# Patient Record
Sex: Female | Born: 1965 | Race: White | Hispanic: No | Marital: Married | Smoking: Never smoker
Health system: Southern US, Community
[De-identification: ages and names within clinical notes are randomized; demographics above are authoritative.]

## PROBLEM LIST (undated history)

## (undated) DIAGNOSIS — R4189 Other symptoms and signs involving cognitive functions and awareness: Secondary | ICD-10-CM

## (undated) DIAGNOSIS — Z87898 Personal history of other specified conditions: Secondary | ICD-10-CM

## (undated) DIAGNOSIS — G8929 Other chronic pain: Secondary | ICD-10-CM

## (undated) DIAGNOSIS — G43909 Migraine, unspecified, not intractable, without status migrainosus: Secondary | ICD-10-CM

## (undated) DIAGNOSIS — K649 Unspecified hemorrhoids: Secondary | ICD-10-CM

## (undated) DIAGNOSIS — D869 Sarcoidosis, unspecified: Secondary | ICD-10-CM

## (undated) DIAGNOSIS — C8590 Non-Hodgkin lymphoma, unspecified, unspecified site: Secondary | ICD-10-CM

## (undated) DIAGNOSIS — F419 Anxiety disorder, unspecified: Secondary | ICD-10-CM

## (undated) DIAGNOSIS — F488 Other specified nonpsychotic mental disorders: Secondary | ICD-10-CM

## (undated) HISTORY — DX: Personal history of other specified conditions: Z87.898

## (undated) HISTORY — PX: PERINEOPLASTY: SHX2218

## (undated) HISTORY — DX: Unspecified hemorrhoids: K64.9

## (undated) HISTORY — DX: Anxiety disorder, unspecified: F41.9

## (undated) HISTORY — PX: THORACOSCOPY: SUR1347

## (undated) HISTORY — PX: DILATION AND CURETTAGE OF UTERUS: SHX78

## (undated) HISTORY — DX: Migraine, unspecified, not intractable, without status migrainosus: G43.909

## (undated) HISTORY — DX: Other chronic pain: G89.29

## (undated) HISTORY — PX: WISDOM TOOTH EXTRACTION: SHX21

## (undated) HISTORY — DX: Other symptoms and signs involving cognitive functions and awareness: R41.89

## (undated) HISTORY — PX: CHOLECYSTECTOMY: SHX55

## (undated) HISTORY — PX: TONSILLECTOMY AND ADENOIDECTOMY: SUR1326

---
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
## 2003-05-24 ENCOUNTER — Ambulatory Visit (HOSPITAL_COMMUNITY): Admission: RE | Admit: 2003-05-24 | Discharge: 2003-05-24 | Payer: Self-pay | Admitting: Otolaryngology

## 2003-08-24 ENCOUNTER — Other Ambulatory Visit: Admission: RE | Admit: 2003-08-24 | Discharge: 2003-08-24 | Payer: Self-pay | Admitting: Obstetrics and Gynecology

## 2003-12-15 ENCOUNTER — Ambulatory Visit (HOSPITAL_COMMUNITY): Admission: RE | Admit: 2003-12-15 | Discharge: 2003-12-15 | Payer: Self-pay | Admitting: Neurology

## 2004-07-15 ENCOUNTER — Emergency Department (HOSPITAL_COMMUNITY): Admission: EM | Admit: 2004-07-15 | Discharge: 2004-07-15 | Payer: Self-pay | Admitting: Family Medicine

## 2005-04-13 ENCOUNTER — Other Ambulatory Visit: Admission: RE | Admit: 2005-04-13 | Discharge: 2005-04-13 | Payer: Self-pay | Admitting: Obstetrics and Gynecology

## 2005-05-28 ENCOUNTER — Ambulatory Visit (HOSPITAL_BASED_OUTPATIENT_CLINIC_OR_DEPARTMENT_OTHER): Admission: RE | Admit: 2005-05-28 | Discharge: 2005-05-28 | Payer: Self-pay | Admitting: Internal Medicine

## 2005-06-04 ENCOUNTER — Ambulatory Visit: Payer: Self-pay | Admitting: Internal Medicine

## 2005-07-20 ENCOUNTER — Ambulatory Visit: Payer: Self-pay | Admitting: Pulmonary Disease

## 2005-09-09 ENCOUNTER — Ambulatory Visit (HOSPITAL_BASED_OUTPATIENT_CLINIC_OR_DEPARTMENT_OTHER): Admission: RE | Admit: 2005-09-09 | Discharge: 2005-09-09 | Payer: Self-pay | Admitting: Pulmonary Disease

## 2005-09-23 ENCOUNTER — Ambulatory Visit: Payer: Self-pay | Admitting: Pulmonary Disease

## 2005-09-27 ENCOUNTER — Ambulatory Visit: Payer: Self-pay | Admitting: Pulmonary Disease

## 2005-11-19 ENCOUNTER — Emergency Department (HOSPITAL_COMMUNITY): Admission: EM | Admit: 2005-11-19 | Discharge: 2005-11-19 | Payer: Self-pay | Admitting: Family Medicine

## 2005-11-26 ENCOUNTER — Ambulatory Visit: Payer: Self-pay | Admitting: Pulmonary Disease

## 2006-02-26 ENCOUNTER — Ambulatory Visit: Payer: Self-pay | Admitting: Pulmonary Disease

## 2006-03-27 ENCOUNTER — Ambulatory Visit: Payer: Self-pay | Admitting: Pulmonary Disease

## 2006-05-13 ENCOUNTER — Ambulatory Visit (HOSPITAL_COMMUNITY): Admission: RE | Admit: 2006-05-13 | Discharge: 2006-05-13 | Payer: Self-pay | Admitting: Neurosurgery

## 2006-06-11 HISTORY — PX: SHOULDER ARTHROSCOPY: SHX128

## 2006-07-02 ENCOUNTER — Encounter: Admission: RE | Admit: 2006-07-02 | Discharge: 2006-07-02 | Payer: Self-pay | Admitting: Obstetrics and Gynecology

## 2006-12-18 ENCOUNTER — Ambulatory Visit: Payer: Self-pay | Admitting: Pulmonary Disease

## 2006-12-18 LAB — CONVERTED CEMR LAB
ALT: 15 units/L (ref 0–35)
AST: 17 units/L (ref 0–37)
Albumin: 3.7 g/dL (ref 3.5–5.2)
Alkaline Phosphatase: 38 units/L — ABNORMAL LOW (ref 39–117)
Angiotensin 1 Converting Enzyme: 26 units/L (ref 9–67)
BUN: 9 mg/dL (ref 6–23)
Basophils Absolute: 0 10*3/uL (ref 0.0–0.1)
Basophils Relative: 0.3 % (ref 0.0–1.0)
Bilirubin, Direct: 0.1 mg/dL (ref 0.0–0.3)
CO2: 30 meq/L (ref 19–32)
Calcium: 9.8 mg/dL (ref 8.4–10.5)
Chloride: 105 meq/L (ref 96–112)
Creatinine, Ser: 0.7 mg/dL (ref 0.4–1.2)
Eosinophils Absolute: 0.1 10*3/uL (ref 0.0–0.6)
Eosinophils Relative: 0.8 % (ref 0.0–5.0)
Ferritin: 37.6 ng/mL (ref 10.0–291.0)
GFR calc Af Amer: 119 mL/min
GFR calc non Af Amer: 99 mL/min
Glucose, Bld: 102 mg/dL — ABNORMAL HIGH (ref 70–99)
HCT: 41.6 % (ref 36.0–46.0)
Hemoglobin: 14.3 g/dL (ref 12.0–15.0)
Iron: 99 ug/dL (ref 42–145)
Lymphocytes Relative: 26 % (ref 12.0–46.0)
MCHC: 34.3 g/dL (ref 30.0–36.0)
MCV: 88.3 fL (ref 78.0–100.0)
Monocytes Absolute: 0.6 10*3/uL (ref 0.2–0.7)
Monocytes Relative: 7.9 % (ref 3.0–11.0)
Neutro Abs: 4.9 10*3/uL (ref 1.4–7.7)
Neutrophils Relative %: 65 % (ref 43.0–77.0)
Platelets: 240 10*3/uL (ref 150–400)
Potassium: 3.4 meq/L — ABNORMAL LOW (ref 3.5–5.1)
RBC: 4.7 M/uL (ref 3.87–5.11)
RDW: 11.8 % (ref 11.5–14.6)
Saturation Ratios: 17.7 % — ABNORMAL LOW (ref 20.0–50.0)
Sodium: 136 meq/L (ref 135–145)
TSH: 1.51 microintl units/mL (ref 0.35–5.50)
Total Bilirubin: 0.8 mg/dL (ref 0.3–1.2)
Total Protein: 7.2 g/dL (ref 6.0–8.3)
Transferrin: 399.4 mg/dL — ABNORMAL HIGH (ref >=212.0)
WBC: 7.6 10*3/uL (ref 4.5–10.5)

## 2007-02-07 ENCOUNTER — Ambulatory Visit: Payer: Self-pay | Admitting: Gastroenterology

## 2007-02-12 ENCOUNTER — Ambulatory Visit: Payer: Self-pay | Admitting: Cardiovascular Disease

## 2007-02-13 ENCOUNTER — Ambulatory Visit: Payer: Self-pay | Admitting: Gastroenterology

## 2007-02-18 ENCOUNTER — Ambulatory Visit: Payer: Self-pay | Admitting: Gastroenterology

## 2007-03-10 ENCOUNTER — Ambulatory Visit: Payer: Self-pay | Admitting: Gastroenterology

## 2007-03-11 ENCOUNTER — Encounter: Payer: Self-pay | Admitting: Gastroenterology

## 2007-03-11 ENCOUNTER — Ambulatory Visit: Payer: Self-pay | Admitting: Gastroenterology

## 2007-03-18 ENCOUNTER — Ambulatory Visit: Payer: Self-pay | Admitting: Gastroenterology

## 2007-03-19 ENCOUNTER — Ambulatory Visit: Payer: Self-pay | Admitting: Gastroenterology

## 2007-03-19 ENCOUNTER — Encounter: Payer: Self-pay | Admitting: Gastroenterology

## 2007-03-27 ENCOUNTER — Encounter (INDEPENDENT_AMBULATORY_CARE_PROVIDER_SITE_OTHER): Payer: Self-pay | Admitting: Interventional Radiology

## 2007-03-27 ENCOUNTER — Ambulatory Visit (HOSPITAL_COMMUNITY): Admission: RE | Admit: 2007-03-27 | Discharge: 2007-03-27 | Payer: Self-pay | Admitting: Gastroenterology

## 2007-04-02 DIAGNOSIS — D869 Sarcoidosis, unspecified: Secondary | ICD-10-CM

## 2007-04-02 DIAGNOSIS — G4700 Insomnia, unspecified: Secondary | ICD-10-CM

## 2007-04-02 DIAGNOSIS — G2581 Restless legs syndrome: Secondary | ICD-10-CM

## 2007-04-29 ENCOUNTER — Ambulatory Visit: Payer: Self-pay | Admitting: Gastroenterology

## 2007-06-13 ENCOUNTER — Ambulatory Visit: Payer: Self-pay | Admitting: Gastroenterology

## 2007-06-13 DIAGNOSIS — R112 Nausea with vomiting, unspecified: Secondary | ICD-10-CM

## 2007-07-11 DIAGNOSIS — K5909 Other constipation: Secondary | ICD-10-CM | POA: Insufficient documentation

## 2007-07-11 DIAGNOSIS — R1314 Dysphagia, pharyngoesophageal phase: Secondary | ICD-10-CM

## 2007-08-08 ENCOUNTER — Ambulatory Visit (HOSPITAL_COMMUNITY): Admission: RE | Admit: 2007-08-08 | Discharge: 2007-08-08 | Payer: Self-pay | Admitting: Obstetrics and Gynecology

## 2007-10-10 ENCOUNTER — Ambulatory Visit (HOSPITAL_COMMUNITY): Admission: RE | Admit: 2007-10-10 | Discharge: 2007-10-10 | Payer: Self-pay | Admitting: Neurosurgery

## 2007-12-22 ENCOUNTER — Ambulatory Visit (HOSPITAL_COMMUNITY): Admission: RE | Admit: 2007-12-22 | Discharge: 2007-12-22 | Payer: Self-pay | Admitting: Orthopedic Surgery

## 2008-03-11 ENCOUNTER — Ambulatory Visit (HOSPITAL_COMMUNITY): Admission: RE | Admit: 2008-03-11 | Discharge: 2008-03-11 | Payer: Self-pay | Admitting: Orthopedic Surgery

## 2008-03-26 ENCOUNTER — Encounter: Admission: RE | Admit: 2008-03-26 | Discharge: 2008-05-11 | Payer: Self-pay | Admitting: Orthopedic Surgery

## 2008-05-21 ENCOUNTER — Ambulatory Visit (HOSPITAL_COMMUNITY): Admission: RE | Admit: 2008-05-21 | Discharge: 2008-05-21 | Payer: Self-pay | Admitting: Neurosurgery

## 2008-06-18 ENCOUNTER — Ambulatory Visit (HOSPITAL_COMMUNITY): Admission: RE | Admit: 2008-06-18 | Discharge: 2008-06-18 | Payer: Self-pay | Admitting: Internal Medicine

## 2008-07-27 ENCOUNTER — Ambulatory Visit (HOSPITAL_COMMUNITY): Admission: RE | Admit: 2008-07-27 | Discharge: 2008-07-27 | Payer: Self-pay | Admitting: Orthopedic Surgery

## 2008-08-05 ENCOUNTER — Ambulatory Visit (HOSPITAL_COMMUNITY): Admission: RE | Admit: 2008-08-05 | Discharge: 2008-08-05 | Payer: Self-pay | Admitting: Neurosurgery

## 2008-08-09 ENCOUNTER — Encounter: Admission: RE | Admit: 2008-08-09 | Discharge: 2008-08-09 | Payer: Self-pay | Admitting: Neurosurgery

## 2008-12-27 ENCOUNTER — Ambulatory Visit (HOSPITAL_COMMUNITY): Admission: RE | Admit: 2008-12-27 | Discharge: 2008-12-27 | Payer: Self-pay | Admitting: Neurology

## 2009-03-16 ENCOUNTER — Encounter: Admission: RE | Admit: 2009-03-16 | Discharge: 2009-03-16 | Payer: Self-pay | Admitting: Internal Medicine

## 2010-07-02 ENCOUNTER — Encounter: Payer: Self-pay | Admitting: Obstetrics and Gynecology

## 2010-07-02 ENCOUNTER — Encounter: Payer: Self-pay | Admitting: Orthopedic Surgery

## 2010-07-03 ENCOUNTER — Encounter: Payer: Self-pay | Admitting: Gastroenterology

## 2010-08-25 ENCOUNTER — Other Ambulatory Visit: Payer: Self-pay | Admitting: Obstetrics and Gynecology

## 2010-08-25 DIAGNOSIS — Z1231 Encounter for screening mammogram for malignant neoplasm of breast: Secondary | ICD-10-CM

## 2010-09-06 ENCOUNTER — Ambulatory Visit: Payer: Self-pay

## 2010-09-11 ENCOUNTER — Ambulatory Visit: Payer: Self-pay

## 2010-09-21 ENCOUNTER — Ambulatory Visit
Admission: RE | Admit: 2010-09-21 | Discharge: 2010-09-21 | Disposition: A | Discharge status: Home / Self Care | Payer: Commercial Managed Care - PPO | Source: Ambulatory Visit | Attending: Obstetrics and Gynecology | Admitting: Obstetrics and Gynecology

## 2010-09-21 DIAGNOSIS — Z1231 Encounter for screening mammogram for malignant neoplasm of breast: Secondary | ICD-10-CM

## 2010-10-24 NOTE — Discharge Summary (Signed)
NAME:  [name] [name], [name] [name]            ACCOUNT NO.:  [id number]   MEDICAL RECORD NO.:  [id number]          PATIENT TYPE:  AMB   LOCATION:  SDC                           FACILITY:  [facility]   PHYSICIAN:  Lenoard Aden, M.D.DATE OF BIRTH:  Jul 25, 1965   DATE OF ADMISSION:  08/08/2007  DATE OF DISCHARGE:  08/08/2007                               DISCHARGE SUMMARY   ADMISSION DIAGNOSES:  Vulvar scarring, secondary dyspareunia, vaginal  stenosis.   POSTOPERATIVE DIAGNOSES:  Vulvar scarring, secondary dyspareunia,  vaginal stenosis.   HOSPITAL COURSE:  The patient underwent uncomplicated vulvoplasty with  perineorrhaphy and vaginoplasty.  Subsequent to termination of the  procedure upon leaving the operating room, I was called back to the  operating room for persistent bleeding from the left lateral portion of  the vaginoplasty repair.  At this time, it was noted that there was a  small hematoma forming beneath.  This was appropriately repaired after  removing sutures on the left side and re-closing the external portion of  the left portion of the vaginal vulvoplasty procedure.  At this time,  good hemostasis was noted.  General anesthesia was then discontinued.  The patient tolerated the procedure well and was transferred to recovery  in good condition.  No complications noted.      Lenoard Aden, M.D.  Electronically Signed     RJT/MEDQ  D:  09/28/2007  T:  09/29/2007  Job:  [id number]   cc:   [facility]  [address]  [place], [place] [address]

## 2010-10-24 NOTE — Assessment & Plan Note (Signed)
[facility]                         GASTROENTEROLOGY OFFICE NOTE   NAME:[name] [name], [name] [name]                   MRN:          [id number]  DATE:04/29/2007                            DOB:          1965/10/13    PRIMARY CARE PHYSICIAN:  Gaspar Garbe, M.D.   GI PROBLEM LIST:  :  Nausea, vomiting, intermittent dysphagia, globus  sensation.  This constellation of symptoms dates back many years, where  she was evaluated by an outside gastroenterologist with pH testing,  esophageal manometry, EGDs, trials of multiple PPIs.  None helped.  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  A tTG was  normal.  Angiotensin-converting enzyme was normal.  CT scan with IV and  oral contrast suggested mild thickening of the gastric antrum and  hepatomegaly (liver measuring up to 20 cm).  Flex sig with rectal  biopsies to check for amyloidosis were negative.  EGD of March 11, 2007 was normal.  Biopsies of the duodenum were normal.  Core liver  biopsy of very large liver showed minimal portal inflammation.  No  granulomas.  Essentially fairly nonspecific findings.   INTERVAL HISTORY:  I last saw [name] [name] a month and a half ago.  Since  then, she states that many of her symptoms seem to be improving.  She  has recently started on Zoloft and believes that is really helping her  out with a lot of work stress and life stressors.  She does have a  rather chronic constipation, which has been an issue since she was a  young girl.  She vomits about once a week.   PHYSICAL EXAMINATION:  Weight 95 pounds, which is up 4 pounds since her  last visit.  Blood pressure 80/56, pulse 72.  CONSTITUTIONAL:  Think otherwise well-appearing.  ABDOMEN:  Soft, nontender, nondistended.  Normal bowel sounds.   ASSESSMENT/PLAN:  A 45 year old woman with multiple gastrointestinal  symptoms.  Most seem to be resolved, probably functional.   I recommended that she begin  taking MiraLax.  She will start with 3  scoops today, 3 scoops tomorrow, then 1 scoop a day.  She will return to  see me in 6-8 weeks or sooner if needed.  She really feels much better  since starting the Zoloft overall.     [name], MD  Electronically Signed    DPJ/MedQ  DD: 04/29/2007  DT: 04/30/2007  Job #: [id number]   cc:   Gaspar Garbe, M.D.

## 2010-10-24 NOTE — Op Note (Signed)
NAME:  [name] [name], [name] [name]            ACCOUNT NO.:  [id number]   MEDICAL RECORD NO.:  [id number]          PATIENT TYPE:  AMB   LOCATION:  SDS                          FACILITY:  [facility]   PHYSICIAN:  Vania Rea. Supple, M.D.  DATE OF BIRTH:  December 13, 1965   DATE OF PROCEDURE:  03/11/2008  DATE OF DISCHARGE:                               OPERATIVE REPORT   PREOPERATIVE DIAGNOSES:  1. Chronic right shoulder impingement syndrome.  2. 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   POSTOPERATIVE DIAGNOSES:  1. Chronic right shoulder impingement syndrome.  2. 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   PROCEDURES:  1. Right shoulder examination under anesthesia.  2. Right shoulder diagnostic arthroscopy.  3. Arthroscopic subacromial depression.  4. Arthroscopic distal clavicle resection.   SURGEON:  Vania Rea. Supple, MD   ASSISTANT:  Lucita Lora. Shuford, PA-C.   ANESTHESIA:  General endotracheal as well as a preoperative interscalene  block.   ESTIMATED BLOOD LOSS:  Minimal.   DRAINS:  None.   HISTORY:  Mr. Sanzo is 45 year old female whose has had chronic right  shoulder pain with impingement syndrome, symptomatic AC joint arthrosis  with symptoms that had been refractory to prolonged attempts at  conservative management.  Due to her ongoing pain and functional  limitations, she is brought to the operating room at this time for  planned right shoulder arthroscopy as described below.   Preoperatively counseled Ms. Orne on treatment options as well as  risks and benefits thereof.  Possible surgical complications including  infection, neurovascular injury, persistent pain, loss of motion,  anesthetic complication, and possible need for additional surgery are  reviewed.  She understands and accepts and agrees with our planned  procedure.   PROCEDURE IN DETAIL:  After undergoing routine preop evaluation, the  patient received prophylactic  antibiotics.  An interscalene block was  established in the holding area by the Anesthesia Department.  Placed  supine on the operative table and underwent smooth induction of general  endotracheal anesthesia, turned to the left lateral decubitus position  on a beanbag and appropriately padded and protected.  Right  shoulder  examination under anesthesia revealed full passive motion.  There are no  obvious instability pattern.  The right arm was suspended at 70 degrees,  abduction with 10 pounds of traction.  The right shoulder girdle region  was sterilely prepped and draped in the standard fashion.  Posterior  portal was established in the glenohumeral joint and diagnostic  arthroscopy was performed.  The glenohumeral articular surfaces were in  pristine condition.  The rotator cuff was carefully inspected and found  to be intact with no obvious degenerative changes.  There are no  instability patterns noted.  The biceps tendon showed normal caliber and  good quality tissue and no evidence for instability either proximally or  distally.  Fluid and instruments then were removed from glenohumeral  joint.  The arm was dropped down to 30 degrees of abduction and the  arthroscope introduced in the subacromial space from posterior portal,  direct lateral portal was established in the subacromial space.  Abundant proliferative bursal tissue with  multiple adhesions were  identified.  These were divided and excised with a combination of the  shaver and the Arthrex wand.  The wand was then used to remove the  periosteum from the undersurface of the anterior half of the acromion  and then a subacromial decompression was performed with a bur removing a  just very thin margin of bone, 1 or 2 mm from the anterior aspect of the  acromion, creating a type 1 morphology.  We then established a portal  directly anterior to distal clavicle.  Distal clavicle resection was  performed with a bur.  Care was  taken to make sure the entire  circumference.  Distal clavicle could be visualized to assure adequate  removal of bone.  We then completed a subacromial/subdeltoid bursectomy.  The bursal surface of the rotator cuff was inspected and probed.  It is  found to be intact, was very minimal bursal-sided frank.  Final  hemostasis was obtained.  Fluid and instruments were removed.  The  portals was closed with Monocryl and Steri-Strips.  A bulky dry dressing  taped with the right shoulder and arm was placed in a sling immobilizer.  The patient was supine, extubated, and taken to recovery room in stable  condition.      Vania Rea. Supple, M.D.  Electronically Signed     KMS/MEDQ  D:  03/11/2008  T:  03/12/2008  Job:  [id number]

## 2010-10-24 NOTE — Assessment & Plan Note (Signed)
[facility]                         GASTROENTEROLOGY OFFICE NOTE   [name], [name] TOUCH                   MRN:          [id number]  DATE:03/10/2007                            DOB:          1965/08/26    PRIMARY CARE PHYSICIAN:  Dr. Guerry Bruin.   GI PROBLEM LIST:  Nausea, vomiting, intermittent dysphagia, globus  sensation.  This constellation of symptoms dates back many years, where  she was evaluated by an outside gastroenterologist with pH testing,  esophageal manometry, EGDs, trials of multiple PPIs.  None helped.  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  A tTG was  normal.  Angiotensin-converting enzyme was normal.  CT scan with IV and  oral contrast suggested mild thickening of the gastric antrum and  hepatomegaly (liver measuring up to 20 cm).   INTERVAL HISTORY:  I last saw [name] [name] 3-4 weeks ago.  Since then,  she is continuing to have vomiting.  She says coughing usually tends to  bring this on.  She tried stopping Yaz and citalopram but has not  noticed any significant difference after stopping those.   CURRENT MEDICINES:  1. Zoloft.  2. Trazodone.  3. Ambien.  4. Nexium.   PHYSICAL EXAMINATION:  Weight 91.4 pounds, blood pressure 74/48 (we are  rechecking that now), pulse 70.  CONSTITUTIONAL:  Alert and oriented x3.  Generally well appearing  although thin.  ABDOMEN:  Soft, nontender, nondistended, normal bowel sounds.   ASSESSMENT AND PLAN:  A 45 year old woman with vomiting, globus,  constellation of gastrointestinal symptoms, hepatomegaly on recent CT  scan.  Workup has been fairly unrevealing to date.  I will arrange for  her to have an esophagogastroduodenoscopy done in the next 1-2 days.  She does have quite a large liver on recent CT scan (20 cm), and if the  esophagogastroduodenoscopy does not show any obvious cause of her  symptoms, then we will have to consider liver biopsy.  Perhaps she  has  an infiltrative disorder.     [name], MD  Electronically Signed    DPJ/MedQ  DD: 03/10/2007  DT: 03/10/2007  Job #: [id number]   cc:   Gaspar Garbe, M.D.

## 2010-10-24 NOTE — Op Note (Signed)
NAME:  [name] [name], [name] [name]            ACCOUNT NO.:  [id number]   MEDICAL RECORD NO.:  [id number]          PATIENT TYPE:  AMB   LOCATION:  SDC                           FACILITY:  [facility]   PHYSICIAN:  Lenoard Aden, M.D.DATE OF BIRTH:  [birth year]   DATE OF PROCEDURE:  08/08/2007  DATE OF DISCHARGE:                               OPERATIVE REPORT   PREOPERATIVE DIAGNOSES:  1. Vulvar and introital scarring with secondary dyspareunia.  2. Vaginal stenosis.   POSTOPERATIVE DIAGNOSES:  1. Vulvar and introital scarring with secondary dyspareunia.  2. Vaginal stenosis.   PROCEDURE:  Vulvoplasty with perineorrhaphy, vaginoplasty.   SURGEON:  Lenoard Aden, M.D.   ANESTHESIA:  General.   ESTIMATED BLOOD LOSS:  One hundred mL.   COMPLICATIONS:  None.   DRAINS:  None.   COUNTS:  Correct.   DISPOSITION:  The patient to recovery in good condition.   PROCEDURE IN DETAIL:  After being apprised of risks of anesthesia,  infection, bleeding, injury to abdominal organs, need for repair delayed  immediate, complications to include bowel or bladder injury with  possible need for repair, the patient was taken to the operating room  where she was administered general anesthetic without complications,  prepped and draped in usual sterile fashion, catheterized until the  bladder is empty.  After achieving adequate anesthesia, the area of  scarring is outlined.  Vag and Allis clamps were placed bilaterally to  outline to put the midline scarring area in the vulva under tension.  A  V shaped incision is made to excise this midline scar tissue which is  then excised in total.  The vagina is then undermined in the midline  under the rectovaginal space and good hemostasis is noted.  At this time  the deep tissue is closed and a perineorrhaphy is performed using a 3-0  Vicryl Rapide in the standard fashion.  Lateral sutures along the  relaxing incision made in the vulvar area are made using a 3-0  Vicryl  Rapide suture and good hemostasis is noted.  At this time after  repairing the vulvar and perineorrhaphy there is an area of stenosis  along the left lateral vaginal wall which is outlined using Allis clamps  in a cephalad to caudad fashion.  A vertical relaxing incision is made.  The vagina is undermined and this area is then closed in interrupted 3-0  Vicryl Rapide sutures in a transverse fashion using interrupted sutures.  Good hemostasis is noted,  good relaxation of the vaginal stenosis and vaginal scarring is noted  after the procedure.  The patient tolerated the procedure well and is  transferred to recovery room in good condition after placement of a  dilute 0.5% Marcaine solution 10 mL total in the area of the surgery.      Lenoard Aden, M.D.  Electronically Signed     RJT/MEDQ  D:  08/08/2007  T:  08/09/2007  Job:  [id number]

## 2010-10-24 NOTE — Assessment & Plan Note (Signed)
[facility]                             PULMONARY OFFICE NOTE   NAME:Bourn, SHAYNE DEERMAN                   MRN:          [id number]  DATE:12/18/2006                            DOB:          07/20/1965    I saw Ms. Foister in follow up for her sleep onset insomnia, confusional  arousals, restless legs syndrome, and pulmonary sarcoidosis. She says  that since her last visit with me, her sleep has been reasonably okay.  Her main complaint today is that she has been having problems feeling  excessively thirsty, tired during the day, but not actually sleepy. She  has also been having an increase in appetite with increased frequency of  urination. She has also lost approximately 10 pounds from a year ago.  She has occasional chills and has also noticed that she has been having  problems with allergies recently. 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 She says that when she  takes her sleeping medications, if she is not actually in bed, her  husband has noticed that she has done some unusual things that she does  not recall later, but that if she is actually in bed when she takes her  medications, these events do not occur.   CURRENT MEDICATIONS:  1. Yasmin once daily.  2. Lexapro 10 mg daily.  3. Multivitamin daily.  4. Rhinocort nasal spray as needed.  5. Ambien 10 mg nightly.  6. Trazodone 50 mg nightly.  7. Klonopin 0.5 mg as needed.   PHYSICAL EXAMINATION:  VITAL SIGNS:  Weight 96 pounds, temperature 98.8,  blood pressure 94/64, heart rate 97, oxygen saturation 98% on room air.  HEENT:  There was no sinus tenderness, no nasal discharge, no oral  lesions.  NECK:  No lymphadenopathy.  HEART:  S1, S2.  CHEST:  Clear to auscultation.  ABDOMEN:  Thin, soft, nontender.  EXTREMITIES:  No edema.   IMPRESSION:  1. History of  pulmonary sarcoidosis with recurrent symptoms of weight      loss, thirst, fatigue, polyuria, and an increase in appetite in      spite of the weight of loss. I would like to make sure that she is      not having a recurrence of her pulmonary sarcoidosis. I will      arrange for her to undergo a chest x-ray, as well as laboratory      tests and then she says that she will also arrange for a follow up      with Dr. Wylene Simmer to further evaluate this as well.  2. Sleep onset insomnia. She appears to be doing reasonably well on      her current regimen of Trazodone and Ambien and I will continue her      on this.  3. Confusional arousals. Again, this is likely related to the use of      her Ambien. I have advised her to maintain a safe sleep  environment.  4. Restless legs syndrome. She has noticed that her symptoms are      slightly worse. I will have her have a serum ferritin level check      and if this is below 50, then we may need to reinitiate her on      Ferrous sulfate with vitamin C.   I will follow up with her in approximately six weeks.     Coralyn Helling, MD  Electronically Signed    VS/MedQ  DD: 12/22/2006  DT: 12/23/2006  Job #: [id number]   cc:   Gaspar Garbe, M.D.

## 2010-10-24 NOTE — Assessment & Plan Note (Signed)
[facility]                         GASTROENTEROLOGY OFFICE NOTE   NAME:[name] [name], [name] [name]                   MRN:          [id number]  DATE:06/13/2007                            DOB:          06/23/1965    PRIMARY CARE PHYSICIAN:  Gaspar Garbe, M.D.   GI PROBLEM LIST:  1. Nausea, vomiting, intermittent dysphagia, globus sensation.  This      constellation of symptoms dates back many years, where she was      evaluated by an outside gastroenterologist with pH testing,      esophageal manometry, EGDs, trials of multiple PPIs.  None helped.      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  A tTG      was normal.  Angiotensin-converting enzyme was normal.  CT scan      with IV and oral contrast suggested mild thickening of the gastric      antrum and hepatomegaly (liver measuring up to 20 cm).  Flex sig      with rectal biopsies to check for amyloidosis were negative.  EGD      of March 11, 2007 was normal.  Biopsies of the duodenum were      normal.  Core liver biopsy of very large liver showed minimal      portal inflammation.  No granulomas.  Essentially fairly      nonspecific findings.  2. Constipation.  Chronic constipation, improved with MiraLax daily.   INTERVAL HISTORY:  I last saw [name] [name] six weeks ago.  Since then, she  has been on MiraLax daily and has noticed a definite improvement in her  chronic constipation.  She moves her bowels once or twice a day, does  not really have to strain as much as she was before.  It is not a  dramatic improvement, but she definitely feels better.  She still vomits  once every week or two.  She thinks this is usually triggered by certain  smells, sometimes a gagging sensation in the back of her throat.  Overall, her spirits are much improved since she began taking Zoloft two  to three months ago.   PHYSICAL EXAM:  Weight 92.4 pounds, which is down 2.5 pounds since her  last  visit, fairly stable over several months.  Blood pressure 78/50,  pulse 76.  CONSTITUTIONAL:  Thin, otherwise well-appearing.  NEUROLOGIC:  Alert and oriented times three.  ABDOMEN:  Soft, nontender, nondistended.  Normal bowel sounds.   ASSESSMENT AND PLAN:  Patient is a 45 year old woman with multiple GI  symptoms, most seem to be resolved and are likely functional.  She will  stay on MiraLax one to one and a half scoops a day for now.  She knows  to get in touch with me if any new GI issues come up.     [name], MD  Electronically Signed    DPJ/MedQ  DD: 06/13/2007  DT: 06/13/2007  Job #: [id number]   cc:   Gaspar Garbe, M.D.

## 2010-10-24 NOTE — Assessment & Plan Note (Signed)
[facility]                         GASTROENTEROLOGY OFFICE NOTE   [name], [name] [name]                   MRN:          [id number]  DATE:02/07/2007                            DOB:          [birth year]    REFERRING PHYSICIAN:  Gaspar Garbe, M.D.   REASON FOR REFERRAL:  Dr. Wylene Simmer asked me to evaluate [name] [name] in  consultation regarding vomiting, diarrhea, and weight loss.   HISTORY OF PRESENT ILLNESS:  [name] [name] is a very pleasant 45 year old  woman who has a history of pulmonary sarcoid.  She initially presented 7-  8 years ago in [place] with predominantly gastrointestinal type  symptoms.  She had a globus sensation with nausea, vomiting, and some  dysphagia.  She had extensive GI workup including an EGD that she tells  me was normal.  She had a pH study that looks fairly normal to my review  (this was done off medicines).  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  She was tried at that point on multiple PPIs, Reglan, I  believe calcium channel blockers for these nonspecific motility  findings.  Nothing really helped.  She eventually had a chest CT that  revealed mediastinal adenopathy.  This was worked up, and she was found  to have pulmonary sarcoid.  She had a brief course of steroids, and over  time all her various symptoms resolved.  It has been several years since  that time, and over the past 2-3 months she has had a return of many of  these initial GI symptoms of a globus-type sensation, dysphagia.  She  feels a lump in her upper chest, and it causes some coughing and some  vomiting.  She feels a lump usually when she swallows.  This she says is  somewhat similar to her initial sarcoid symptoms.  She has also been  having intermittent diarrhea and has lost at least 10 pounds over  the  past 6-8 weeks.  Her appetite is decreasing.  She does admit she has  quite a stressful job and wonders if this is playing a role.  She has  had recent lab tests one month ago by Dr. Wylene Simmer showing a normal CBC,  a relatively normal complete metabolic profile, normal thyroid testing.   REVIEW OF SYSTEMS:  Notable for the weight loss as above.  Otherwise,  essentially normal and is available on her intake sheet.   PAST MEDICAL HISTORY:  1. History of pulmonary sarcoid, diagnosed in 2001.  Status post      cholecystectomy in 2001.  This was empirically done as part of her      initial workup.  2. Anxiety panic disorder.  3. Elevated cholesterol.   CURRENT MEDICATIONS:  Citalopram, clonazepam, Yaz, Nexium which she  takes before bed nightly, Ambien, and trazodone.   ALLERGIES:  1. REGLAN.  2. PENICILLIN.   SOCIAL HISTORY:  Married, with one 78-1/2 year-old daughter.  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  Nonsmoker, nondrinker.  Rare  caffeine.  Rare peppermint and chocolate.   FAMILY HISTORY:  Father with ulcerative colitis.  Father with  diverticulitis.   PHYSICAL EXAMINATION:  VITAL SIGNS:  Height 5 feet 1 inches, weight 91  pounds, blood pressure 80/60, pulse 68.  CONSTITUTIONAL:  Thin, otherwise well appearing.  NEUROLOGIC:  Alert and oriented x3.  HEENT:  Eyes:  Extraocular movements intact.  Mouth:  Oropharynx moist.  No lesions.  NECK:  Supple.  No lymphadenopathy.  CARDIOVASCULAR:  Heart:  Regular rate and rhythm.  LUNGS:  Clear to auscultation bilaterally.  ABDOMEN:  Soft, nontender, nondistended.  Normal bowel sounds.  EXTREMITIES:  No lower extremity edema.  SKIN:  No rashes or lesions on visible extremities.   ASSESSMENT AND PLAN:  A 45 year old woman with globus dysphagia,  vomiting, coughing, intermittent diarrhea, and chest pains.   I think I am most struck that many of these symptoms were present when  several years ago she underwent a  fairly exhaustive GI search without  firm diagnosis.  Several proton pump inhibitors, empiric  cholecystectomy, trials of Reglan, and calcium channel blockers did not  help.  She was eventually diagnosed with sarcoid.  I wonder if these  symptoms of hers are signs of worsening or recurrent sarcoid.  She did  have a chest x-ray recently by Dr. Coralyn Helling, whom she was seeing for  restless leg syndrome and some trouble sleeping.  Chest x-ray showed no  findings, but I will arrange for her to have a chest CT as well as  abdomen and pelvic CT with IV and oral contrast.  Sarcoid can affect not  just the lungs, but also the GI tract and cause lymphadenopathy  throughout the body.  She has had recent lab tests last week by Dr.  Wylene Simmer.  We will get those sent over here.  If not done, I will arrange  for her to have testing for celiac sprue.  I will also get an ACE level.  She does take Nexium on a daily basis, probably at an incorrect time in  relation to meals, and so she will start taking it 20-30 minutes prior  to her dinner meal.  We may need to proceed with EGD and/or colonoscopy.  I want to wait for the above testing to come back first.  She will  return to see me in 1-2 weeks, and sooner if needed.     [name], MD  Electronically Signed    DPJ/MedQ  DD: 02/07/2007  DT: 02/07/2007  Job #: [id number]   cc:   Coralyn Helling, MD  Gaspar Garbe, M.D.

## 2010-10-24 NOTE — Assessment & Plan Note (Signed)
[facility]                         GASTROENTEROLOGY OFFICE NOTE   NAME:[name] [name], [name] [name]                   MRN:          [id number]  DATE:02/18/2007                            DOB:          10/19/1965    GI PROBLEM LIST:  Nausea, vomiting, intermittent dysphagia, globus  sensation.  This constellation of symptoms dates back many years, where  she was evaluated by an outside gastroenterologist with pH testing,  esophageal manometry, EGDs, trials of multiple PPIs.  None helped.  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   INTERVAL HISTORY:  I last saw [name] [name] one to two weeks ago.  I  arranged for her to have some further lab testing.  A tTG was normal.  Her angiotensin-converting enzyme was normal.  She had a CT scan  performed, suggesting mild thickening of the gastric antrum and  hepatomegaly with the liver measuring up to 20 cm.  Reviewing her  symptoms again, she describes coughing in the morning with some vomiting  following that.  She does not have frank nausea, but does vomit on  nearly a daily basis.  She tells me today that one of her sisters has  celiac sprue.   CURRENT MEDICINES:  Citalopram, clonazepam, YAZ, Nexium, Ambien and  trazodone.   PHYSICAL EXAM:  Weight 90 pounds, down one pound since her last visit.  Blood pressure 92/60, pulse 64.  CONSTITUTIONAL:  Generally well-appearing.  ABDOMEN:  Soft, nontender, nondistended.  Normal bowel sounds.   ASSESSMENT AND PLAN:  [age]-year-old woman with vomiting, globus,  constellation of GI symptoms, hepatomegaly on recent CT scan.   Vomiting seems to be her number one complaint.  Two of her medicines  have a number one side effect profile of nausea and vomiting, that is  citalopram and YAZ.  I have recommended she get in touch with the  prescribing care-givers for each of these medicines and a trial off each  medicine for three weeks, at least.  She will return  to see me in six  weeks' time.  She does have quite a large liver and perhaps she has an  infiltrative disorder, amyloidosis.  Sarcoid can affect the liver, as  well.  She does have normal liver tests.  When she returns and she only  has partial or no relief from changing these medicines, then I would  probably proceed with an EGD first, but she may also need a liver  biopsy.  She will return to see me in five to six weeks and sooner if  needed.     [name], MD  Electronically Signed    DPJ/MedQ  DD: 02/18/2007  DT: 02/19/2007  Job #: [id number]   cc:   Gaspar Garbe, M.D.

## 2010-10-27 ENCOUNTER — Other Ambulatory Visit: Payer: Self-pay | Admitting: Obstetrics and Gynecology

## 2010-10-27 NOTE — Procedures (Signed)
NAME:  [name] [name], [name] [name]            ACCOUNT NO.:  [id number]   MEDICAL RECORD NO.:  [id number]          PATIENT TYPE:  OUT   LOCATION:  SLEEP CENTER                 FACILITY:  [facility]   PHYSICIAN:  Coralyn Helling, M.D.      DATE OF BIRTH:  Sep 28, 1965   DATE OF STUDY:  09/09/2005                              NOCTURNAL POLYSOMNOGRAM   REFERRING PHYSICIAN:  Dr. Coralyn Helling   INDICATION FOR STUDY:  Complaints of brain fog, waking up fatigued and  exhausted, tired all the time, with sleep disruption,  as well as complaints  of sleep paralysis, sleep hallucinations, and vivid dreams.   EPWORTH SLEEPINESS SCORE:  19   MEDICATIONS:  None listed, although the patient did use a Rozerem prior to  the study.   SLEEP ARCHITECTURE:  Total recording time is 466.5 minutes.  Total sleep  time was 416.5 minutes.  Sleep efficiency was 89%.  The patient was observed  in all stages of sleep.  However, there was a slight reduction in the  percentage of REM sleep at 19%.  Sleep latency was 17.5 minutes, which is  slightly prolonged.  REM latency was 210 minutes, which was significantly  prolonged.  The patient was observed in both the supine and nonsupine  position.   RESPIRATORY DATA:  The apnea/hypopnea index was 2.6.  The REM apnea/hypopnea  index was 2.9, and the non-REM apnea/hypopnea index was 0.9.  Occasional  snoring was noted by the technician.   OXYGEN DATA:  Oxygen saturation nadir was 88%.  The patient spent a total of  459 minutes with an oxygen saturation between 91% and 100%, and 1.2 minutes  with an oxygen saturation between 81% and 90%.   CARDIAC DATA:  EKG shows normal sinus rhythm.   MOVEMENT/PARASOMNIA:  The patient had one bathroom trip.  The periodic limb  movement index was 22.9, which is increased.   IMPRESSIONS/RECOMMENDATIONS:  This study shows evidence for a mild REM  predominance of sleep-disorder breathing.  The overall apnea/hypopnea index  was 2.6 with an oxygen  saturation nadir of 88%.  The majority of these  events occurred during REM sleep with a REM apnea/hypopnea index of 9.8.  Additionally, the periodic limb movement index was 22.9.  The patient should  be further questioned with regards to symptoms related to restless leg  syndrome.  Iron studies should be checked as well as B12 and folate, and if  these are low supplementation should be given.  Otherwise, consideration  should be given to starting the patient on treatment for  periodic limb movement disorder.  Additionally, if the patient remains  symptomatic, strong consideration should be given to additional therapy for  her REM-related sleep apnea.      Coralyn Helling, M.D.  Diplomat, Biomedical engineer of Sleep Medicine  Electronically Signed     VS/MEDQ  D:  09/23/2005 19:01:48  T:  09/24/2005 10:55:00  Job:  [id number]

## 2010-10-27 NOTE — Procedures (Signed)
NAME:  [name] [name], RHOME            ACCOUNT NO.:  [id number]   MEDICAL RECORD NO.:  [id number]          PATIENT TYPE:  OUT   LOCATION:  SLEEP CENTER                 FACILITY:  [facility]   PHYSICIAN:  Clinton D. Maple Hudson, M.D. DATE OF BIRTH:  03-20-1966   DATE OF STUDY:  05/28/2005                              NOCTURNAL POLYSOMNOGRAM   REFERRING PHYSICIAN:  Ewell Poe.   DATE OF STUDY:  May 28, 2005.   INDICATION FOR STUDY:  Hypersomnia with sleep apnea.   EPWORTH SLEEPINESS SCORE:  16/24.   BMI:  19.   WEIGHT:  105 pounds.   MEDICATIONS:  Lexapro, BCPs, Rozerem.   She is aware of leg jerks at night.   SLEEP ARCHITECTURE:  Total sleep time 424 minutes with sleep efficiency 95%.  Stage I was 7%, stage II 60%, stages III and IV 3%, REM 30% of total sleep  time. Sleep latency 7 minutes, REM latency 59 minutes, awake after sleep  onset 17 minutes, arousal index 27. No bedtime medication taken.   RESPIRATORY DATA:  Apnea hypopnea index (AHI, RDI) 0.3 obstructive events  per hour which is within normal limits (normal range from 0-5 per hour).  Only 1 central apnea and 1 obstructive apnea were recorded, both while lying  on the left side. REM AHI 0.9 per hour.   OXYGEN DATA:  Mild snoring with oxygen desaturation to a nadir of 93%. Mean  oxygen saturation through the study was 98% on room air.   CARDIAC DATA:  Normal sinus rhythm.   MOVEMENT/PARASOMNIAS:  A total of 112 limb jerks were recorded of which 36  were associated with arousal or awakening for periodic limb movement with  arousal index of 5.1 per hour, which is mildly increased.   IMPRESSION/RECOMMENDATIONS:  1.  Little suggestion of sleep disordered breathing with AHI of 0.3 per hour      and normal oxygenation, mild      snoring.  2.  Periodic limb movement with arousal, 5.1 per hour. Consider specific      therapy such as Requip or Mirapex.      Clinton D. Maple Hudson, M.D.  Diplomate, Biomedical engineer of Sleep  Medicine  Electronically Signed     CDY/MEDQ  D:  06/04/2005 12:52:57  T:  06/04/2005 19:49:54  Job:  [id number]

## 2010-10-27 NOTE — Procedures (Signed)
NAME:  [name] [name], [name] [name]            ACCOUNT NO.:  [id number]   MEDICAL RECORD NO.:  [id number]          PATIENT TYPE:  OUT   LOCATION:  SLEEP CENTER                 FACILITY:  [facility]   PHYSICIAN:  Coralyn Helling, M.D.      DATE OF BIRTH:  [birth year]   DATE OF STUDY:  09/09/2005                            MULTIPLE SLEEP LATENCY TEST   REFERRING PHYSICIAN:  Coralyn Helling, M.D.   INDICATIONS FOR PROCEDURE:  This is an individual who had complaints of  excessive daytime sleepiness, fatigue, sleep paralysis and sleep  hallucinations.  She had undergone an overnight polysomnogram the prior  night which she did have greater than six hours of sleep and she now  undergoes a multiple sleep latency test to evaluate the degree of her  hypersomnolence as well as to evaluate for the possibility of narcolepsy.   EPWORTH SLEEPINESS SCORE:  19.   BMI:  19.   MEDICATIONS:  None.  Although, the patient did take Rozerem on the prior  night before her overnight polysomnogram.   Nap Times:              Sleep Latency:                REM Latency:  1)  0800                      19.5 minutes.                       No REM  sleep observed.  2)  1000                      11 minutes.                   No REM sleep  observed.  3)  1200                      17.5 minutes.                       No REM  sleep observed.  4)  1400                      4 minutes.                    No REM sleep  observed.  5)  1600                      17.5 minutes.                       No REM  sleep observed.    MEAN SLEEP LATENCY:  13.9 minutes.   NUMBER OF REM EPISODES:  No REM episodes were observed.   COMMENTS:  The typical protocol of continuing the sleep period for an  additional 15 minutes after sleep onset was not done and therefore this is  an inadequate study to evaluate the possibility of sleep onset REM.   IMPRESSIONS-RECOMMENDATIONS:  The overall  sleep latency was 13.9 minutes  which is not suggestive of excessive  daytime sleepiness; however, clinical  correlation would be necessary to further determine the significance of  this.  Additionally, given the fact that the protocol of continuing the  sleep period for an additional 15 minutes after sleep onset during the nap  periods was not done.  It is difficult to assess whether the patient would  have in fact  had sleep onset REM periods and therefore, the patient may need to be  referred back to the sleep lab for a repeat multiple sleep latency test  observing the appropriate protocols.      Coralyn Helling, M.D.  Diplomat, Biomedical engineer of Sleep Medicine  Electronically Signed     VS/MEDQ  D:  09/23/2005 19:18:40  T:  09/24/2005 12:52:24  Job:  [id number]

## 2010-10-27 NOTE — Assessment & Plan Note (Signed)
[facility]                               PULMONARY OFFICE NOTE   [name], [name] DESMITH                   MRN:          [id number]  DATE:02/26/2006                            DOB:          07-25-1965    I saw [name] [name] today for followup of her insomnia as well as restless leg  syndrome.  She says that she feels that the Ambien CR is not working as  effectively as it was initially.  She says she has had more difficulty with  sleep over the last 4-5 weeks.  Her current sleep pattern is that she will  take her Ambien at about 8:30 to 8:45 and then she will go to bed at about  9:00.  However, she is watching TV in bed for about 30 minutes to an hour.  Then, she says it takes her about 30 minutes to fall asleep after this,  although sometimes it can take her up until midnight before she is able to  fall asleep and this can happen 2 to 3 times a week.  She says that, once  she is asleep, she will be able to sleep until about 5:15 in the morning.  She says that, however, when she is waking up now, she still feels tired.  She will sometimes take a nap in the afternoon for about 20 minutes.  She  says that she will sometimes look at the clock at night and, often times she  will be thinking about things related to work as well as her perseverating  about difficulties with her sleep during the course of the night.  She says  that, with regards to her leg symptoms, this is improved considerably and  she is still taking her iron supplementation.  At this time, what I have  advised her to do is I have discussed with her sleep restriction as well as  stimulus control and relaxation techniques.  I have also discussed several  issues with regards to improving her sleep hygiene.  I will switch her from  Ambien CR to Ambien 10 mg nightly and then will assess her in approximately  1 month to see how she is doing with this regimen.  With regards to her  restless leg  syndrome, I will continue her on her iron supplements for now  and then at the next followup would have her undergo repeat ferritin level  to see if she needs to have continued use of iron supplementation.                                    Coralyn Helling, MD   VS/MedQ  DD:  02/26/2006  DT:  02/27/2006  Job #:  [id number]

## 2010-10-27 NOTE — Assessment & Plan Note (Signed)
[facility]                               PULMONARY OFFICE NOTE   NAME:[name] [name], [name] [name]                   MRN:          [id number]  DATE:03/27/2006                            DOB:          [birth year]    I saw [name] [name] today for followup of her sleep onset insomnia as well as  her restless leg syndrome. She says that her leg symptoms have come back  again, although she actually stopped taking her ferrous sulfate about two to  three weeks ago. She says that she has had few instances in which she has  had what sounds like confusional arousals while using her Ambien. She said  that she has gotten up in the middle of the night and made telephone calls  to people without recalling this the next day. She has also had instances in  which she has actually eaten something at night without realizing this, and  she says the only way she realizes this later on is that she will find the  wrappers around her bedroom. She says that she also gets a cramping feeling  in her legs when she tries to tense her legs. She also says once she is able  to fall asleep that she is able to remain asleep. She says her main problem  is with sleep initiation. She is not taking naps during the day anymore  however. Additionally, she was having some symptoms of sinus congestion with  a sore throat, cough and fever. She was prescribed a Z-Pak, which improved  some of her symptoms, but she is still having some sinus pressure and  headache.   CURRENT MEDICATIONS:  1. Yasmin once daily.  2. Lexapro 10 mg daily.  3. Multivitamin.  4. Rhinocort nasal spray, once daily.  5. Ambien 10 mg q.h.s.   PHYSICAL EXAMINATION:  VITAL SIGNS: Weight 104, temperature 98.4, blood  pressure 100/58, heart rate 96, oxygen saturation is 99% on room air.  HEENT: She has tenderness over maxillary and frontal sinus area. She has a  pale nasal mucosa. There is no oral lesion.   IMPRESSION:  1. Sleep  onset insomnia. I again re-emphasized the various techniques with      cognitive behavioral therapy. I will initiate her on Trazodone 50 mg      q.h.s., and continue her on her current dose of Ambien at 10 mg q.h.s.,      although the hope would be that we would be able to eventually decrease      this and possibly leave her off the Ambien. I feel that much of her      symptoms may still actually be related to having a type-A personality.      Hopefully, with the Trazodone, this might help to some degree.  2. Confusional arousals. This is likely related to Ambien, and hopefully      once we are able to taper the dose of this, this will improve.  3. Restless leg syndrome. I will defer checking her ferritin level at this      time, and I have asked  her to re-initiate the use of her ferrous      sulfate, and vitamin C, and then her monitor her symptom status. If she      is still symptomatic after this, then consideration could be given to      having her start on Mirapex. Again, she did have intolerance to Requip      previously.  4. Acute sinusitis. I have instructed her on the use of nasal irrigation.      I would advise her to continue using her nasal saline sprays and      continue to use her Rhinocort, and I have given a sample of this to      her. I will initiate her on Astelin 2 sprays in each nostril b.i.d. as      needed and I have given her a sample of this also. I would plan on      following up with her in the office in about 2 months.       Coralyn Helling, MD      VS/MedQ  DD:  03/27/2006  DT:  03/29/2006  Job #:  [id number]

## 2011-02-06 ENCOUNTER — Ambulatory Visit (INDEPENDENT_AMBULATORY_CARE_PROVIDER_SITE_OTHER): Payer: Commercial Managed Care - PPO | Admitting: Nurse Practitioner

## 2011-02-06 ENCOUNTER — Encounter: Payer: Self-pay | Admitting: Nurse Practitioner

## 2011-02-06 DIAGNOSIS — M531 Cervicobrachial syndrome: Secondary | ICD-10-CM

## 2011-02-06 DIAGNOSIS — M5481 Occipital neuralgia: Secondary | ICD-10-CM

## 2011-02-06 DIAGNOSIS — G43109 Migraine with aura, not intractable, without status migrainosus: Secondary | ICD-10-CM | POA: Insufficient documentation

## 2011-02-06 MED ORDER — TOPIRAMATE 25 MG PO TABS: ORAL_TABLET | ORAL | Status: DC

## 2011-02-06 MED ORDER — RIZATRIPTAN BENZOATE 10 MG PO TABS: 10.0000 mg | ORAL_TABLET | Freq: Once | ORAL | Status: DC | PRN

## 2011-02-06 NOTE — Progress Notes (Signed)
Diagnosis: Hx migraine with aura  Location:[place]  Number of Headache days/month: 30 severe. Most days goes home after work and ices her head and lies down Severe:30 Moderate:0 Mild:0  Medications: Current trazadone for sleep, clonazepam and zoloft for anxiety and sleep, BCPs  Acute prevention: None   She  has a past medical history of Anxiety and Migraine headache., She  has past surgical history that includes Tonsillectomy and adenoidectomy; Cholecystectomy; Thoracoscopy; Dilation and curettage of uterus; and Wisdom tooth extraction., Her family history includes Cancer in her mother; Diabetes in her mother; Heart disease in her father; and Hypertension in her mother., She She has a current medication list which includes the following prescription(s): clonazepam, drospirenone-ethinyl estradiol, l-methylfolate-b6-b12, methylphenidate, sertraline, sertraline, trazodone, rizatriptan, and topiramate. and She is allergic to metoclopramide hcl and penicillins.   reports that she has never smoked. She has never used smokeless tobacco. She reports that she drinks alcohol. She reports that she does not use illicit drugs.  Triggers:Stress,   Birth control: birth control pills  ROS: positive for numbness in right arm, night sweats, weight loss, frequent headaches, problems with vision, nausea  Procedure : Trigger point injections in right temple, occipital nerve and right lateral spinal muscle. Total 5cc each site approx 1 cc. Pt had good effect and felt better after injections  Exam: HEENT neg Trigger points were tender in above sites Cardiac RRR Lungs clear Skin warm/ dry Neuro: Negative  Impression: Migraine with aura/ Right occipital neuralgia  Plan: Pt is asked to stop BCPs immediately. Her husband plans vasectomy. She will keep menses diary, if menses becomes a problem she will consider Cleta Alberts IUD.  Trigger point injections today and if successful will consider repeat to settle  down nerves Start topamax 25mg  one for one week, two for one week then up to 3 tabs qhs till seen. She is asked to go slower with taper if needed as she has sensitivity to meds Maxalt to trial prn. Will not be very helpful until headaches are in better control RTC 2-4 weeks  Time Spent: 60 min

## 2011-02-23 ENCOUNTER — Telehealth: Payer: Self-pay | Admitting: *Deleted

## 2011-02-23 NOTE — Telephone Encounter (Signed)
Taking 12.5 mg, and increased it to 25mg  after one week.  It has caused a terrible personality change that she can not stand.  She would like for you to call her.  She also had a terrible period accompanied by a bad migraine that lasted all weekend.  She has went back down to the 12.5 mg but would like your advice on what to do.

## 2011-03-06 ENCOUNTER — Encounter: Payer: Commercial Managed Care - PPO | Admitting: Nurse Practitioner

## 2011-03-06 DIAGNOSIS — G43109 Migraine with aura, not intractable, without status migrainosus: Secondary | ICD-10-CM

## 2011-03-12 LAB — COMPREHENSIVE METABOLIC PANEL
Albumin: 3.8
BUN: 9
Creatinine, Ser: 0.83
Potassium: 3.9
Total Protein: 6.9

## 2011-03-12 LAB — PROTIME-INR: INR: 1

## 2011-03-12 LAB — CBC
HCT: 41.5
MCV: 87.7
Platelets: 240
RDW: 12.3

## 2011-03-12 LAB — URINALYSIS, ROUTINE W REFLEX MICROSCOPIC
Nitrite: NEGATIVE
Specific Gravity, Urine: 1.024
Urobilinogen, UA: 0.2

## 2011-03-12 LAB — APTT: aPTT: 25

## 2011-03-21 LAB — APTT: aPTT: 26

## 2011-03-21 LAB — DIFFERENTIAL
Basophils Absolute: 0
Lymphocytes Relative: 23
Monocytes Absolute: 0.5
Monocytes Relative: 7
Neutro Abs: 4.3

## 2011-03-21 LAB — BASIC METABOLIC PANEL
Calcium: 9.8
GFR calc Af Amer: >60
GFR calc non Af Amer: >60
Sodium: 138

## 2011-03-21 LAB — CBC
Hemoglobin: 13.9
RBC: 4.69
RDW: 12.3

## 2011-03-21 LAB — PROTIME-INR: INR: 1

## 2011-11-26 ENCOUNTER — Other Ambulatory Visit: Payer: Self-pay | Admitting: Obstetrics and Gynecology

## 2011-11-26 DIAGNOSIS — Z1231 Encounter for screening mammogram for malignant neoplasm of breast: Secondary | ICD-10-CM

## 2011-11-28 ENCOUNTER — Ambulatory Visit: Payer: Commercial Managed Care - PPO

## 2011-12-06 ENCOUNTER — Ambulatory Visit
Admission: RE | Admit: 2011-12-06 | Discharge: 2011-12-06 | Disposition: A | Discharge status: Home / Self Care | Payer: Commercial Managed Care - PPO | Source: Ambulatory Visit | Attending: Obstetrics and Gynecology | Admitting: Obstetrics and Gynecology

## 2011-12-06 DIAGNOSIS — Z1231 Encounter for screening mammogram for malignant neoplasm of breast: Secondary | ICD-10-CM

## 2012-06-19 ENCOUNTER — Other Ambulatory Visit (HOSPITAL_COMMUNITY): Payer: Self-pay | Admitting: Orthopedic Surgery

## 2012-06-19 DIAGNOSIS — M25571 Pain in right ankle and joints of right foot: Secondary | ICD-10-CM

## 2012-06-21 ENCOUNTER — Ambulatory Visit (HOSPITAL_COMMUNITY): Payer: Commercial Managed Care - PPO

## 2012-06-23 ENCOUNTER — Ambulatory Visit (HOSPITAL_COMMUNITY): Payer: 59

## 2012-06-23 ENCOUNTER — Ambulatory Visit (HOSPITAL_COMMUNITY)
Admission: RE | Admit: 2012-06-23 | Discharge: 2012-06-23 | Disposition: A | Discharge status: Home / Self Care | Payer: 59 | Source: Ambulatory Visit | Attending: Orthopedic Surgery | Admitting: Orthopedic Surgery

## 2012-06-23 DIAGNOSIS — M25579 Pain in unspecified ankle and joints of unspecified foot: Secondary | ICD-10-CM | POA: Insufficient documentation

## 2012-06-23 DIAGNOSIS — M216X9 Other acquired deformities of unspecified foot: Secondary | ICD-10-CM | POA: Insufficient documentation

## 2012-06-23 DIAGNOSIS — M25476 Effusion, unspecified foot: Secondary | ICD-10-CM | POA: Insufficient documentation

## 2012-06-23 DIAGNOSIS — M25571 Pain in right ankle and joints of right foot: Secondary | ICD-10-CM

## 2012-06-23 DIAGNOSIS — M79609 Pain in unspecified limb: Secondary | ICD-10-CM | POA: Insufficient documentation

## 2012-06-23 DIAGNOSIS — M25473 Effusion, unspecified ankle: Secondary | ICD-10-CM | POA: Insufficient documentation

## 2012-06-28 ENCOUNTER — Ambulatory Visit (HOSPITAL_COMMUNITY): Payer: 59

## 2012-06-28 ENCOUNTER — Ambulatory Visit (HOSPITAL_COMMUNITY): Admission: RE | Admit: 2012-06-28 | Payer: 59 | Source: Ambulatory Visit

## 2012-09-09 ENCOUNTER — Encounter (HOSPITAL_COMMUNITY): Payer: Self-pay | Admitting: Anesthesiology

## 2012-10-01 ENCOUNTER — Other Ambulatory Visit (HOSPITAL_COMMUNITY): Payer: Self-pay | Admitting: Anesthesiology

## 2012-10-01 DIAGNOSIS — M79671 Pain in right foot: Secondary | ICD-10-CM

## 2012-10-09 ENCOUNTER — Encounter (HOSPITAL_COMMUNITY): Payer: 59

## 2012-10-20 ENCOUNTER — Encounter (HOSPITAL_COMMUNITY)
Admission: RE | Admit: 2012-10-20 | Discharge: 2012-10-20 | Disposition: A | Discharge status: Home / Self Care | Payer: 59 | Source: Ambulatory Visit | Attending: Anesthesiology | Admitting: Anesthesiology

## 2012-10-20 ENCOUNTER — Encounter (HOSPITAL_COMMUNITY): Payer: Self-pay

## 2012-10-20 ENCOUNTER — Ambulatory Visit (HOSPITAL_COMMUNITY)
Admission: RE | Admit: 2012-10-20 | Discharge: 2012-10-20 | Disposition: A | Discharge status: Home / Self Care | Payer: 59 | Source: Ambulatory Visit | Attending: Anesthesiology | Admitting: Anesthesiology

## 2012-10-20 DIAGNOSIS — M79671 Pain in right foot: Secondary | ICD-10-CM

## 2012-10-20 DIAGNOSIS — M79609 Pain in unspecified limb: Secondary | ICD-10-CM | POA: Insufficient documentation

## 2012-10-20 MED ORDER — TECHNETIUM TC 99M MEDRONATE IV KIT
25.5000 | PACK | Freq: Once | INTRAVENOUS | Status: AC | PRN
  Administered 2012-10-20: 25.5 via INTRAVENOUS

## 2012-11-04 ENCOUNTER — Ambulatory Visit (INDEPENDENT_AMBULATORY_CARE_PROVIDER_SITE_OTHER): Payer: 59 | Admitting: Sports Medicine

## 2012-11-04 VITALS — BP 100/58 | Ht 61.0 in | Wt 93.0 lb

## 2012-11-04 DIAGNOSIS — M79671 Pain in right foot: Secondary | ICD-10-CM | POA: Insufficient documentation

## 2012-11-04 DIAGNOSIS — M79609 Pain in unspecified limb: Secondary | ICD-10-CM

## 2012-11-04 MED ORDER — NORTRIPTYLINE HCL 10 MG PO CAPS: 10.0000 mg | ORAL_CAPSULE | Freq: Every day | ORAL | Status: DC

## 2012-11-04 MED ORDER — CAPSAICIN 0.075 % EX CREA: TOPICAL_CREAM | Freq: Three times a day (TID) | CUTANEOUS | Status: DC

## 2012-11-04 NOTE — Assessment & Plan Note (Addendum)
Persistent pain, hyperesthesia and autonomic changes c/w complex regional pain syndrome. Mild improvement with amitryptilene, but cannot tolerate therapeutic dose due to sedation. Will change to low dose nortriptylene. Recommend topical capsaicin trial of 2 weeks. Can continue trying padded boot/shoe to avoid irritation. F/u in 4 weeks for reassessment.   If necessary she may have to take them off and see if she can push the dose of amitriptyline to get adequate pain relief and gradually restart her activities

## 2012-11-04 NOTE — Patient Instructions (Addendum)
Try capsaicin (capsicum) cream four times daily. Give this at least 2 weeks to see if it will help. Can try different tricyclic at night instead of amitriptylene.  May start nortriptilene at 5-10 mg at night.  Notify Dr. Darrick Penna of progress in 3-4 weeks.

## 2012-11-04 NOTE — Progress Notes (Signed)
  Subjective:    Patient ID: [name] [name], female    DOB: 31-Dec-1965, 47 y.o.   MRN: [id number]  HPI  1. Right foot pain/hypersensitivity. 47 yo female with right lateral foot pain, burning and coldness since January. 4 months ago this became bothersome enough to be evaluated. Prior to this in June 2013 she had a right 4th digit fracture that was treated conservatively with buddy taping. Her symptoms started with sensation of lateral foot rubbing/irritation on the shoe and progressed. The foot gets cold and turns blue sometimes. Feels burning on 5th digit and dorsum of distal foot. Worsened with wearing shoes, blanket brushes the foot, or she feels a seam of sock. Has tried wearing boot immobilizer, custom orthotics, and has undergone MRI, bone scan, xrays by both her orthopedist Dr. Fonnie Jarvis and a podiatrist. She did start very low dose amitriptilene at 2.5 mg which helps, she cannot take higher doses due to sedation.   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  Review of Systems No swelling, new injury, bleeding, rash, back injuries, weight loss.    Objective:   Physical Exam  Neurological:  Right foot has lateral hyperesthesia in peroneal nerve distribution. Distal right foot exhibits some cyanosis and feels colder than left, though is present on both sides with diminished cap refill. 1+ Bilateral DP pulses.  She does not participate with with ankle strength testing due to pain. Dorsiflexion and plantar flexion is 5/5. 2+ bilateral and brisk plantar and patellar reflexes. No joint effusion, rash.           Assessment & Plan:

## 2012-11-27 ENCOUNTER — Ambulatory Visit: Payer: Self-pay

## 2012-11-27 ENCOUNTER — Other Ambulatory Visit: Payer: Self-pay | Admitting: Occupational Medicine

## 2012-11-27 DIAGNOSIS — M549 Dorsalgia, unspecified: Secondary | ICD-10-CM

## 2012-12-15 ENCOUNTER — Other Ambulatory Visit: Payer: Self-pay

## 2012-12-15 DIAGNOSIS — Z1231 Encounter for screening mammogram for malignant neoplasm of breast: Secondary | ICD-10-CM

## 2012-12-31 ENCOUNTER — Ambulatory Visit: Payer: Self-pay

## 2013-01-08 ENCOUNTER — Ambulatory Visit
Admission: RE | Admit: 2013-01-08 | Discharge: 2013-01-08 | Disposition: A | Discharge status: Home / Self Care | Payer: 59 | Source: Ambulatory Visit

## 2013-01-08 DIAGNOSIS — Z1231 Encounter for screening mammogram for malignant neoplasm of breast: Secondary | ICD-10-CM

## 2013-02-11 ENCOUNTER — Ambulatory Visit: Payer: PRIVATE HEALTH INSURANCE | Attending: Physician Assistant

## 2013-02-11 ENCOUNTER — Ambulatory Visit: Payer: 59 | Attending: Orthopedic Surgery

## 2013-02-11 DIAGNOSIS — R5381 Other malaise: Secondary | ICD-10-CM | POA: Insufficient documentation

## 2013-02-11 DIAGNOSIS — M2560 Stiffness of unspecified joint, not elsewhere classified: Secondary | ICD-10-CM | POA: Insufficient documentation

## 2013-02-11 DIAGNOSIS — IMO0001 Reserved for inherently not codable concepts without codable children: Secondary | ICD-10-CM | POA: Insufficient documentation

## 2013-02-11 DIAGNOSIS — M25529 Pain in unspecified elbow: Secondary | ICD-10-CM | POA: Insufficient documentation

## 2013-02-17 ENCOUNTER — Ambulatory Visit: Payer: PRIVATE HEALTH INSURANCE

## 2013-02-17 ENCOUNTER — Ambulatory Visit: Payer: 59

## 2013-02-20 ENCOUNTER — Ambulatory Visit: Payer: 59 | Admitting: Physical Therapy

## 2013-02-20 ENCOUNTER — Ambulatory Visit: Payer: PRIVATE HEALTH INSURANCE | Admitting: Physical Therapy

## 2013-02-23 ENCOUNTER — Ambulatory Visit: Payer: 59 | Admitting: Physical Therapy

## 2013-02-23 ENCOUNTER — Ambulatory Visit: Payer: PRIVATE HEALTH INSURANCE | Admitting: Physical Therapy

## 2013-02-25 ENCOUNTER — Ambulatory Visit: Payer: PRIVATE HEALTH INSURANCE | Admitting: Physical Therapy

## 2013-02-25 ENCOUNTER — Ambulatory Visit: Payer: 59 | Admitting: Physical Therapy

## 2013-03-02 ENCOUNTER — Ambulatory Visit: Payer: PRIVATE HEALTH INSURANCE

## 2013-03-02 ENCOUNTER — Ambulatory Visit: Payer: 59

## 2013-03-04 ENCOUNTER — Ambulatory Visit: Payer: PRIVATE HEALTH INSURANCE

## 2013-03-04 ENCOUNTER — Ambulatory Visit: Payer: PRIVATE HEALTH INSURANCE | Admitting: Physical Therapy

## 2013-03-04 ENCOUNTER — Ambulatory Visit: Payer: 59

## 2013-03-09 ENCOUNTER — Ambulatory Visit: Payer: PRIVATE HEALTH INSURANCE | Admitting: Physical Therapy

## 2013-03-09 ENCOUNTER — Ambulatory Visit: Payer: 59 | Admitting: Physical Therapy

## 2013-03-10 ENCOUNTER — Other Ambulatory Visit (HOSPITAL_COMMUNITY): Payer: Self-pay | Admitting: Orthopedic Surgery

## 2013-03-10 DIAGNOSIS — M25521 Pain in right elbow: Secondary | ICD-10-CM

## 2013-03-11 ENCOUNTER — Ambulatory Visit: Payer: PRIVATE HEALTH INSURANCE | Attending: Physician Assistant

## 2013-03-11 ENCOUNTER — Ambulatory Visit: Payer: 59

## 2013-03-11 DIAGNOSIS — M25529 Pain in unspecified elbow: Secondary | ICD-10-CM | POA: Insufficient documentation

## 2013-03-11 DIAGNOSIS — M2560 Stiffness of unspecified joint, not elsewhere classified: Secondary | ICD-10-CM | POA: Insufficient documentation

## 2013-03-11 DIAGNOSIS — R5381 Other malaise: Secondary | ICD-10-CM | POA: Insufficient documentation

## 2013-03-11 DIAGNOSIS — IMO0001 Reserved for inherently not codable concepts without codable children: Secondary | ICD-10-CM | POA: Insufficient documentation

## 2013-03-16 ENCOUNTER — Ambulatory Visit: Payer: PRIVATE HEALTH INSURANCE

## 2013-03-16 ENCOUNTER — Ambulatory Visit (HOSPITAL_COMMUNITY)
Admission: RE | Admit: 2013-03-16 | Discharge: 2013-03-16 | Disposition: A | Discharge status: Home / Self Care | Payer: 59 | Source: Ambulatory Visit | Attending: Orthopedic Surgery | Admitting: Orthopedic Surgery

## 2013-03-16 ENCOUNTER — Ambulatory Visit: Payer: 59

## 2013-03-16 DIAGNOSIS — R209 Unspecified disturbances of skin sensation: Secondary | ICD-10-CM | POA: Insufficient documentation

## 2013-03-16 DIAGNOSIS — M899 Disorder of bone, unspecified: Secondary | ICD-10-CM | POA: Insufficient documentation

## 2013-03-16 DIAGNOSIS — M25521 Pain in right elbow: Secondary | ICD-10-CM

## 2013-03-16 DIAGNOSIS — M25529 Pain in unspecified elbow: Secondary | ICD-10-CM | POA: Insufficient documentation

## 2013-03-16 DIAGNOSIS — X58XXXA Exposure to other specified factors, initial encounter: Secondary | ICD-10-CM | POA: Insufficient documentation

## 2013-03-16 DIAGNOSIS — R609 Edema, unspecified: Secondary | ICD-10-CM | POA: Insufficient documentation

## 2013-03-16 DIAGNOSIS — S53499A Other sprain of unspecified elbow, initial encounter: Secondary | ICD-10-CM | POA: Insufficient documentation

## 2013-03-17 ENCOUNTER — Other Ambulatory Visit (HOSPITAL_COMMUNITY): Payer: Self-pay

## 2013-03-18 ENCOUNTER — Ambulatory Visit: Payer: 59 | Admitting: Physical Therapy

## 2013-03-18 ENCOUNTER — Ambulatory Visit: Payer: PRIVATE HEALTH INSURANCE

## 2013-03-23 ENCOUNTER — Ambulatory Visit: Payer: 59 | Admitting: Physical Therapy

## 2013-03-23 ENCOUNTER — Ambulatory Visit: Payer: PRIVATE HEALTH INSURANCE | Admitting: Physical Therapy

## 2013-03-25 ENCOUNTER — Ambulatory Visit: Payer: PRIVATE HEALTH INSURANCE

## 2013-03-25 ENCOUNTER — Ambulatory Visit: Payer: 59 | Admitting: Physical Therapy

## 2013-03-30 ENCOUNTER — Ambulatory Visit: Payer: 59 | Admitting: Physical Therapy

## 2013-03-30 ENCOUNTER — Ambulatory Visit: Payer: PRIVATE HEALTH INSURANCE

## 2013-04-01 ENCOUNTER — Ambulatory Visit: Payer: PRIVATE HEALTH INSURANCE | Admitting: Physical Therapy

## 2013-04-01 ENCOUNTER — Ambulatory Visit: Payer: 59 | Admitting: Physical Therapy

## 2013-04-02 ENCOUNTER — Other Ambulatory Visit (HOSPITAL_COMMUNITY): Payer: Self-pay | Admitting: Orthopedic Surgery

## 2013-04-02 DIAGNOSIS — M25512 Pain in left shoulder: Secondary | ICD-10-CM

## 2013-04-06 ENCOUNTER — Ambulatory Visit: Payer: PRIVATE HEALTH INSURANCE

## 2013-04-06 ENCOUNTER — Ambulatory Visit: Payer: 59

## 2013-04-07 ENCOUNTER — Ambulatory Visit (HOSPITAL_COMMUNITY): Payer: 59

## 2013-04-08 ENCOUNTER — Ambulatory Visit: Payer: PRIVATE HEALTH INSURANCE

## 2013-04-08 ENCOUNTER — Ambulatory Visit: Payer: 59

## 2013-04-13 ENCOUNTER — Ambulatory Visit (HOSPITAL_COMMUNITY)
Admission: RE | Admit: 2013-04-13 | Discharge: 2013-04-13 | Disposition: A | Discharge status: Home / Self Care | Payer: 59 | Source: Ambulatory Visit | Attending: Orthopedic Surgery | Admitting: Orthopedic Surgery

## 2013-04-13 DIAGNOSIS — M719 Bursopathy, unspecified: Secondary | ICD-10-CM | POA: Insufficient documentation

## 2013-04-13 DIAGNOSIS — M25512 Pain in left shoulder: Secondary | ICD-10-CM

## 2013-04-13 DIAGNOSIS — M67919 Unspecified disorder of synovium and tendon, unspecified shoulder: Secondary | ICD-10-CM | POA: Insufficient documentation

## 2014-02-10 ENCOUNTER — Other Ambulatory Visit: Payer: Self-pay

## 2014-02-10 DIAGNOSIS — Z1231 Encounter for screening mammogram for malignant neoplasm of breast: Secondary | ICD-10-CM

## 2014-02-23 ENCOUNTER — Ambulatory Visit
Admission: RE | Admit: 2014-02-23 | Discharge: 2014-02-23 | Disposition: A | Discharge status: Home / Self Care | Payer: 59 | Source: Ambulatory Visit

## 2014-02-23 DIAGNOSIS — Z1231 Encounter for screening mammogram for malignant neoplasm of breast: Secondary | ICD-10-CM

## 2014-03-08 ENCOUNTER — Other Ambulatory Visit: Payer: Self-pay | Admitting: Gastroenterology

## 2014-03-08 DIAGNOSIS — R1084 Generalized abdominal pain: Secondary | ICD-10-CM

## 2014-03-15 ENCOUNTER — Other Ambulatory Visit: Payer: Self-pay

## 2014-03-22 DIAGNOSIS — R231 Pallor: Secondary | ICD-10-CM | POA: Insufficient documentation

## 2014-03-22 DIAGNOSIS — R5383 Other fatigue: Secondary | ICD-10-CM | POA: Insufficient documentation

## 2014-03-22 DIAGNOSIS — M2550 Pain in unspecified joint: Secondary | ICD-10-CM | POA: Insufficient documentation

## 2014-03-22 DIAGNOSIS — G5630 Lesion of radial nerve, unspecified upper limb: Secondary | ICD-10-CM | POA: Insufficient documentation

## 2014-06-21 DIAGNOSIS — R768 Other specified abnormal immunological findings in serum: Secondary | ICD-10-CM | POA: Insufficient documentation

## 2014-08-12 ENCOUNTER — Other Ambulatory Visit: Payer: Self-pay

## 2014-08-19 ENCOUNTER — Other Ambulatory Visit: Payer: Self-pay

## 2014-08-24 ENCOUNTER — Other Ambulatory Visit: Payer: Self-pay | Admitting: Otolaryngology

## 2014-08-24 ENCOUNTER — Ambulatory Visit
Admission: RE | Admit: 2014-08-24 | Discharge: 2014-08-24 | Disposition: A | Discharge status: Home / Self Care | Payer: 59 | Source: Ambulatory Visit | Attending: Otolaryngology | Admitting: Otolaryngology

## 2014-08-24 DIAGNOSIS — R1313 Dysphagia, pharyngeal phase: Secondary | ICD-10-CM

## 2014-08-24 DIAGNOSIS — R059 Cough, unspecified: Secondary | ICD-10-CM

## 2014-08-24 DIAGNOSIS — R1310 Dysphagia, unspecified: Secondary | ICD-10-CM

## 2014-08-24 DIAGNOSIS — R05 Cough: Secondary | ICD-10-CM

## 2014-09-08 ENCOUNTER — Other Ambulatory Visit: Payer: Self-pay | Admitting: Otolaryngology

## 2014-09-08 ENCOUNTER — Ambulatory Visit
Admission: RE | Admit: 2014-09-08 | Discharge: 2014-09-08 | Disposition: A | Discharge status: Home / Self Care | Payer: 59 | Source: Ambulatory Visit | Attending: Otolaryngology | Admitting: Otolaryngology

## 2014-09-08 DIAGNOSIS — R1313 Dysphagia, pharyngeal phase: Secondary | ICD-10-CM

## 2014-09-08 DIAGNOSIS — J690 Pneumonitis due to inhalation of food and vomit: Secondary | ICD-10-CM

## 2014-09-20 ENCOUNTER — Other Ambulatory Visit (HOSPITAL_COMMUNITY): Payer: Self-pay | Admitting: Otolaryngology

## 2014-09-20 DIAGNOSIS — R1310 Dysphagia, unspecified: Secondary | ICD-10-CM

## 2014-09-22 ENCOUNTER — Ambulatory Visit (HOSPITAL_COMMUNITY)
Admission: RE | Admit: 2014-09-22 | Discharge: 2014-09-22 | Disposition: A | Discharge status: Home / Self Care | Payer: 59 | Source: Ambulatory Visit | Attending: Otolaryngology | Admitting: Otolaryngology

## 2014-09-22 DIAGNOSIS — R1310 Dysphagia, unspecified: Secondary | ICD-10-CM | POA: Insufficient documentation

## 2014-09-22 NOTE — Progress Notes (Signed)
Speech Pathology     MBS completed. Please go to chart review, then imaging and click on swallow function for results and recommendations.      Breck CoonsLisa Willis La AlianzaLitaker M.Ed [employer] Pager [phone]

## 2015-03-23 ENCOUNTER — Other Ambulatory Visit: Payer: Self-pay

## 2015-03-23 DIAGNOSIS — Z1231 Encounter for screening mammogram for malignant neoplasm of breast: Secondary | ICD-10-CM

## 2015-04-27 ENCOUNTER — Ambulatory Visit
Admission: RE | Admit: 2015-04-27 | Discharge: 2015-04-27 | Disposition: A | Discharge status: Home / Self Care | Payer: 59 | Source: Ambulatory Visit

## 2015-04-27 DIAGNOSIS — Z1231 Encounter for screening mammogram for malignant neoplasm of breast: Secondary | ICD-10-CM

## 2015-08-24 ENCOUNTER — Encounter: Payer: Self-pay | Admitting: Vascular Surgery

## 2015-08-30 ENCOUNTER — Encounter: Payer: Self-pay | Admitting: Vascular Surgery

## 2015-08-30 ENCOUNTER — Ambulatory Visit (INDEPENDENT_AMBULATORY_CARE_PROVIDER_SITE_OTHER): Payer: 59 | Admitting: Vascular Surgery

## 2015-08-30 VITALS — BP 111/79 | HR 101 | Temp 98.5°F | Resp 18 | Ht 60.25 in | Wt 99.1 lb

## 2015-08-30 DIAGNOSIS — I808 Phlebitis and thrombophlebitis of other sites: Secondary | ICD-10-CM

## 2015-08-30 NOTE — Progress Notes (Signed)
[facility] [place]  Patient name: [name] [name] MRN: [id number] DOB: 28-Aug-1965 Sex: female  REASON FOR CONSULT: Left arm pain  HPI: [name] [name] is a 49 y.o. female, who presents for evaluation of a thrombosed left upper extremity vein. She was referred here by Dr. Gilman Schmidt. The patient reports a sensation of "ice" running through her left arm vein. This occurred around the time she received a ketamine infusion in her left wrist that initially infiltrated the vein back in August 2016. She is also complaining of other issues in her other extremities. The patient is a former Garment/textile technologist. She broke her right fourth toe a couple years ago and continued to have significant pain afterwards. This was thought to be complex regional pain syndrome. She has also complained of pain in her right elbow after she bumped it.  The patient reports having to alter her daily activities due to the various pain in her extremities. She has been seen by orthopedics and rheumatology. She has been placed on Lyrica in the past. This has not helped.  She has a past medical history of sarcoidosis and anxiety.  Past Medical History  Diagnosis Date  . Anxiety   . Migraine headache     Family History  Problem Relation Age of Onset  . Diabetes Mother     TYPE 2  . Hypertension Mother   . Cancer Mother     UTERINE CANCER  . Heart disease Father     HEART ATTACK    SOCIAL HISTORY: Social History   Social History  . Marital Status: Married    Spouse Name: N/A  . Number of Children: N/A  . Years of Education: N/A   Occupational History  . Not on file.   Social History Main Topics  . Smoking status: Never Smoker   . Smokeless tobacco: Never Used  . Alcohol Use: Yes     Comment: HALF GLASS WINE PER DAY.  . Drug Use: No  . Sexual Activity:    Partners: Male    Birth Control/ Protection: Pill   Other Topics Concern  . Not on file   Social History Narrative      Allergies  Allergen Reactions  . Levaquin [Levofloxacin In D5w]     Severe rash  . Metoclopramide Hcl   . Penicillins   . Reglan [Metoclopramide] Anxiety    Current Outpatient Prescriptions  Medication Sig Dispense Refill  . ALPRAZolam (XANAX) 0.5 MG tablet Take 0.5 mg by mouth at bedtime as needed for anxiety.    . AMPHETAMINE-DEXTROAMPHETAMINE PO Take 15 mg by mouth. Takes 30 mg in AM and 15 mg in afternoon    . Drospirenone-Ethinyl Estradiol (YAZ PO) Take by mouth.      . sertraline (ZOLOFT) 100 MG tablet Take 100 mg by mouth daily.      . traZODone (DESYREL) 50 MG tablet Take 50 mg by mouth at bedtime. Takes 50-100 mg every night at bedtime.    . capsicum (ZOSTRIX) 0.075 % topical cream Apply topically 3 (three) times daily. (Patient not taking: Reported on 08/30/2015) 28.3 g 1  . clonazePAM (KLONOPIN) 0.25 MG disintegrating tablet Take 0.25 mg by mouth 3 (three) times daily as needed. Reported on 08/30/2015    . l-methylfolate-B6-B12 (METANX) 3-35-2 MG TABS Take 1 tablet by mouth daily. Reported on 08/30/2015    . methylphenidate (RITALIN) 20 MG tablet Take 20 mg by mouth 2 (two) times daily. Reported on 08/30/2015    .  nortriptyline (PAMELOR) 10 MG capsule Take 1 capsule (10 mg total) by mouth at bedtime. (Patient not taking: Reported on 08/30/2015) 30 capsule 1  . rizatriptan (MAXALT) 10 MG tablet Take 1 tablet (10 mg total) by mouth once as needed for migraine. May repeat in 2 hours if needed 12 tablet 2  . sertraline (ZOLOFT) 50 MG tablet Take 50 mg by mouth daily. Reported on 08/30/2015    . topiramate (TOPAMAX) 25 MG tablet Take 3 tabs qhs (Patient not taking: Reported on 08/30/2015) 90 tablet 1   No current facility-administered medications for this visit.    REVIEW OF SYSTEMS:   denotes positive finding,  denotes negative finding Cardiac  Comments:  Chest pain or chest pressure:    Shortness of breath upon exertion:    Short of breath when lying flat:    Irregular  heart rhythm:        Vascular    Pain in calf, thigh, or hip brought on by ambulation:    Pain in feet at night that wakes you up from your sleep:  x   Blood clot in your veins: x   Leg swelling:         Pulmonary    Oxygen at home:    Productive cough:     Wheezing:         Neurologic    Sudden weakness in arms or legs:     Sudden numbness in arms or legs:     Sudden onset of difficulty speaking or slurred speech:    Temporary loss of vision in one eye:     Problems with dizziness:         Gastrointestinal    Blood in stool:     Vomited blood:         Genitourinary    Burning when urinating:     Blood in urine:        Psychiatric    Major depression:         Hematologic    Bleeding problems:    Problems with blood clotting too easily:        Skin    Rashes or ulcers:        Constitutional    Fever or chills:      PHYSICAL EXAM: Filed Vitals:   08/30/15 1326  BP: 111/79  Pulse: 101  Temp: 98.5 F (36.9 C)  TempSrc: Oral  Resp: 18  Height: 5' 0.25" (1.53 m)  Weight: 99 lb 1.6 oz (44.951 kg)  SpO2: 97%    GENERAL: The patient is a well-nourished female, in no acute distress. The vital signs are documented above. VASCULAR: Palpable radial pulses, right dorsalis pedis and posterior tibial pulses. Bilateral hands and right foot are erythematous. No signs of infection. Palpable cord left upper arm PULMONARY: Nonlabored respiratory effort MUSCULOSKELETAL: There are no major deformities or cyanosis. NEUROLOGIC: No focal weakness or paresthesias are detected. SKIN: There are no ulcers or rashes noted. PSYCHIATRIC: The patient has a normal affect.  MEDICAL ISSUES: Superficial thrombophlebitis left basilic vein Left arm pain  On SonoSite exam today, the patient has evidence of superficial thrombophlebitis. This does not explain her symptoms. The patient has a normal pulse exam. Explained that superficial thrombophlebitis is usually self-limiting. She has had  these symptoms since August 2016. Given other pain symptoms and her other extremities, recommend continued workup with rheumatology.    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 Very unusual symptom complex with initially having persistent severe pain in her right leg related to right fifth toe fracture. Then had pain syndrome in her right arm related to trivial trauma to her elbow. Now is having persistent pain in her left arm following IV ketamine dosing in her left wrist. By physical exam she does have palpable thrombosed and sclerotic basilic vein on the left. She has a large patent basilic vein in her right arm. I did image this area with SonoSite ultrasound dystocia thrombosis.  I spoke to her extensively about this. Spine there would certainly be no treatment for the superficial thrombophlebitis. Explained that this would be highly unusual to cause persistent pain since this is common and typically runs its course in the 1-2 week time. She does have the reverse changes in both hands the toes of her right foot. Suspect that this isn't somehow a rheumatologic issue. She was restricted reassured with this discussion. No evidence of arterial or venous significant pathology. She will see us again on an as-needed basis  Gretta BeganEarly, Avyan Livesay, MD 08/30/2015 2:09 PM

## 2015-12-01 ENCOUNTER — Other Ambulatory Visit: Payer: Self-pay | Admitting: Gastroenterology

## 2016-04-05 ENCOUNTER — Other Ambulatory Visit: Payer: Self-pay | Admitting: Obstetrics and Gynecology

## 2016-04-05 DIAGNOSIS — Z1231 Encounter for screening mammogram for malignant neoplasm of breast: Secondary | ICD-10-CM

## 2016-04-30 ENCOUNTER — Ambulatory Visit
Admission: RE | Admit: 2016-04-30 | Discharge: 2016-04-30 | Disposition: A | Discharge status: Home / Self Care | Payer: 59 | Source: Ambulatory Visit | Attending: Obstetrics and Gynecology | Admitting: Obstetrics and Gynecology

## 2016-04-30 DIAGNOSIS — Z1231 Encounter for screening mammogram for malignant neoplasm of breast: Secondary | ICD-10-CM

## 2016-05-07 ENCOUNTER — Other Ambulatory Visit: Payer: Self-pay | Admitting: Obstetrics and Gynecology

## 2016-05-07 DIAGNOSIS — R928 Other abnormal and inconclusive findings on diagnostic imaging of breast: Secondary | ICD-10-CM

## 2016-05-11 ENCOUNTER — Ambulatory Visit
Admission: RE | Admit: 2016-05-11 | Discharge: 2016-05-11 | Disposition: A | Discharge status: Home / Self Care | Payer: 59 | Source: Ambulatory Visit | Attending: Obstetrics and Gynecology | Admitting: Obstetrics and Gynecology

## 2016-05-11 DIAGNOSIS — R928 Other abnormal and inconclusive findings on diagnostic imaging of breast: Secondary | ICD-10-CM

## 2017-02-19 DIAGNOSIS — G8929 Other chronic pain: Secondary | ICD-10-CM | POA: Diagnosis not present

## 2017-02-19 DIAGNOSIS — M25562 Pain in left knee: Secondary | ICD-10-CM | POA: Diagnosis not present

## 2017-02-27 DIAGNOSIS — M25562 Pain in left knee: Secondary | ICD-10-CM | POA: Diagnosis not present

## 2017-02-27 DIAGNOSIS — G8929 Other chronic pain: Secondary | ICD-10-CM | POA: Diagnosis not present

## 2017-03-05 DIAGNOSIS — G8929 Other chronic pain: Secondary | ICD-10-CM | POA: Diagnosis not present

## 2017-03-05 DIAGNOSIS — M25562 Pain in left knee: Secondary | ICD-10-CM | POA: Diagnosis not present

## 2017-03-26 DIAGNOSIS — B029 Zoster without complications: Secondary | ICD-10-CM | POA: Diagnosis not present

## 2017-04-19 DIAGNOSIS — Z79899 Other long term (current) drug therapy: Secondary | ICD-10-CM | POA: Diagnosis not present

## 2017-04-19 DIAGNOSIS — Z0000 Encounter for general adult medical examination without abnormal findings: Secondary | ICD-10-CM | POA: Diagnosis not present

## 2017-04-19 DIAGNOSIS — R05 Cough: Secondary | ICD-10-CM | POA: Diagnosis not present

## 2017-04-25 DIAGNOSIS — R05 Cough: Secondary | ICD-10-CM | POA: Diagnosis not present

## 2017-04-25 DIAGNOSIS — M461 Sacroiliitis, not elsewhere classified: Secondary | ICD-10-CM | POA: Diagnosis not present

## 2017-04-25 DIAGNOSIS — K219 Gastro-esophageal reflux disease without esophagitis: Secondary | ICD-10-CM | POA: Diagnosis not present

## 2017-04-25 DIAGNOSIS — M50322 Other cervical disc degeneration at C5-C6 level: Secondary | ICD-10-CM | POA: Diagnosis not present

## 2017-04-25 DIAGNOSIS — R1313 Dysphagia, pharyngeal phase: Secondary | ICD-10-CM | POA: Diagnosis not present

## 2017-04-25 DIAGNOSIS — Z1212 Encounter for screening for malignant neoplasm of rectum: Secondary | ICD-10-CM | POA: Diagnosis not present

## 2017-04-25 DIAGNOSIS — K5909 Other constipation: Secondary | ICD-10-CM | POA: Diagnosis not present

## 2017-04-25 DIAGNOSIS — F418 Other specified anxiety disorders: Secondary | ICD-10-CM | POA: Diagnosis not present

## 2017-04-25 DIAGNOSIS — Z681 Body mass index (BMI) 19 or less, adult: Secondary | ICD-10-CM | POA: Diagnosis not present

## 2017-04-25 DIAGNOSIS — D869 Sarcoidosis, unspecified: Secondary | ICD-10-CM | POA: Diagnosis not present

## 2017-04-25 DIAGNOSIS — M9905 Segmental and somatic dysfunction of pelvic region: Secondary | ICD-10-CM | POA: Diagnosis not present

## 2017-04-25 DIAGNOSIS — J302 Other seasonal allergic rhinitis: Secondary | ICD-10-CM | POA: Diagnosis not present

## 2017-04-25 DIAGNOSIS — M9902 Segmental and somatic dysfunction of thoracic region: Secondary | ICD-10-CM | POA: Diagnosis not present

## 2017-04-25 DIAGNOSIS — G4709 Other insomnia: Secondary | ICD-10-CM | POA: Diagnosis not present

## 2017-04-25 DIAGNOSIS — Z0000 Encounter for general adult medical examination without abnormal findings: Secondary | ICD-10-CM | POA: Diagnosis not present

## 2017-04-25 DIAGNOSIS — M5384 Other specified dorsopathies, thoracic region: Secondary | ICD-10-CM | POA: Diagnosis not present

## 2017-04-25 DIAGNOSIS — M79632 Pain in left forearm: Secondary | ICD-10-CM | POA: Diagnosis not present

## 2017-04-25 DIAGNOSIS — Z1389 Encounter for screening for other disorder: Secondary | ICD-10-CM | POA: Diagnosis not present

## 2017-04-25 DIAGNOSIS — M79631 Pain in right forearm: Secondary | ICD-10-CM | POA: Diagnosis not present

## 2017-04-25 DIAGNOSIS — M9903 Segmental and somatic dysfunction of lumbar region: Secondary | ICD-10-CM | POA: Diagnosis not present

## 2017-04-25 DIAGNOSIS — M5137 Other intervertebral disc degeneration, lumbosacral region: Secondary | ICD-10-CM | POA: Diagnosis not present

## 2017-04-25 DIAGNOSIS — M9904 Segmental and somatic dysfunction of sacral region: Secondary | ICD-10-CM | POA: Diagnosis not present

## 2017-04-25 DIAGNOSIS — M9901 Segmental and somatic dysfunction of cervical region: Secondary | ICD-10-CM | POA: Diagnosis not present

## 2017-05-28 DIAGNOSIS — M9903 Segmental and somatic dysfunction of lumbar region: Secondary | ICD-10-CM | POA: Diagnosis not present

## 2017-05-28 DIAGNOSIS — M9904 Segmental and somatic dysfunction of sacral region: Secondary | ICD-10-CM | POA: Diagnosis not present

## 2017-05-28 DIAGNOSIS — M461 Sacroiliitis, not elsewhere classified: Secondary | ICD-10-CM | POA: Diagnosis not present

## 2017-05-28 DIAGNOSIS — M7918 Myalgia, other site: Secondary | ICD-10-CM | POA: Diagnosis not present

## 2017-05-28 DIAGNOSIS — M79632 Pain in left forearm: Secondary | ICD-10-CM | POA: Diagnosis not present

## 2017-05-28 DIAGNOSIS — M5137 Other intervertebral disc degeneration, lumbosacral region: Secondary | ICD-10-CM | POA: Diagnosis not present

## 2017-05-28 DIAGNOSIS — M50322 Other cervical disc degeneration at C5-C6 level: Secondary | ICD-10-CM | POA: Diagnosis not present

## 2017-05-28 DIAGNOSIS — M9905 Segmental and somatic dysfunction of pelvic region: Secondary | ICD-10-CM | POA: Diagnosis not present

## 2017-05-28 DIAGNOSIS — M79631 Pain in right forearm: Secondary | ICD-10-CM | POA: Diagnosis not present

## 2017-05-28 DIAGNOSIS — M9902 Segmental and somatic dysfunction of thoracic region: Secondary | ICD-10-CM | POA: Diagnosis not present

## 2017-05-28 DIAGNOSIS — M5384 Other specified dorsopathies, thoracic region: Secondary | ICD-10-CM | POA: Diagnosis not present

## 2017-05-28 DIAGNOSIS — M9901 Segmental and somatic dysfunction of cervical region: Secondary | ICD-10-CM | POA: Diagnosis not present

## 2017-05-29 DIAGNOSIS — F902 Attention-deficit hyperactivity disorder, combined type: Secondary | ICD-10-CM | POA: Diagnosis not present

## 2017-05-29 DIAGNOSIS — F411 Generalized anxiety disorder: Secondary | ICD-10-CM | POA: Diagnosis not present

## 2017-07-02 DIAGNOSIS — M9902 Segmental and somatic dysfunction of thoracic region: Secondary | ICD-10-CM | POA: Diagnosis not present

## 2017-07-02 DIAGNOSIS — Z124 Encounter for screening for malignant neoplasm of cervix: Secondary | ICD-10-CM | POA: Diagnosis not present

## 2017-07-02 DIAGNOSIS — Z01419 Encounter for gynecological examination (general) (routine) without abnormal findings: Secondary | ICD-10-CM | POA: Diagnosis not present

## 2017-07-02 DIAGNOSIS — M5384 Other specified dorsopathies, thoracic region: Secondary | ICD-10-CM | POA: Diagnosis not present

## 2017-07-02 DIAGNOSIS — R350 Frequency of micturition: Secondary | ICD-10-CM | POA: Diagnosis not present

## 2017-07-02 DIAGNOSIS — M9901 Segmental and somatic dysfunction of cervical region: Secondary | ICD-10-CM | POA: Diagnosis not present

## 2017-07-02 DIAGNOSIS — M50322 Other cervical disc degeneration at C5-C6 level: Secondary | ICD-10-CM | POA: Diagnosis not present

## 2017-07-02 DIAGNOSIS — M461 Sacroiliitis, not elsewhere classified: Secondary | ICD-10-CM | POA: Diagnosis not present

## 2017-07-02 DIAGNOSIS — Z1151 Encounter for screening for human papillomavirus (HPV): Secondary | ICD-10-CM | POA: Diagnosis not present

## 2017-07-02 DIAGNOSIS — Z1231 Encounter for screening mammogram for malignant neoplasm of breast: Secondary | ICD-10-CM | POA: Diagnosis not present

## 2017-07-02 DIAGNOSIS — M9904 Segmental and somatic dysfunction of sacral region: Secondary | ICD-10-CM | POA: Diagnosis not present

## 2017-07-02 DIAGNOSIS — M9903 Segmental and somatic dysfunction of lumbar region: Secondary | ICD-10-CM | POA: Diagnosis not present

## 2017-07-02 DIAGNOSIS — M5137 Other intervertebral disc degeneration, lumbosacral region: Secondary | ICD-10-CM | POA: Diagnosis not present

## 2017-07-02 DIAGNOSIS — M9905 Segmental and somatic dysfunction of pelvic region: Secondary | ICD-10-CM | POA: Diagnosis not present

## 2017-07-04 DIAGNOSIS — M50322 Other cervical disc degeneration at C5-C6 level: Secondary | ICD-10-CM | POA: Diagnosis not present

## 2017-07-04 DIAGNOSIS — M9901 Segmental and somatic dysfunction of cervical region: Secondary | ICD-10-CM | POA: Diagnosis not present

## 2017-07-04 DIAGNOSIS — M461 Sacroiliitis, not elsewhere classified: Secondary | ICD-10-CM | POA: Diagnosis not present

## 2017-07-04 DIAGNOSIS — M9905 Segmental and somatic dysfunction of pelvic region: Secondary | ICD-10-CM | POA: Diagnosis not present

## 2017-07-04 DIAGNOSIS — M9904 Segmental and somatic dysfunction of sacral region: Secondary | ICD-10-CM | POA: Diagnosis not present

## 2017-07-04 DIAGNOSIS — M5137 Other intervertebral disc degeneration, lumbosacral region: Secondary | ICD-10-CM | POA: Diagnosis not present

## 2017-07-04 DIAGNOSIS — M9902 Segmental and somatic dysfunction of thoracic region: Secondary | ICD-10-CM | POA: Diagnosis not present

## 2017-07-04 DIAGNOSIS — M5384 Other specified dorsopathies, thoracic region: Secondary | ICD-10-CM | POA: Diagnosis not present

## 2017-07-04 DIAGNOSIS — M9903 Segmental and somatic dysfunction of lumbar region: Secondary | ICD-10-CM | POA: Diagnosis not present

## 2017-07-09 DIAGNOSIS — M461 Sacroiliitis, not elsewhere classified: Secondary | ICD-10-CM | POA: Diagnosis not present

## 2017-07-09 DIAGNOSIS — M9901 Segmental and somatic dysfunction of cervical region: Secondary | ICD-10-CM | POA: Diagnosis not present

## 2017-07-09 DIAGNOSIS — M9904 Segmental and somatic dysfunction of sacral region: Secondary | ICD-10-CM | POA: Diagnosis not present

## 2017-07-09 DIAGNOSIS — M9903 Segmental and somatic dysfunction of lumbar region: Secondary | ICD-10-CM | POA: Diagnosis not present

## 2017-07-09 DIAGNOSIS — M9902 Segmental and somatic dysfunction of thoracic region: Secondary | ICD-10-CM | POA: Diagnosis not present

## 2017-07-09 DIAGNOSIS — M50322 Other cervical disc degeneration at C5-C6 level: Secondary | ICD-10-CM | POA: Diagnosis not present

## 2017-07-09 DIAGNOSIS — M9905 Segmental and somatic dysfunction of pelvic region: Secondary | ICD-10-CM | POA: Diagnosis not present

## 2017-07-09 DIAGNOSIS — M5137 Other intervertebral disc degeneration, lumbosacral region: Secondary | ICD-10-CM | POA: Diagnosis not present

## 2017-07-09 DIAGNOSIS — M5384 Other specified dorsopathies, thoracic region: Secondary | ICD-10-CM | POA: Diagnosis not present

## 2017-07-11 DIAGNOSIS — M9905 Segmental and somatic dysfunction of pelvic region: Secondary | ICD-10-CM | POA: Diagnosis not present

## 2017-07-11 DIAGNOSIS — M50322 Other cervical disc degeneration at C5-C6 level: Secondary | ICD-10-CM | POA: Diagnosis not present

## 2017-07-11 DIAGNOSIS — M9901 Segmental and somatic dysfunction of cervical region: Secondary | ICD-10-CM | POA: Diagnosis not present

## 2017-07-11 DIAGNOSIS — M461 Sacroiliitis, not elsewhere classified: Secondary | ICD-10-CM | POA: Diagnosis not present

## 2017-07-11 DIAGNOSIS — M5384 Other specified dorsopathies, thoracic region: Secondary | ICD-10-CM | POA: Diagnosis not present

## 2017-07-11 DIAGNOSIS — M9902 Segmental and somatic dysfunction of thoracic region: Secondary | ICD-10-CM | POA: Diagnosis not present

## 2017-07-11 DIAGNOSIS — M9903 Segmental and somatic dysfunction of lumbar region: Secondary | ICD-10-CM | POA: Diagnosis not present

## 2017-07-11 DIAGNOSIS — M9904 Segmental and somatic dysfunction of sacral region: Secondary | ICD-10-CM | POA: Diagnosis not present

## 2017-07-11 DIAGNOSIS — M5137 Other intervertebral disc degeneration, lumbosacral region: Secondary | ICD-10-CM | POA: Diagnosis not present

## 2017-07-15 DIAGNOSIS — M5137 Other intervertebral disc degeneration, lumbosacral region: Secondary | ICD-10-CM | POA: Diagnosis not present

## 2017-07-15 DIAGNOSIS — M9901 Segmental and somatic dysfunction of cervical region: Secondary | ICD-10-CM | POA: Diagnosis not present

## 2017-07-15 DIAGNOSIS — M461 Sacroiliitis, not elsewhere classified: Secondary | ICD-10-CM | POA: Diagnosis not present

## 2017-07-15 DIAGNOSIS — M9902 Segmental and somatic dysfunction of thoracic region: Secondary | ICD-10-CM | POA: Diagnosis not present

## 2017-07-15 DIAGNOSIS — M9903 Segmental and somatic dysfunction of lumbar region: Secondary | ICD-10-CM | POA: Diagnosis not present

## 2017-07-15 DIAGNOSIS — M50322 Other cervical disc degeneration at C5-C6 level: Secondary | ICD-10-CM | POA: Diagnosis not present

## 2017-07-15 DIAGNOSIS — M9905 Segmental and somatic dysfunction of pelvic region: Secondary | ICD-10-CM | POA: Diagnosis not present

## 2017-07-15 DIAGNOSIS — M5384 Other specified dorsopathies, thoracic region: Secondary | ICD-10-CM | POA: Diagnosis not present

## 2017-07-15 DIAGNOSIS — M9904 Segmental and somatic dysfunction of sacral region: Secondary | ICD-10-CM | POA: Diagnosis not present

## 2017-07-17 DIAGNOSIS — M9901 Segmental and somatic dysfunction of cervical region: Secondary | ICD-10-CM | POA: Diagnosis not present

## 2017-07-17 DIAGNOSIS — M50322 Other cervical disc degeneration at C5-C6 level: Secondary | ICD-10-CM | POA: Diagnosis not present

## 2017-07-17 DIAGNOSIS — M9903 Segmental and somatic dysfunction of lumbar region: Secondary | ICD-10-CM | POA: Diagnosis not present

## 2017-07-17 DIAGNOSIS — M9904 Segmental and somatic dysfunction of sacral region: Secondary | ICD-10-CM | POA: Diagnosis not present

## 2017-07-17 DIAGNOSIS — M5137 Other intervertebral disc degeneration, lumbosacral region: Secondary | ICD-10-CM | POA: Diagnosis not present

## 2017-07-17 DIAGNOSIS — M5384 Other specified dorsopathies, thoracic region: Secondary | ICD-10-CM | POA: Diagnosis not present

## 2017-07-17 DIAGNOSIS — M9902 Segmental and somatic dysfunction of thoracic region: Secondary | ICD-10-CM | POA: Diagnosis not present

## 2017-07-17 DIAGNOSIS — M9905 Segmental and somatic dysfunction of pelvic region: Secondary | ICD-10-CM | POA: Diagnosis not present

## 2017-07-17 DIAGNOSIS — M461 Sacroiliitis, not elsewhere classified: Secondary | ICD-10-CM | POA: Diagnosis not present

## 2017-07-23 DIAGNOSIS — M9901 Segmental and somatic dysfunction of cervical region: Secondary | ICD-10-CM | POA: Diagnosis not present

## 2017-07-23 DIAGNOSIS — M461 Sacroiliitis, not elsewhere classified: Secondary | ICD-10-CM | POA: Diagnosis not present

## 2017-07-23 DIAGNOSIS — M9905 Segmental and somatic dysfunction of pelvic region: Secondary | ICD-10-CM | POA: Diagnosis not present

## 2017-07-23 DIAGNOSIS — M50322 Other cervical disc degeneration at C5-C6 level: Secondary | ICD-10-CM | POA: Diagnosis not present

## 2017-07-23 DIAGNOSIS — M9904 Segmental and somatic dysfunction of sacral region: Secondary | ICD-10-CM | POA: Diagnosis not present

## 2017-07-23 DIAGNOSIS — M9902 Segmental and somatic dysfunction of thoracic region: Secondary | ICD-10-CM | POA: Diagnosis not present

## 2017-07-23 DIAGNOSIS — M9903 Segmental and somatic dysfunction of lumbar region: Secondary | ICD-10-CM | POA: Diagnosis not present

## 2017-07-23 DIAGNOSIS — M5137 Other intervertebral disc degeneration, lumbosacral region: Secondary | ICD-10-CM | POA: Diagnosis not present

## 2017-07-23 DIAGNOSIS — M5384 Other specified dorsopathies, thoracic region: Secondary | ICD-10-CM | POA: Diagnosis not present

## 2017-07-30 DIAGNOSIS — M50322 Other cervical disc degeneration at C5-C6 level: Secondary | ICD-10-CM | POA: Diagnosis not present

## 2017-07-30 DIAGNOSIS — M9902 Segmental and somatic dysfunction of thoracic region: Secondary | ICD-10-CM | POA: Diagnosis not present

## 2017-07-30 DIAGNOSIS — M461 Sacroiliitis, not elsewhere classified: Secondary | ICD-10-CM | POA: Diagnosis not present

## 2017-07-30 DIAGNOSIS — M9901 Segmental and somatic dysfunction of cervical region: Secondary | ICD-10-CM | POA: Diagnosis not present

## 2017-08-07 DIAGNOSIS — M9901 Segmental and somatic dysfunction of cervical region: Secondary | ICD-10-CM | POA: Diagnosis not present

## 2017-08-07 DIAGNOSIS — M9902 Segmental and somatic dysfunction of thoracic region: Secondary | ICD-10-CM | POA: Diagnosis not present

## 2017-08-07 DIAGNOSIS — M9904 Segmental and somatic dysfunction of sacral region: Secondary | ICD-10-CM | POA: Diagnosis not present

## 2017-08-07 DIAGNOSIS — M9903 Segmental and somatic dysfunction of lumbar region: Secondary | ICD-10-CM | POA: Diagnosis not present

## 2017-08-07 DIAGNOSIS — M50322 Other cervical disc degeneration at C5-C6 level: Secondary | ICD-10-CM | POA: Diagnosis not present

## 2017-08-07 DIAGNOSIS — M5384 Other specified dorsopathies, thoracic region: Secondary | ICD-10-CM | POA: Diagnosis not present

## 2017-08-07 DIAGNOSIS — M5137 Other intervertebral disc degeneration, lumbosacral region: Secondary | ICD-10-CM | POA: Diagnosis not present

## 2017-08-07 DIAGNOSIS — M461 Sacroiliitis, not elsewhere classified: Secondary | ICD-10-CM | POA: Diagnosis not present

## 2017-08-07 DIAGNOSIS — M9905 Segmental and somatic dysfunction of pelvic region: Secondary | ICD-10-CM | POA: Diagnosis not present

## 2017-08-14 DIAGNOSIS — M9901 Segmental and somatic dysfunction of cervical region: Secondary | ICD-10-CM | POA: Diagnosis not present

## 2017-08-14 DIAGNOSIS — M9903 Segmental and somatic dysfunction of lumbar region: Secondary | ICD-10-CM | POA: Diagnosis not present

## 2017-08-14 DIAGNOSIS — M461 Sacroiliitis, not elsewhere classified: Secondary | ICD-10-CM | POA: Diagnosis not present

## 2017-08-14 DIAGNOSIS — M9902 Segmental and somatic dysfunction of thoracic region: Secondary | ICD-10-CM | POA: Diagnosis not present

## 2017-08-14 DIAGNOSIS — M50322 Other cervical disc degeneration at C5-C6 level: Secondary | ICD-10-CM | POA: Diagnosis not present

## 2017-08-14 DIAGNOSIS — M9904 Segmental and somatic dysfunction of sacral region: Secondary | ICD-10-CM | POA: Diagnosis not present

## 2017-08-21 ENCOUNTER — Other Ambulatory Visit: Payer: Self-pay | Admitting: Internal Medicine

## 2017-08-21 DIAGNOSIS — R1030 Lower abdominal pain, unspecified: Secondary | ICD-10-CM | POA: Diagnosis not present

## 2017-08-21 DIAGNOSIS — R509 Fever, unspecified: Secondary | ICD-10-CM | POA: Diagnosis not present

## 2017-08-21 DIAGNOSIS — R197 Diarrhea, unspecified: Secondary | ICD-10-CM

## 2017-08-21 DIAGNOSIS — Z681 Body mass index (BMI) 19 or less, adult: Secondary | ICD-10-CM | POA: Diagnosis not present

## 2017-08-21 DIAGNOSIS — R1032 Left lower quadrant pain: Secondary | ICD-10-CM

## 2017-08-21 DIAGNOSIS — R5383 Other fatigue: Secondary | ICD-10-CM | POA: Diagnosis not present

## 2017-08-22 ENCOUNTER — Ambulatory Visit
Admission: RE | Admit: 2017-08-22 | Discharge: 2017-08-22 | Disposition: A | Discharge status: Home / Self Care | Payer: Medicare Other | Source: Ambulatory Visit | Attending: Internal Medicine | Admitting: Internal Medicine

## 2017-08-22 DIAGNOSIS — R197 Diarrhea, unspecified: Secondary | ICD-10-CM

## 2017-08-22 DIAGNOSIS — R1032 Left lower quadrant pain: Secondary | ICD-10-CM

## 2017-08-22 DIAGNOSIS — K5900 Constipation, unspecified: Secondary | ICD-10-CM | POA: Diagnosis not present

## 2017-08-22 DIAGNOSIS — R509 Fever, unspecified: Secondary | ICD-10-CM

## 2017-08-22 MED ORDER — IOPAMIDOL (ISOVUE-300) INJECTION 61%
75.0000 mL | Freq: Once | INTRAVENOUS | Status: AC | PRN
Start: 2017-08-22 — End: 2017-08-22
  Administered 2017-08-22: 75 mL via INTRAVENOUS

## 2017-08-27 DIAGNOSIS — R05 Cough: Secondary | ICD-10-CM | POA: Diagnosis not present

## 2017-08-27 DIAGNOSIS — M461 Sacroiliitis, not elsewhere classified: Secondary | ICD-10-CM | POA: Diagnosis not present

## 2017-08-27 DIAGNOSIS — M9901 Segmental and somatic dysfunction of cervical region: Secondary | ICD-10-CM | POA: Diagnosis not present

## 2017-08-27 DIAGNOSIS — M50322 Other cervical disc degeneration at C5-C6 level: Secondary | ICD-10-CM | POA: Diagnosis not present

## 2017-08-27 DIAGNOSIS — M9904 Segmental and somatic dysfunction of sacral region: Secondary | ICD-10-CM | POA: Diagnosis not present

## 2017-08-27 DIAGNOSIS — M9903 Segmental and somatic dysfunction of lumbar region: Secondary | ICD-10-CM | POA: Diagnosis not present

## 2017-08-27 DIAGNOSIS — M9902 Segmental and somatic dysfunction of thoracic region: Secondary | ICD-10-CM | POA: Diagnosis not present

## 2017-08-28 DIAGNOSIS — F411 Generalized anxiety disorder: Secondary | ICD-10-CM | POA: Diagnosis not present

## 2017-08-28 DIAGNOSIS — F902 Attention-deficit hyperactivity disorder, combined type: Secondary | ICD-10-CM | POA: Diagnosis not present

## 2017-09-11 DIAGNOSIS — J3081 Allergic rhinitis due to animal (cat) (dog) hair and dander: Secondary | ICD-10-CM | POA: Diagnosis not present

## 2017-09-11 DIAGNOSIS — J301 Allergic rhinitis due to pollen: Secondary | ICD-10-CM | POA: Diagnosis not present

## 2017-09-11 DIAGNOSIS — J3089 Other allergic rhinitis: Secondary | ICD-10-CM | POA: Diagnosis not present

## 2017-09-11 DIAGNOSIS — R05 Cough: Secondary | ICD-10-CM | POA: Diagnosis not present

## 2017-10-04 ENCOUNTER — Encounter: Payer: Self-pay | Admitting: Pulmonary Disease

## 2017-10-04 ENCOUNTER — Ambulatory Visit (INDEPENDENT_AMBULATORY_CARE_PROVIDER_SITE_OTHER): Payer: BLUE CROSS/BLUE SHIELD | Admitting: Pulmonary Disease

## 2017-10-04 VITALS — BP 98/74 | HR 95 | Ht 61.0 in | Wt 99.8 lb

## 2017-10-04 DIAGNOSIS — D869 Sarcoidosis, unspecified: Secondary | ICD-10-CM

## 2017-10-04 DIAGNOSIS — R002 Palpitations: Secondary | ICD-10-CM

## 2017-10-04 NOTE — Progress Notes (Signed)
[facility]  Chief Complaint  Patient presents with  . pulm consult    Pt thinks sarcoid is back, pt more fatigued, cough, SOB    Vital signs: BP 98/74 (BP Location: Left Arm, Cuff Size: Normal)   Pulse 95   Ht  (1.549 m)   Wt 99 lb 12.8 oz (45.3 kg)   SpO2 100%   BMI 18.86 kg/m   History of Present Illness: [name] [name] is a 52 y.o. female with shortness of breath.  I saw her last in 2008.  At that time she was being followed for history of sarcoidosis, sleep apnea, and restless legs.  Since then she developed right leg injury and regional pain syndrome.  She also has elevated ANA, but no other findings of connective tissue disease.  She has feeling like her throat is closing and gets stuck.  She has been to GI, ENT, and allergy.  She has been tried on multiple therapies w/o benefit.  She had CT abd/pelvis 08/22/17 that showed 3 mm nodule RLL.  She is not having fever, hemoptysis, joint swelling, skin rash, fever.  She feels like her heart races at times.  She is not sure if this occurs when she has globus sensation.   Physical Exam:  General - pleasant Eyes - pupils reactive ENT - no sinus tenderness, no oral exudate, no LAN Cardiac - regular, no murmur Chest - no wheeze, rales Abd - soft, non tender Ext - no edema Skin - no rashes Neuro - normal strength Psych - normal mood   ECG today showed sinus rhythm.   Discussion: Cough, dyspnea, palpitations, globus sensation.  She has hx of sarcoidosis.  Recent CT abdomen showed rt lower lobe nodule.  Plan: - will arrange for CT chest - will arrange for PFT - might need bronchoscopy for upper airway and lower airway inspection   Patient Instructions  Will get copy of medical records from Dr. Zelphia Cairo office  Will schedule CT chest and pulmonary function tests  Follow up in 2 to 3 weeks with Dr. Craige Cotta or Nurse Practitioner    Coralyn Helling, MD [facility]  Pulmonary/Critical Care 10/04/2017, 2:11 PM  Flow Sheet  Pulmonary tests:  Review of Systems: Constitutional: Negative for fever and unexpected weight change.  HENT: Positive for postnasal drip and trouble swallowing. Negative for congestion, dental problem, ear pain, nosebleeds, rhinorrhea, sinus pressure, sneezing and sore throat.   Eyes: Negative for redness and itching.  Respiratory: Positive for cough, chest tightness and shortness of breath. Negative for wheezing.   Cardiovascular: Negative for palpitations and leg swelling.  Gastrointestinal: Positive for constipation. Negative for nausea and vomiting.  Genitourinary: Negative for dysuria.  Musculoskeletal: Negative for joint swelling.  Skin: Negative for rash.  Allergic/Immunologic: Positive for environmental allergies. Negative for food allergies and immunocompromised state.  Neurological: Positive for headaches.  Hematological: Does not bruise/bleed easily.  Psychiatric/Behavioral: Negative for dysphoric mood. The patient is nervous/anxious.    Past Medical History: She  has a past medical history of Anxiety and Migraine headache.  Past Surgical History: She  has a past surgical history that includes Tonsillectomy and adenoidectomy; Cholecystectomy; Thoracoscopy; Dilation and curettage of uterus; Wisdom tooth extraction; and Shoulder arthroscopy (Left, 2008).  Family History: Her family history includes Cancer in her mother; Diabetes in her mother; Heart disease in her father; Hypertension in her mother.  Social History: She  reports that she has never smoked. She has never used smokeless tobacco.  She reports that she drinks alcohol. She reports that she does not use drugs.  Medications: Allergies as of 10/04/2017      Reactions   Levaquin [levofloxacin In D5w]    Severe rash   Metoclopramide Hcl    Penicillins    Reglan [metoclopramide] Anxiety      Medication List        Accurate as of 10/04/17  2:11 PM. Always  use your most recent med list.          ADZENYS XR-ODT 18.8 MG Tbed Generic drug:  Amphetamine ER Take 1 tablet by mouth daily.   ALPRAZolam 0.5 MG tablet Commonly known as:  XANAX Take 0.5 mg by mouth at bedtime as needed for anxiety.   azelastine 0.05 % ophthalmic solution Commonly known as:  OPTIVAR 1 drop 2 (two) times daily.   estradiol-norethindrone 1-0.5 MG tablet Commonly known as:  ACTIVELLA Take 1 tablet by mouth daily.   fluticasone 50 MCG/ACT nasal spray Commonly known as:  FLONASE Place 1 spray into both nostrils daily.   ipratropium 0.06 % nasal spray Commonly known as:  ATROVENT Place 2 sprays into both nostrils 4 (four) times daily.   SPIRIVA RESPIMAT 1.25 MCG/ACT Aers Generic drug:  Tiotropium Bromide Monohydrate Inhale 2 puffs into the lungs 2 (two) times daily.   traMADol 50 MG tablet Commonly known as:  ULTRAM Take 50 mg by mouth every 6 (six) hours as needed.   traZODone 50 MG tablet Commonly known as:  DESYREL Take 75 mg by mouth at bedtime. Takes 50-100 mg every night at bedtime.   ZOLOFT 100 MG tablet Generic drug:  sertraline Take 100 mg by mouth daily.

## 2017-10-04 NOTE — Patient Instructions (Signed)
Will get copy of medical records from Dr. Zelphia CairoWhelan's office  Will schedule CT chest and pulmonary function tests  Follow up in 2 to 3 weeks with Dr. Craige CottaSood or Nurse Practitioner

## 2017-10-04 NOTE — Progress Notes (Signed)
   Subjective:    Patient ID: [name] [name], female    DOB: 06/14/1965, 52 y.o.   MRN: [id number]  HPI    Review of Systems  Constitutional: Negative for fever and unexpected weight change.  HENT: Positive for postnasal drip and trouble swallowing. Negative for congestion, dental problem, ear pain, nosebleeds, rhinorrhea, sinus pressure, sneezing and sore throat.   Eyes: Negative for redness and itching.  Respiratory: Positive for cough, chest tightness and shortness of breath. Negative for wheezing.   Cardiovascular: Negative for palpitations and leg swelling.  Gastrointestinal: Positive for constipation. Negative for nausea and vomiting.  Genitourinary: Negative for dysuria.  Musculoskeletal: Negative for joint swelling.  Skin: Negative for rash.  Allergic/Immunologic: Positive for environmental allergies. Negative for food allergies and immunocompromised state.  Neurological: Positive for headaches.  Hematological: Does not bruise/bleed easily.  Psychiatric/Behavioral: Negative for dysphoric mood. The patient is nervous/anxious.        Objective:   Physical Exam        Assessment & Plan:

## 2017-10-09 ENCOUNTER — Telehealth: Payer: Self-pay | Admitting: Pulmonary Disease

## 2017-10-09 DIAGNOSIS — D869 Sarcoidosis, unspecified: Secondary | ICD-10-CM

## 2017-10-09 NOTE — Telephone Encounter (Signed)
Called and spoke to pt.  Pt is requesting to switch CT location to an outpatient facility for 10/16/17. Currently scheduled for [place] [facility], per pt they do not bill as outpatient.   PCC's can you guys help with this. Thanks

## 2017-10-10 NOTE — Telephone Encounter (Signed)
Per Johny Drilling new order needs to be placed for the preferred imaging location to be at [facility] Previous order cancelled and replaced

## 2017-10-15 ENCOUNTER — Ambulatory Visit (HOSPITAL_COMMUNITY): Payer: Medicare Other

## 2017-10-16 ENCOUNTER — Other Ambulatory Visit: Payer: Medicare Other

## 2017-10-17 ENCOUNTER — Ambulatory Visit (HOSPITAL_COMMUNITY): Payer: Medicare Other

## 2017-10-22 ENCOUNTER — Encounter (HOSPITAL_COMMUNITY): Payer: Self-pay

## 2017-10-22 ENCOUNTER — Ambulatory Visit (HOSPITAL_COMMUNITY)
Admission: RE | Admit: 2017-10-22 | Discharge: 2017-10-22 | Disposition: A | Discharge status: Home / Self Care | Payer: BLUE CROSS/BLUE SHIELD | Source: Ambulatory Visit | Attending: Pulmonary Disease | Admitting: Pulmonary Disease

## 2017-10-22 DIAGNOSIS — Q278 Other specified congenital malformations of peripheral vascular system: Secondary | ICD-10-CM | POA: Insufficient documentation

## 2017-10-22 DIAGNOSIS — R0602 Shortness of breath: Secondary | ICD-10-CM | POA: Diagnosis not present

## 2017-10-22 DIAGNOSIS — D869 Sarcoidosis, unspecified: Secondary | ICD-10-CM | POA: Insufficient documentation

## 2017-10-24 ENCOUNTER — Telehealth: Payer: Self-pay | Admitting: Pulmonary Disease

## 2017-10-24 NOTE — Telephone Encounter (Signed)
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  No evidence for sarcoidosis, and otherwise normal scan.

## 2017-10-25 NOTE — Telephone Encounter (Signed)
Left voice mail on machine for patient to return phone call back regarding results. X1  

## 2017-10-25 NOTE — Telephone Encounter (Signed)
Patient returning call - she can be reached at [phone]

## 2017-10-25 NOTE — Telephone Encounter (Signed)
Called and spoke with patient, she is aware of results and verbalized understanding. Nothing further needed.  

## 2017-10-30 ENCOUNTER — Ambulatory Visit (INDEPENDENT_AMBULATORY_CARE_PROVIDER_SITE_OTHER): Payer: BLUE CROSS/BLUE SHIELD | Admitting: Adult Health

## 2017-10-30 ENCOUNTER — Ambulatory Visit (INDEPENDENT_AMBULATORY_CARE_PROVIDER_SITE_OTHER): Payer: BLUE CROSS/BLUE SHIELD | Admitting: Pulmonary Disease

## 2017-10-30 ENCOUNTER — Encounter: Payer: Self-pay | Admitting: Adult Health

## 2017-10-30 DIAGNOSIS — R911 Solitary pulmonary nodule: Secondary | ICD-10-CM | POA: Diagnosis not present

## 2017-10-30 DIAGNOSIS — K219 Gastro-esophageal reflux disease without esophagitis: Secondary | ICD-10-CM | POA: Insufficient documentation

## 2017-10-30 DIAGNOSIS — D869 Sarcoidosis, unspecified: Secondary | ICD-10-CM

## 2017-10-30 LAB — PULMONARY FUNCTION TEST
DL/VA % pred: 80 %
DL/VA: 3.54 ml/min/mmHg/L
DLCO UNC % PRED: 80 %
DLCO unc: 16.33 ml/min/mmHg
FEF 25-75 POST: 2.84 L/s
FEF 25-75 Pre: 2.16 L/sec
FEF2575-%Change-Post: 31 %
FEF2575-%PRED-PRE: 85 %
FEF2575-%Pred-Post: 112 %
FEV1-%Change-Post: 5 %
FEV1-%PRED-POST: 106 %
FEV1-%Pred-Pre: 101 %
FEV1-PRE: 2.53 L
FEV1-Post: 2.66 L
FEV1FVC-%CHANGE-POST: 6 %
FEV1FVC-%Pred-Pre: 97 %
FEV6-%Change-Post: 0 %
FEV6-%PRED-POST: 104 %
FEV6-%Pred-Pre: 105 %
FEV6-Post: 3.21 L
FEV6-Pre: 3.24 L
FEV6FVC-%Pred-Post: 103 %
FEV6FVC-%Pred-Pre: 103 %
FVC-%Change-Post: 0 %
FVC-%Pred-Post: 101 %
FVC-%Pred-Pre: 102 %
FVC-Post: 3.21 L
FVC-Pre: 3.24 L
POST FEV6/FVC RATIO: 100 %
PRE FEV1/FVC RATIO: 78 %
Post FEV1/FVC ratio: 83 %
Pre FEV6/FVC Ratio: 100 %
RV % pred: 92 %
RV: 1.54 L
TLC % pred: 102 %
TLC: 4.72 L

## 2017-10-30 NOTE — Progress Notes (Signed)
_0  ID: [name] [name], female    DOB: 09-Feb-1966, 52 y.o.   MRN: [id number]  Chief Complaint  Patient presents with  . Follow-up    Dsypnea     Referring provider: Haywood Pao, MD  HPI: 52 year old female never smoker seen for pulmonary consult October 04, 2017 for cough and shortness of breath. History Sarcoid   10/30/2017 Follow up: Cough [name] [name] /Sarcoid  Patient returns for a one-month follow-up.  She was seen last visit for a pulmonary consult for ongoing cough and shortness of breath.  Patient carries a diagnosis of sarcoidosis. Patient had been feeling that her throat was closing and getting stuck at times.  She been tried on multiple therapies and seen ENT allergy and GI. She was set up for a CT chest that was normal showing clear lungs no acute process and no evidence of sarcoidosis. PFT done today showed normal lung function with an FEV1 at 106%, ratio 83, FVC 101%.  DLCO 80%.  No significant bronchodilator response.  Mid flow reversibility. Patient says she does have intermittent episodes of GERD.  She had previously been on GERD therapy and allergy medicines but has not been taking lately. He does have to clear her throat often. She denies any chest pain orthopnea PND or increased leg swelling.         Allergies  Allergen Reactions  . Levaquin [Levofloxacin In D5w]     Severe rash  . Metoclopramide Hcl   . Penicillins   . Reglan [Metoclopramide] Anxiety    There is no immunization history for the selected administration types on file for this patient.  Past Medical History:  Diagnosis Date  . Anxiety   . Migraine headache     Tobacco History: Social History   Tobacco Use  Smoking Status Never Smoker  Smokeless Tobacco Never Used   Counseling given: Not Answered   Outpatient Encounter Medications as of 10/30/2017  Medication Sig  . ALPRAZolam (XANAX) 0.5 MG tablet Take 0.5 mg by mouth at bedtime as needed for anxiety.  .  Amphetamine ER (ADZENYS XR-ODT) 18.8 MG TBED Take 1 tablet by mouth daily.  Marland Kitchen azelastine (OPTIVAR) 0.05 % ophthalmic solution 1 drop 2 (two) times daily.  Marland Kitchen estradiol-norethindrone (ACTIVELLA) 1-0.5 MG tablet Take 1 tablet by mouth daily.  . fluticasone (FLONASE) 50 MCG/ACT nasal spray Place 1 spray into both nostrils daily.  Marland Kitchen ipratropium (ATROVENT) 0.06 % nasal spray Place 2 sprays into both nostrils 4 (four) times daily.  . sertraline (ZOLOFT) 100 MG tablet Take 100 mg by mouth daily.    . Tiotropium Bromide Monohydrate (SPIRIVA RESPIMAT) 1.25 MCG/ACT AERS Inhale 2 puffs into the lungs 2 (two) times daily.  . traMADol (ULTRAM) 50 MG tablet Take 50 mg by mouth every 6 (six) hours as needed.  . traZODone (DESYREL) 50 MG tablet Take 75 mg by mouth at bedtime. Takes 50-100 mg every night at bedtime.   No facility-administered encounter medications on file as of 10/30/2017.      Review of Systems  Constitutional:   No  weight loss, night sweats,  Fevers, chills, fatigue, or  lassitude.  HEENT:   No headaches,  Difficulty swallowing,  Tooth/dental problems, or  Sore throat,                No sneezing, itching, ear ache, nasal congestion, post nasal drip,   CV:  No chest pain,  Orthopnea, PND, swelling in lower extremities, anasarca, dizziness, palpitations, syncope.   GI  No heartburn, indigestion, abdominal pain, nausea, vomiting, diarrhea, change in bowel habits, loss of appetite, bloody stools.   Resp:   No chest wall deformity  Skin: no rash or lesions.  GU: no dysuria, change in color of urine, no urgency or frequency.  No flank pain, no hematuria   MS:  No joint pain or swelling.  No decreased range of motion.  No back pain.    Physical Exam  BP 108/68 (BP Location: Right Arm, Cuff Size: Normal)   Pulse (!) 108   Ht _0  (1.549 m)   Wt 97 lb 9.6 oz (44.3 kg)   LMP 01/05/2011   SpO2 100%   BMI 18.44 kg/m   GEN: A/Ox3; pleasant , NAD, thin    HEENT:  [place]/AT,   EACs-clear, TMs-wnl, NOSE-clear, THROAT-clear, no lesions, no postnasal drip or exudate noted.   NECK:  Supple w/ fair ROM; no JVD; normal carotid impulses w/o bruits; no thyromegaly or nodules palpated; no lymphadenopathy.    RESP  Clear  P & A; w/o, wheezes/ rales/ or rhonchi. no accessory muscle use, no dullness to percussion  CARD:  RRR, no m/r/g, no peripheral edema, pulses intact, no cyanosis or clubbing.  GI:   Soft & nt; nml bowel sounds; no organomegaly or masses detected.   Musco: Warm bil, no deformities or joint swelling noted.   Neuro: alert, no focal deficits noted.    Skin: Warm, no lesions or rashes    Lab Results:  CBC    Component Value Date/Time   WBC 7.8 03/05/2008 1300   RBC 4.73 03/05/2008 1300   HGB 13.9 03/05/2008 1300   HCT 41.5 03/05/2008 1300   PLT 240 03/05/2008 1300   MCV 87.7 03/05/2008 1300   MCHC 33.5 03/05/2008 1300   RDW 12.3 03/05/2008 1300   LYMPHSABS 1.5 03/27/2007 0927   MONOABS 0.5 03/27/2007 0927   EOSABS 0.2 03/27/2007 0927   BASOSABS 0.0 03/27/2007 0927    BMET    Component Value Date/Time   NA 137 03/05/2008 1300   K 3.9 03/05/2008 1300   CL 99 03/05/2008 1300   CO2 30 03/05/2008 1300   GLUCOSE 145 (H) 03/05/2008 1300   BUN 9 03/05/2008 1300   CREATININE 0.83 03/05/2008 1300   CALCIUM 9.4 03/05/2008 1300   GFRNONAA >60 03/05/2008 1300   GFRAA  03/05/2008 1300    >60        The eGFR has been calculated using the MDRD equation. This calculation has not been validated in all clinical    BNP No results found for: BNP  ProBNP No results found for: PROBNP  Imaging: Ct Chest Wo Contrast  Result Date: 10/22/2017 CLINICAL DATA:  Sarcoidosis.  Shortness of breath. EXAM: CT CHEST WITHOUT CONTRAST TECHNIQUE: Multidetector CT imaging of the chest was performed following the standard protocol without IV contrast. COMPARISON:  Plain film 09/08/2014.  Chest CT 06/18/2008. FINDINGS: Cardiovascular: Normal aortic caliber.  Aberrant right subclavian artery, traversing posterior to the esophagus. Normal heart size, without pericardial effusion. Mediastinum/Nodes: No supraclavicular adenopathy. No mediastinal or definite hilar adenopathy, given limitations of unenhanced CT. Lungs/Pleura: No pleural fluid.  Clear lungs. Upper Abdomen: Normal imaged portions of the liver, spleen, stomach, pancreas, adrenal glands, kidneys. Musculoskeletal: No acute osseous abnormality. IMPRESSION: 1.  No acute process in the chest. 2. No evidence of sarcoidosis. 3. Aberrant right subclavian artery. Electronically Signed   By: Abigail Miyamoto M.D.   On: 10/22/2017 14:50     Assessment &  Plan:   Laryngopharyngeal reflux (LPR) ?LPR with globus sensation, throat clearing , intermittent gerd  Will try on GERD tx , Rhinitis prevention   Plan  Patient Instructions  Begin Prilosec 72m daily before meal  Begin Pepcid 282mAt bedtime   GERD diet .  Begin Zyrtec 1028maily  Delsym 2 tsp Twice daily As needed  Cough .  Begin Chlortrimeton 4mg7m bedtime Continue on Saline nasal rises . Continue Flonase 2 puffs daily.  Voice rest , no MINTS , no throat clearing .  Follow up with Dr. SoodHalford Chessman 6 weeks and As needed        Lung nodule 3 mm lung nodule in RLL mentioned on CT ABD . Dedicated CT chest w/ no mention of nodule noted .       TammRexene Edison 10/30/2017

## 2017-10-30 NOTE — Progress Notes (Signed)
PFT done today. 

## 2017-10-30 NOTE — Assessment & Plan Note (Signed)
3 mm lung nodule in RLL mentioned on CT ABD . Dedicated CT chest w/ no mention of nodule noted .

## 2017-10-30 NOTE — Assessment & Plan Note (Signed)
?  LPR with globus sensation, throat clearing , intermittent gerd  Will try on GERD tx , Rhinitis prevention   Plan  Patient Instructions  Begin Prilosec  daily before meal  Begin Pepcid  At bedtime   GERD diet .  Begin Zyrtec  daily  Delsym 2 tsp Twice daily As needed  Cough .  Begin Chlortrimeton  At bedtime Continue on Saline nasal rises . Continue Flonase 2 puffs daily.  Voice rest , no MINTS , no throat clearing .  Follow up with Dr. Craige Cotta  In 6 weeks and As needed

## 2017-10-30 NOTE — Patient Instructions (Addendum)
Begin Prilosec  daily before meal  Begin Pepcid  At bedtime   GERD diet .  Begin Zyrtec  daily  Delsym 2 tsp Twice daily As needed  Cough .  Begin Chlortrimeton  At bedtime Continue on Saline nasal rises . Continue Flonase 2 puffs daily.  Voice rest , no MINTS , no throat clearing .  Follow up with Dr. Craige Cotta  In 6 weeks and As needed

## 2017-11-05 NOTE — Progress Notes (Signed)
Reviewed and agree with assessment/plan.   Demoni Gergen, MD [facility] 06/06/2016, 12:24 PM Pager:  [phone]  

## 2017-11-18 DIAGNOSIS — F902 Attention-deficit hyperactivity disorder, combined type: Secondary | ICD-10-CM | POA: Diagnosis not present

## 2017-11-18 DIAGNOSIS — F411 Generalized anxiety disorder: Secondary | ICD-10-CM | POA: Diagnosis not present

## 2017-12-16 ENCOUNTER — Ambulatory Visit: Payer: Medicare Other | Admitting: Pulmonary Disease

## 2018-01-08 DIAGNOSIS — K5900 Constipation, unspecified: Secondary | ICD-10-CM | POA: Diagnosis not present

## 2018-01-08 DIAGNOSIS — K649 Unspecified hemorrhoids: Secondary | ICD-10-CM | POA: Diagnosis not present

## 2018-01-08 DIAGNOSIS — K921 Melena: Secondary | ICD-10-CM | POA: Diagnosis not present

## 2018-01-31 DIAGNOSIS — K649 Unspecified hemorrhoids: Secondary | ICD-10-CM | POA: Diagnosis not present

## 2018-01-31 DIAGNOSIS — K5900 Constipation, unspecified: Secondary | ICD-10-CM | POA: Diagnosis not present

## 2018-01-31 DIAGNOSIS — R198 Other specified symptoms and signs involving the digestive system and abdomen: Secondary | ICD-10-CM | POA: Diagnosis not present

## 2018-01-31 DIAGNOSIS — K921 Melena: Secondary | ICD-10-CM | POA: Diagnosis not present

## 2018-03-03 DIAGNOSIS — R300 Dysuria: Secondary | ICD-10-CM | POA: Diagnosis not present

## 2018-03-03 DIAGNOSIS — K625 Hemorrhage of anus and rectum: Secondary | ICD-10-CM | POA: Diagnosis not present

## 2018-03-03 DIAGNOSIS — K648 Other hemorrhoids: Secondary | ICD-10-CM | POA: Diagnosis not present

## 2018-03-03 DIAGNOSIS — R1030 Lower abdominal pain, unspecified: Secondary | ICD-10-CM | POA: Diagnosis not present

## 2018-03-20 DIAGNOSIS — K921 Melena: Secondary | ICD-10-CM | POA: Diagnosis not present

## 2018-03-20 DIAGNOSIS — K5730 Diverticulosis of large intestine without perforation or abscess without bleeding: Secondary | ICD-10-CM | POA: Diagnosis not present

## 2018-04-10 DIAGNOSIS — F419 Anxiety disorder, unspecified: Secondary | ICD-10-CM | POA: Diagnosis not present

## 2018-04-10 DIAGNOSIS — Z681 Body mass index (BMI) 19 or less, adult: Secondary | ICD-10-CM | POA: Diagnosis not present

## 2018-04-10 DIAGNOSIS — F331 Major depressive disorder, recurrent, moderate: Secondary | ICD-10-CM | POA: Diagnosis not present

## 2018-04-10 DIAGNOSIS — G8929 Other chronic pain: Secondary | ICD-10-CM | POA: Diagnosis not present

## 2018-04-10 DIAGNOSIS — K648 Other hemorrhoids: Secondary | ICD-10-CM | POA: Diagnosis not present

## 2018-04-14 DIAGNOSIS — R09A2 Foreign body sensation, throat: Secondary | ICD-10-CM | POA: Insufficient documentation

## 2018-04-14 DIAGNOSIS — Z7289 Other problems related to lifestyle: Secondary | ICD-10-CM | POA: Diagnosis not present

## 2018-04-14 DIAGNOSIS — R198 Other specified symptoms and signs involving the digestive system and abdomen: Secondary | ICD-10-CM | POA: Insufficient documentation

## 2018-04-14 DIAGNOSIS — R1310 Dysphagia, unspecified: Secondary | ICD-10-CM | POA: Diagnosis not present

## 2018-04-14 DIAGNOSIS — R0989 Other specified symptoms and signs involving the circulatory and respiratory systems: Secondary | ICD-10-CM | POA: Insufficient documentation

## 2018-04-25 DIAGNOSIS — R82998 Other abnormal findings in urine: Secondary | ICD-10-CM | POA: Diagnosis not present

## 2018-04-25 DIAGNOSIS — E7800 Pure hypercholesterolemia, unspecified: Secondary | ICD-10-CM | POA: Diagnosis not present

## 2018-05-01 DIAGNOSIS — G4709 Other insomnia: Secondary | ICD-10-CM | POA: Diagnosis not present

## 2018-05-01 DIAGNOSIS — K219 Gastro-esophageal reflux disease without esophagitis: Secondary | ICD-10-CM | POA: Diagnosis not present

## 2018-05-01 DIAGNOSIS — M2550 Pain in unspecified joint: Secondary | ICD-10-CM | POA: Diagnosis not present

## 2018-05-01 DIAGNOSIS — E7800 Pure hypercholesterolemia, unspecified: Secondary | ICD-10-CM | POA: Diagnosis not present

## 2018-05-01 DIAGNOSIS — Z681 Body mass index (BMI) 19 or less, adult: Secondary | ICD-10-CM | POA: Diagnosis not present

## 2018-05-01 DIAGNOSIS — R1313 Dysphagia, pharyngeal phase: Secondary | ICD-10-CM | POA: Diagnosis not present

## 2018-05-01 DIAGNOSIS — Z0000 Encounter for general adult medical examination without abnormal findings: Secondary | ICD-10-CM | POA: Diagnosis not present

## 2018-05-01 DIAGNOSIS — K5909 Other constipation: Secondary | ICD-10-CM | POA: Diagnosis not present

## 2018-05-01 DIAGNOSIS — D869 Sarcoidosis, unspecified: Secondary | ICD-10-CM | POA: Diagnosis not present

## 2018-05-01 DIAGNOSIS — Z1212 Encounter for screening for malignant neoplasm of rectum: Secondary | ICD-10-CM | POA: Diagnosis not present

## 2018-05-01 DIAGNOSIS — F418 Other specified anxiety disorders: Secondary | ICD-10-CM | POA: Diagnosis not present

## 2018-05-01 DIAGNOSIS — R05 Cough: Secondary | ICD-10-CM | POA: Diagnosis not present

## 2018-05-02 ENCOUNTER — Other Ambulatory Visit: Payer: Self-pay | Admitting: Gastroenterology

## 2018-05-02 DIAGNOSIS — K649 Unspecified hemorrhoids: Secondary | ICD-10-CM | POA: Diagnosis not present

## 2018-05-02 DIAGNOSIS — R1311 Dysphagia, oral phase: Secondary | ICD-10-CM | POA: Diagnosis not present

## 2018-05-05 ENCOUNTER — Ambulatory Visit (HOSPITAL_COMMUNITY)
Admission: RE | Admit: 2018-05-05 | Discharge: 2018-05-05 | Disposition: A | Discharge status: Home / Self Care | Payer: BLUE CROSS/BLUE SHIELD | Source: Ambulatory Visit | Attending: Gastroenterology | Admitting: Gastroenterology

## 2018-05-05 ENCOUNTER — Encounter (HOSPITAL_COMMUNITY)
Admission: RE | Disposition: A | Discharge status: Home / Self Care | Payer: Self-pay | Source: Ambulatory Visit | Attending: Gastroenterology

## 2018-05-05 DIAGNOSIS — R1310 Dysphagia, unspecified: Secondary | ICD-10-CM | POA: Diagnosis not present

## 2018-05-05 HISTORY — PX: ESOPHAGEAL MANOMETRY: SHX5429

## 2018-05-05 SURGERY — MANOMETRY, ESOPHAGUS

## 2018-05-05 MED ORDER — LIDOCAINE VISCOUS HCL 2 % MT SOLN
OROMUCOSAL | Status: AC
  Filled 2018-05-05: qty 15

## 2018-05-05 SURGICAL SUPPLY — 2 items
FACESHIELD LNG OPTICON STERILE (SAFETY) IMPLANT
GLOVE BIO SURGEON STRL SZ8 (GLOVE) ×6 IMPLANT

## 2018-05-05 NOTE — Progress Notes (Signed)
Esophageal manometry performed per protocol.  Patient tolerated procedure without complications.  Dr. Schooler to interpret results. 

## 2018-05-07 ENCOUNTER — Encounter (HOSPITAL_COMMUNITY): Payer: Self-pay | Admitting: Gastroenterology

## 2018-05-07 DIAGNOSIS — R309 Painful micturition, unspecified: Secondary | ICD-10-CM | POA: Diagnosis not present

## 2018-05-13 DIAGNOSIS — R1311 Dysphagia, oral phase: Secondary | ICD-10-CM | POA: Diagnosis not present

## 2018-05-20 DIAGNOSIS — K641 Second degree hemorrhoids: Secondary | ICD-10-CM | POA: Diagnosis not present

## 2018-06-11 DIAGNOSIS — R252 Cramp and spasm: Secondary | ICD-10-CM

## 2018-06-11 HISTORY — DX: Cramp and spasm: R25.2

## 2018-06-24 DIAGNOSIS — K641 Second degree hemorrhoids: Secondary | ICD-10-CM | POA: Diagnosis not present

## 2018-07-08 DIAGNOSIS — F411 Generalized anxiety disorder: Secondary | ICD-10-CM | POA: Diagnosis not present

## 2018-07-08 DIAGNOSIS — F902 Attention-deficit hyperactivity disorder, combined type: Secondary | ICD-10-CM | POA: Diagnosis not present

## 2018-07-08 DIAGNOSIS — Z79899 Other long term (current) drug therapy: Secondary | ICD-10-CM | POA: Diagnosis not present

## 2018-08-06 DIAGNOSIS — H524 Presbyopia: Secondary | ICD-10-CM | POA: Diagnosis not present

## 2018-08-06 DIAGNOSIS — H43391 Other vitreous opacities, right eye: Secondary | ICD-10-CM | POA: Diagnosis not present

## 2018-08-06 DIAGNOSIS — H5213 Myopia, bilateral: Secondary | ICD-10-CM | POA: Diagnosis not present

## 2018-08-06 DIAGNOSIS — H52223 Regular astigmatism, bilateral: Secondary | ICD-10-CM | POA: Diagnosis not present

## 2018-08-06 DIAGNOSIS — R51 Headache: Secondary | ICD-10-CM | POA: Diagnosis not present

## 2018-08-07 DIAGNOSIS — R32 Unspecified urinary incontinence: Secondary | ICD-10-CM | POA: Diagnosis not present

## 2018-08-07 DIAGNOSIS — Z01419 Encounter for gynecological examination (general) (routine) without abnormal findings: Secondary | ICD-10-CM | POA: Diagnosis not present

## 2018-08-07 DIAGNOSIS — Z1231 Encounter for screening mammogram for malignant neoplasm of breast: Secondary | ICD-10-CM | POA: Diagnosis not present

## 2018-08-07 DIAGNOSIS — R339 Retention of urine, unspecified: Secondary | ICD-10-CM | POA: Diagnosis not present

## 2018-08-07 DIAGNOSIS — Z124 Encounter for screening for malignant neoplasm of cervix: Secondary | ICD-10-CM | POA: Diagnosis not present

## 2018-10-23 DIAGNOSIS — R05 Cough: Secondary | ICD-10-CM | POA: Diagnosis not present

## 2018-10-23 DIAGNOSIS — R1313 Dysphagia, pharyngeal phase: Secondary | ICD-10-CM | POA: Diagnosis not present

## 2018-10-23 DIAGNOSIS — R509 Fever, unspecified: Secondary | ICD-10-CM | POA: Diagnosis not present

## 2018-10-23 DIAGNOSIS — D869 Sarcoidosis, unspecified: Secondary | ICD-10-CM | POA: Diagnosis not present

## 2018-10-23 DIAGNOSIS — Z9189 Other specified personal risk factors, not elsewhere classified: Secondary | ICD-10-CM | POA: Diagnosis not present

## 2018-10-23 DIAGNOSIS — Z20818 Contact with and (suspected) exposure to other bacterial communicable diseases: Secondary | ICD-10-CM | POA: Diagnosis not present

## 2018-12-28 IMAGING — CT CT CHEST W/O CM
2 of 4 series · 15 of 36 positions shown, 18 images · non-contrast
Comparison: Plain film 09/08/2014.  Chest CT 06/18/2008.

CLINICAL DATA: Sarcoidosis.  Shortness of breath.

EXAM:
CT CHEST WITHOUT CONTRAST
TECHNIQUE: Multidetector CT imaging of the chest was performed following the
standard protocol without IV contrast.

[Series 2: thorax · axial · 0.64mm/px · z∈[+1350,+1616]mm · 12 of 157 slices shown, 15 images]
[im 12/157  mediastinal]
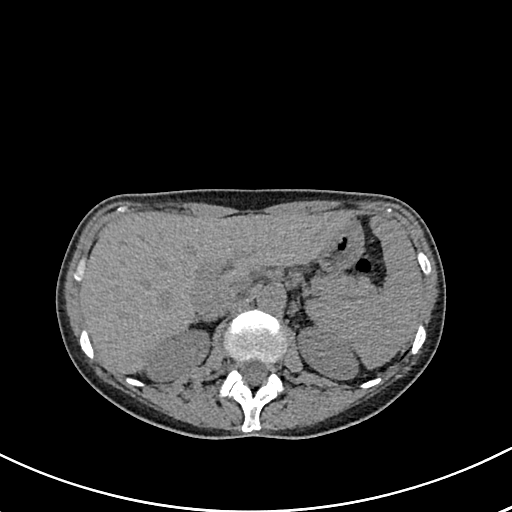
[im 12/157  lung]
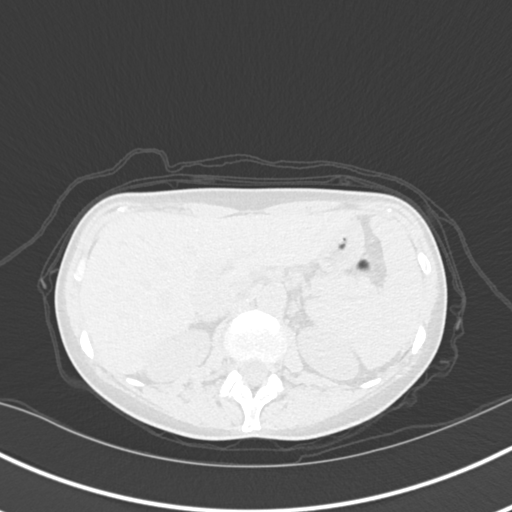
[im 23/157  lung]
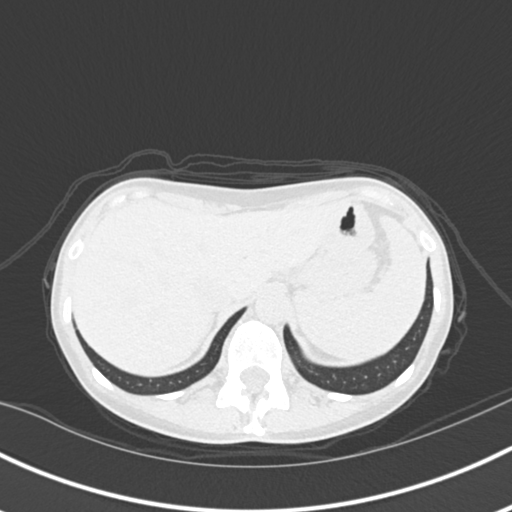
[im 34/157  lung]
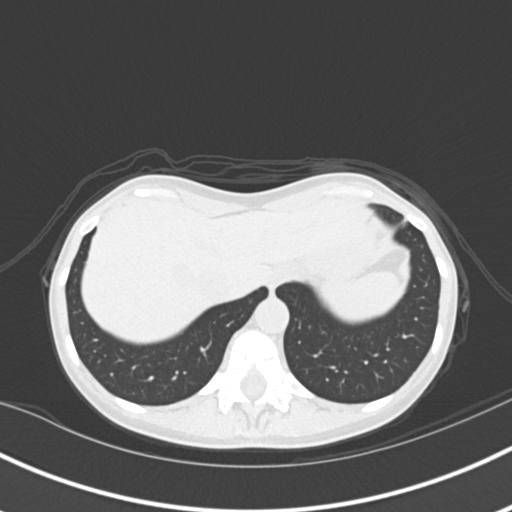
[im 45/157  lung]
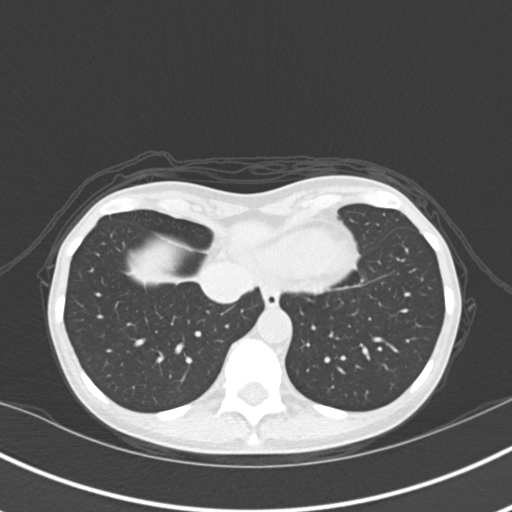
[im 56/157  mediastinal]
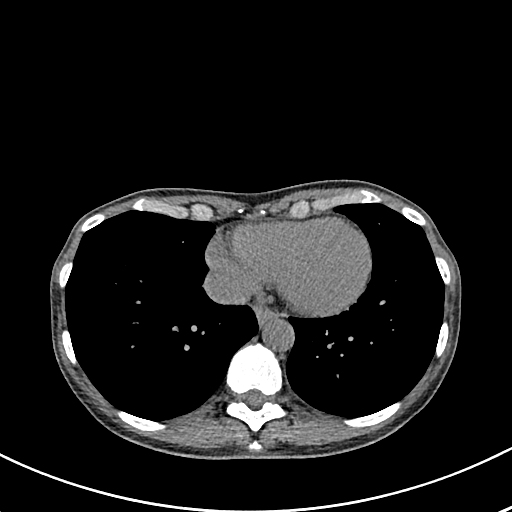
[im 56/157  lung]
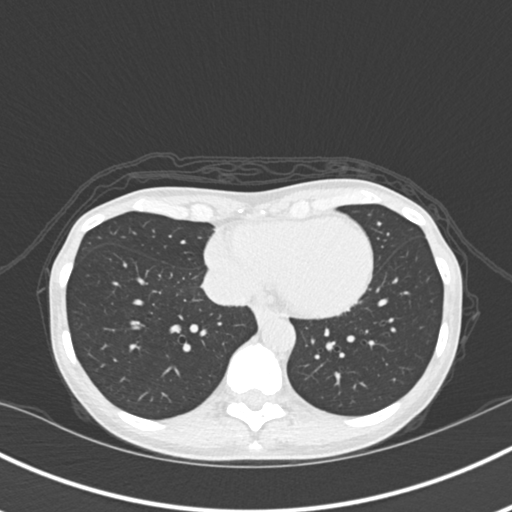
[im 67/157  lung]
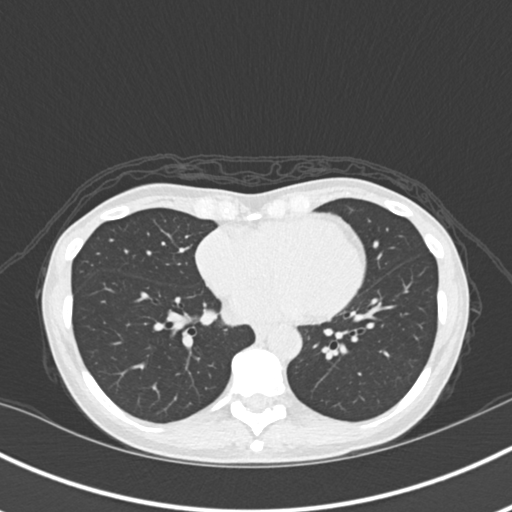
[im 90/157  lung]
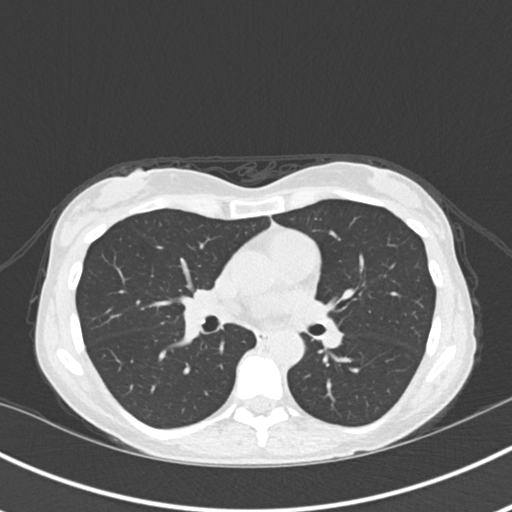
[im 101/157  lung]
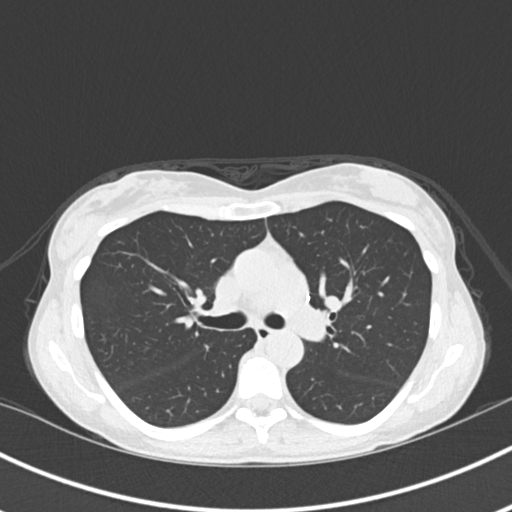
[im 112/157  mediastinal]
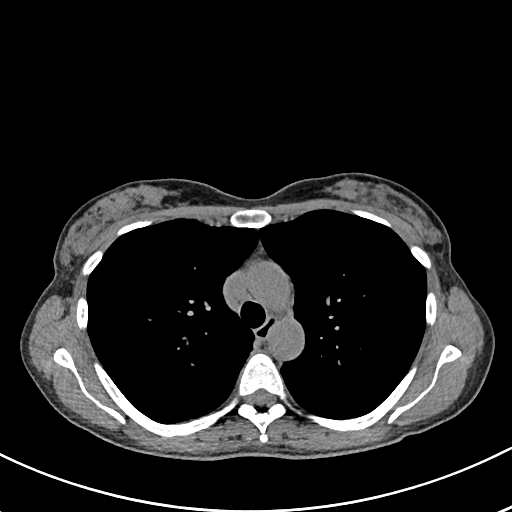
[im 112/157  lung]
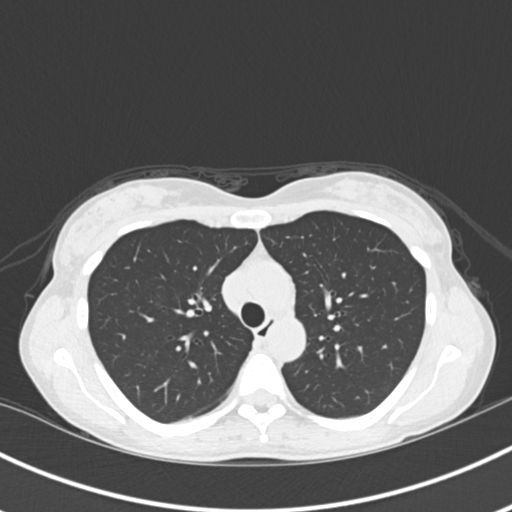
[im 123/157  lung]
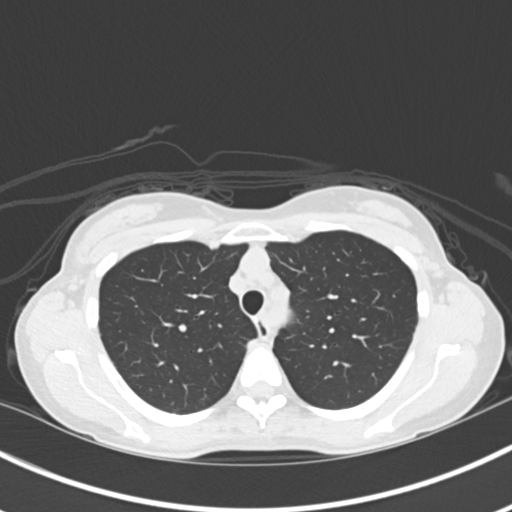
[im 134/157  lung]
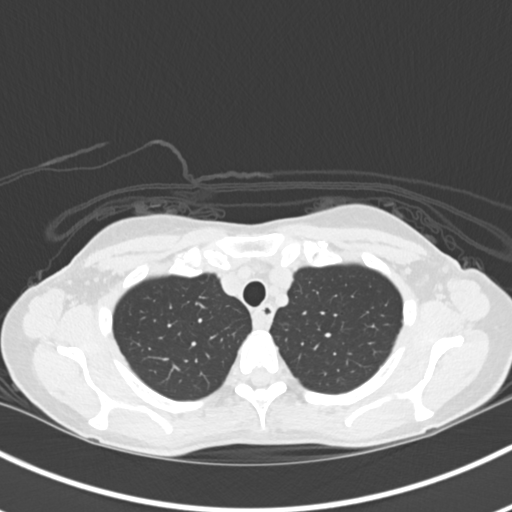
[im 145/157  lung]
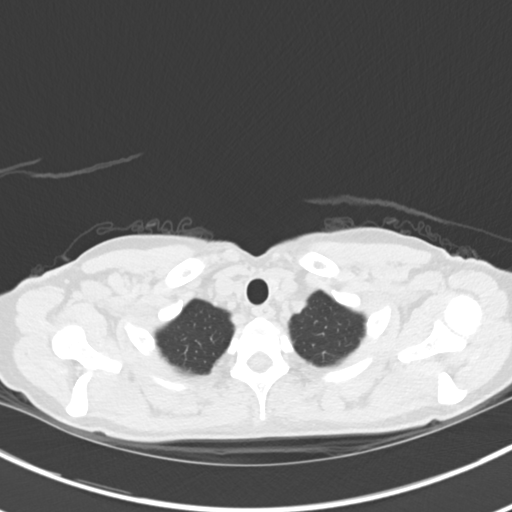

[Series 5: coronal · coronal · 0.66mm/px · 3 of 90 slices shown]
[im 18/90  lung]
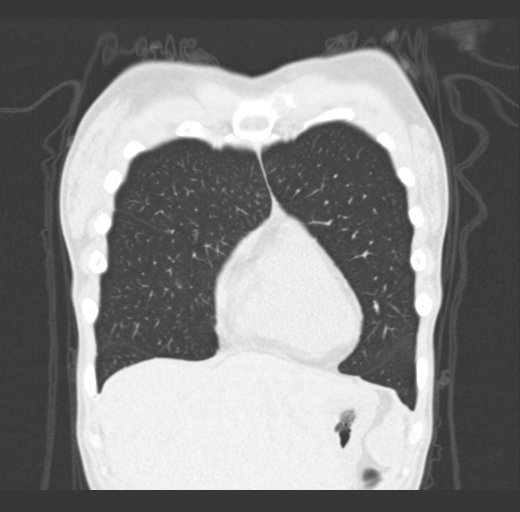
[im 36/90  lung]
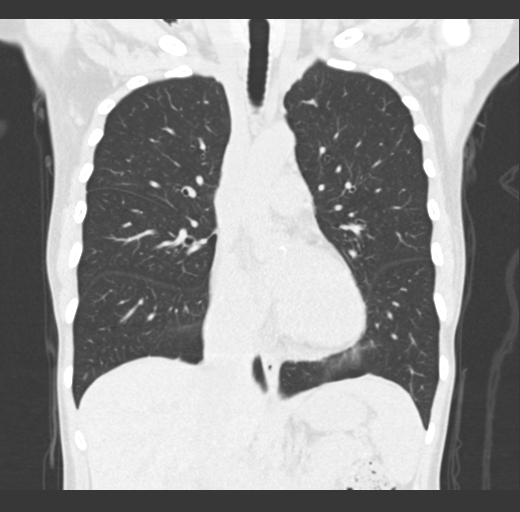
[im 54/90  lung]
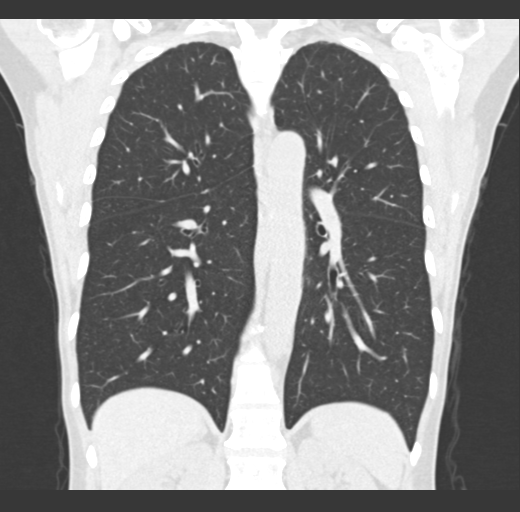

[15 of 36 positions shown; findings below may reference images not displayed]

FINDINGS: Cardiovascular: Normal aortic caliber. Aberrant right subclavian
artery, traversing posterior to the esophagus. Normal heart size,
without pericardial effusion.

Mediastinum/Nodes: No supraclavicular adenopathy. No mediastinal or
definite hilar adenopathy, given limitations of unenhanced CT.

Lungs/Pleura: No pleural fluid.  Clear lungs.

Upper Abdomen: Normal imaged portions of the liver, spleen, stomach,
pancreas, adrenal glands, kidneys.

Musculoskeletal: No acute osseous abnormality.
IMPRESSION: 1.  No acute process in the chest.
2. No evidence of sarcoidosis.
3. Aberrant right subclavian artery.

## 2018-12-29 DIAGNOSIS — M542 Cervicalgia: Secondary | ICD-10-CM | POA: Diagnosis not present

## 2018-12-29 DIAGNOSIS — M9902 Segmental and somatic dysfunction of thoracic region: Secondary | ICD-10-CM | POA: Diagnosis not present

## 2018-12-29 DIAGNOSIS — M50322 Other cervical disc degeneration at C5-C6 level: Secondary | ICD-10-CM | POA: Diagnosis not present

## 2018-12-29 DIAGNOSIS — M9901 Segmental and somatic dysfunction of cervical region: Secondary | ICD-10-CM | POA: Diagnosis not present

## 2018-12-29 DIAGNOSIS — M461 Sacroiliitis, not elsewhere classified: Secondary | ICD-10-CM | POA: Diagnosis not present

## 2018-12-30 DIAGNOSIS — M2560 Stiffness of unspecified joint, not elsewhere classified: Secondary | ICD-10-CM | POA: Diagnosis not present

## 2018-12-30 DIAGNOSIS — M799 Soft tissue disorder, unspecified: Secondary | ICD-10-CM | POA: Diagnosis not present

## 2018-12-30 DIAGNOSIS — M542 Cervicalgia: Secondary | ICD-10-CM | POA: Diagnosis not present

## 2018-12-30 DIAGNOSIS — M5412 Radiculopathy, cervical region: Secondary | ICD-10-CM | POA: Diagnosis not present

## 2018-12-31 DIAGNOSIS — M9902 Segmental and somatic dysfunction of thoracic region: Secondary | ICD-10-CM | POA: Diagnosis not present

## 2018-12-31 DIAGNOSIS — M461 Sacroiliitis, not elsewhere classified: Secondary | ICD-10-CM | POA: Diagnosis not present

## 2018-12-31 DIAGNOSIS — M50322 Other cervical disc degeneration at C5-C6 level: Secondary | ICD-10-CM | POA: Diagnosis not present

## 2018-12-31 DIAGNOSIS — M9901 Segmental and somatic dysfunction of cervical region: Secondary | ICD-10-CM | POA: Diagnosis not present

## 2019-01-02 DIAGNOSIS — M799 Soft tissue disorder, unspecified: Secondary | ICD-10-CM | POA: Diagnosis not present

## 2019-01-02 DIAGNOSIS — M2560 Stiffness of unspecified joint, not elsewhere classified: Secondary | ICD-10-CM | POA: Diagnosis not present

## 2019-01-02 DIAGNOSIS — M5412 Radiculopathy, cervical region: Secondary | ICD-10-CM | POA: Diagnosis not present

## 2019-01-02 DIAGNOSIS — M542 Cervicalgia: Secondary | ICD-10-CM | POA: Diagnosis not present

## 2019-01-05 DIAGNOSIS — S161XXD Strain of muscle, fascia and tendon at neck level, subsequent encounter: Secondary | ICD-10-CM | POA: Diagnosis not present

## 2019-01-05 DIAGNOSIS — M5412 Radiculopathy, cervical region: Secondary | ICD-10-CM | POA: Diagnosis not present

## 2019-01-05 DIAGNOSIS — M799 Soft tissue disorder, unspecified: Secondary | ICD-10-CM | POA: Diagnosis not present

## 2019-01-05 DIAGNOSIS — M2560 Stiffness of unspecified joint, not elsewhere classified: Secondary | ICD-10-CM | POA: Diagnosis not present

## 2019-01-05 DIAGNOSIS — M542 Cervicalgia: Secondary | ICD-10-CM | POA: Diagnosis not present

## 2019-01-06 DIAGNOSIS — F411 Generalized anxiety disorder: Secondary | ICD-10-CM | POA: Diagnosis not present

## 2019-01-06 DIAGNOSIS — F902 Attention-deficit hyperactivity disorder, combined type: Secondary | ICD-10-CM | POA: Diagnosis not present

## 2019-01-07 DIAGNOSIS — M2560 Stiffness of unspecified joint, not elsewhere classified: Secondary | ICD-10-CM | POA: Diagnosis not present

## 2019-01-07 DIAGNOSIS — M5412 Radiculopathy, cervical region: Secondary | ICD-10-CM | POA: Diagnosis not present

## 2019-01-07 DIAGNOSIS — M542 Cervicalgia: Secondary | ICD-10-CM | POA: Diagnosis not present

## 2019-01-07 DIAGNOSIS — M799 Soft tissue disorder, unspecified: Secondary | ICD-10-CM | POA: Diagnosis not present

## 2019-01-07 DIAGNOSIS — S161XXD Strain of muscle, fascia and tendon at neck level, subsequent encounter: Secondary | ICD-10-CM | POA: Diagnosis not present

## 2019-01-26 DIAGNOSIS — M5412 Radiculopathy, cervical region: Secondary | ICD-10-CM | POA: Diagnosis not present

## 2019-01-26 DIAGNOSIS — M542 Cervicalgia: Secondary | ICD-10-CM | POA: Diagnosis not present

## 2019-01-26 DIAGNOSIS — M2560 Stiffness of unspecified joint, not elsewhere classified: Secondary | ICD-10-CM | POA: Diagnosis not present

## 2019-01-26 DIAGNOSIS — M799 Soft tissue disorder, unspecified: Secondary | ICD-10-CM | POA: Diagnosis not present

## 2019-01-26 DIAGNOSIS — S161XXD Strain of muscle, fascia and tendon at neck level, subsequent encounter: Secondary | ICD-10-CM | POA: Diagnosis not present

## 2019-01-28 DIAGNOSIS — M2560 Stiffness of unspecified joint, not elsewhere classified: Secondary | ICD-10-CM | POA: Diagnosis not present

## 2019-01-28 DIAGNOSIS — M799 Soft tissue disorder, unspecified: Secondary | ICD-10-CM | POA: Diagnosis not present

## 2019-01-28 DIAGNOSIS — S161XXD Strain of muscle, fascia and tendon at neck level, subsequent encounter: Secondary | ICD-10-CM | POA: Diagnosis not present

## 2019-01-28 DIAGNOSIS — M542 Cervicalgia: Secondary | ICD-10-CM | POA: Diagnosis not present

## 2019-01-28 DIAGNOSIS — M5412 Radiculopathy, cervical region: Secondary | ICD-10-CM | POA: Diagnosis not present

## 2019-01-30 ENCOUNTER — Ambulatory Visit
Admission: RE | Admit: 2019-01-30 | Discharge: 2019-01-30 | Disposition: A | Discharge status: Home / Self Care | Payer: Medicare Other | Source: Ambulatory Visit | Attending: Obstetrics and Gynecology | Admitting: Obstetrics and Gynecology

## 2019-01-30 ENCOUNTER — Other Ambulatory Visit: Payer: Self-pay | Admitting: Obstetrics and Gynecology

## 2019-01-30 ENCOUNTER — Other Ambulatory Visit: Payer: Self-pay

## 2019-01-30 DIAGNOSIS — Z780 Asymptomatic menopausal state: Secondary | ICD-10-CM | POA: Diagnosis not present

## 2019-01-30 DIAGNOSIS — M8589 Other specified disorders of bone density and structure, multiple sites: Secondary | ICD-10-CM | POA: Diagnosis not present

## 2019-01-30 DIAGNOSIS — E2839 Other primary ovarian failure: Secondary | ICD-10-CM

## 2019-02-03 DIAGNOSIS — M542 Cervicalgia: Secondary | ICD-10-CM | POA: Diagnosis not present

## 2019-02-03 DIAGNOSIS — M5412 Radiculopathy, cervical region: Secondary | ICD-10-CM | POA: Diagnosis not present

## 2019-02-03 DIAGNOSIS — M799 Soft tissue disorder, unspecified: Secondary | ICD-10-CM | POA: Diagnosis not present

## 2019-02-03 DIAGNOSIS — M2560 Stiffness of unspecified joint, not elsewhere classified: Secondary | ICD-10-CM | POA: Diagnosis not present

## 2019-02-03 DIAGNOSIS — S161XXD Strain of muscle, fascia and tendon at neck level, subsequent encounter: Secondary | ICD-10-CM | POA: Diagnosis not present

## 2019-02-05 DIAGNOSIS — M799 Soft tissue disorder, unspecified: Secondary | ICD-10-CM | POA: Diagnosis not present

## 2019-02-05 DIAGNOSIS — M5412 Radiculopathy, cervical region: Secondary | ICD-10-CM | POA: Diagnosis not present

## 2019-02-05 DIAGNOSIS — M542 Cervicalgia: Secondary | ICD-10-CM | POA: Diagnosis not present

## 2019-02-05 DIAGNOSIS — S161XXD Strain of muscle, fascia and tendon at neck level, subsequent encounter: Secondary | ICD-10-CM | POA: Diagnosis not present

## 2019-02-05 DIAGNOSIS — M2560 Stiffness of unspecified joint, not elsewhere classified: Secondary | ICD-10-CM | POA: Diagnosis not present

## 2019-02-09 DIAGNOSIS — M5412 Radiculopathy, cervical region: Secondary | ICD-10-CM | POA: Diagnosis not present

## 2019-02-09 DIAGNOSIS — M799 Soft tissue disorder, unspecified: Secondary | ICD-10-CM | POA: Diagnosis not present

## 2019-02-09 DIAGNOSIS — M2560 Stiffness of unspecified joint, not elsewhere classified: Secondary | ICD-10-CM | POA: Diagnosis not present

## 2019-02-09 DIAGNOSIS — M542 Cervicalgia: Secondary | ICD-10-CM | POA: Diagnosis not present

## 2019-02-09 DIAGNOSIS — S161XXD Strain of muscle, fascia and tendon at neck level, subsequent encounter: Secondary | ICD-10-CM | POA: Diagnosis not present

## 2019-02-17 DIAGNOSIS — M5412 Radiculopathy, cervical region: Secondary | ICD-10-CM | POA: Diagnosis not present

## 2019-02-17 DIAGNOSIS — S161XXD Strain of muscle, fascia and tendon at neck level, subsequent encounter: Secondary | ICD-10-CM | POA: Diagnosis not present

## 2019-02-17 DIAGNOSIS — M542 Cervicalgia: Secondary | ICD-10-CM | POA: Diagnosis not present

## 2019-02-17 DIAGNOSIS — M2560 Stiffness of unspecified joint, not elsewhere classified: Secondary | ICD-10-CM | POA: Diagnosis not present

## 2019-02-17 DIAGNOSIS — M799 Soft tissue disorder, unspecified: Secondary | ICD-10-CM | POA: Diagnosis not present

## 2019-02-19 DIAGNOSIS — S161XXD Strain of muscle, fascia and tendon at neck level, subsequent encounter: Secondary | ICD-10-CM | POA: Diagnosis not present

## 2019-02-19 DIAGNOSIS — M542 Cervicalgia: Secondary | ICD-10-CM | POA: Diagnosis not present

## 2019-02-19 DIAGNOSIS — M799 Soft tissue disorder, unspecified: Secondary | ICD-10-CM | POA: Diagnosis not present

## 2019-02-19 DIAGNOSIS — M5412 Radiculopathy, cervical region: Secondary | ICD-10-CM | POA: Diagnosis not present

## 2019-02-19 DIAGNOSIS — M2560 Stiffness of unspecified joint, not elsewhere classified: Secondary | ICD-10-CM | POA: Diagnosis not present

## 2019-02-24 DIAGNOSIS — S161XXD Strain of muscle, fascia and tendon at neck level, subsequent encounter: Secondary | ICD-10-CM | POA: Diagnosis not present

## 2019-02-24 DIAGNOSIS — M542 Cervicalgia: Secondary | ICD-10-CM | POA: Diagnosis not present

## 2019-02-24 DIAGNOSIS — M2560 Stiffness of unspecified joint, not elsewhere classified: Secondary | ICD-10-CM | POA: Diagnosis not present

## 2019-02-24 DIAGNOSIS — M5412 Radiculopathy, cervical region: Secondary | ICD-10-CM | POA: Diagnosis not present

## 2019-02-24 DIAGNOSIS — M799 Soft tissue disorder, unspecified: Secondary | ICD-10-CM | POA: Diagnosis not present

## 2019-02-26 DIAGNOSIS — M5412 Radiculopathy, cervical region: Secondary | ICD-10-CM | POA: Diagnosis not present

## 2019-02-26 DIAGNOSIS — M542 Cervicalgia: Secondary | ICD-10-CM | POA: Diagnosis not present

## 2019-02-26 DIAGNOSIS — S161XXD Strain of muscle, fascia and tendon at neck level, subsequent encounter: Secondary | ICD-10-CM | POA: Diagnosis not present

## 2019-02-26 DIAGNOSIS — M2560 Stiffness of unspecified joint, not elsewhere classified: Secondary | ICD-10-CM | POA: Diagnosis not present

## 2019-02-26 DIAGNOSIS — M799 Soft tissue disorder, unspecified: Secondary | ICD-10-CM | POA: Diagnosis not present

## 2019-03-05 DIAGNOSIS — M799 Soft tissue disorder, unspecified: Secondary | ICD-10-CM | POA: Diagnosis not present

## 2019-03-05 DIAGNOSIS — M5412 Radiculopathy, cervical region: Secondary | ICD-10-CM | POA: Diagnosis not present

## 2019-03-05 DIAGNOSIS — S161XXD Strain of muscle, fascia and tendon at neck level, subsequent encounter: Secondary | ICD-10-CM | POA: Diagnosis not present

## 2019-03-05 DIAGNOSIS — M542 Cervicalgia: Secondary | ICD-10-CM | POA: Diagnosis not present

## 2019-03-05 DIAGNOSIS — M2560 Stiffness of unspecified joint, not elsewhere classified: Secondary | ICD-10-CM | POA: Diagnosis not present

## 2019-03-09 DIAGNOSIS — M542 Cervicalgia: Secondary | ICD-10-CM | POA: Diagnosis not present

## 2019-03-09 DIAGNOSIS — M2560 Stiffness of unspecified joint, not elsewhere classified: Secondary | ICD-10-CM | POA: Diagnosis not present

## 2019-03-09 DIAGNOSIS — M5412 Radiculopathy, cervical region: Secondary | ICD-10-CM | POA: Diagnosis not present

## 2019-03-09 DIAGNOSIS — M799 Soft tissue disorder, unspecified: Secondary | ICD-10-CM | POA: Diagnosis not present

## 2019-03-09 DIAGNOSIS — S161XXD Strain of muscle, fascia and tendon at neck level, subsequent encounter: Secondary | ICD-10-CM | POA: Diagnosis not present

## 2019-03-13 DIAGNOSIS — M799 Soft tissue disorder, unspecified: Secondary | ICD-10-CM | POA: Diagnosis not present

## 2019-03-13 DIAGNOSIS — M2560 Stiffness of unspecified joint, not elsewhere classified: Secondary | ICD-10-CM | POA: Diagnosis not present

## 2019-03-13 DIAGNOSIS — M542 Cervicalgia: Secondary | ICD-10-CM | POA: Diagnosis not present

## 2019-03-13 DIAGNOSIS — S161XXD Strain of muscle, fascia and tendon at neck level, subsequent encounter: Secondary | ICD-10-CM | POA: Diagnosis not present

## 2019-03-13 DIAGNOSIS — M5412 Radiculopathy, cervical region: Secondary | ICD-10-CM | POA: Diagnosis not present

## 2019-03-16 DIAGNOSIS — M5412 Radiculopathy, cervical region: Secondary | ICD-10-CM | POA: Diagnosis not present

## 2019-03-16 DIAGNOSIS — M799 Soft tissue disorder, unspecified: Secondary | ICD-10-CM | POA: Diagnosis not present

## 2019-03-16 DIAGNOSIS — S161XXD Strain of muscle, fascia and tendon at neck level, subsequent encounter: Secondary | ICD-10-CM | POA: Diagnosis not present

## 2019-03-16 DIAGNOSIS — M2560 Stiffness of unspecified joint, not elsewhere classified: Secondary | ICD-10-CM | POA: Diagnosis not present

## 2019-03-16 DIAGNOSIS — M542 Cervicalgia: Secondary | ICD-10-CM | POA: Diagnosis not present

## 2019-03-23 DIAGNOSIS — M2560 Stiffness of unspecified joint, not elsewhere classified: Secondary | ICD-10-CM | POA: Diagnosis not present

## 2019-03-23 DIAGNOSIS — M5412 Radiculopathy, cervical region: Secondary | ICD-10-CM | POA: Diagnosis not present

## 2019-03-23 DIAGNOSIS — S161XXD Strain of muscle, fascia and tendon at neck level, subsequent encounter: Secondary | ICD-10-CM | POA: Diagnosis not present

## 2019-03-23 DIAGNOSIS — M542 Cervicalgia: Secondary | ICD-10-CM | POA: Diagnosis not present

## 2019-03-23 DIAGNOSIS — M799 Soft tissue disorder, unspecified: Secondary | ICD-10-CM | POA: Diagnosis not present

## 2019-03-26 DIAGNOSIS — M799 Soft tissue disorder, unspecified: Secondary | ICD-10-CM | POA: Diagnosis not present

## 2019-03-26 DIAGNOSIS — M5412 Radiculopathy, cervical region: Secondary | ICD-10-CM | POA: Diagnosis not present

## 2019-03-26 DIAGNOSIS — S161XXD Strain of muscle, fascia and tendon at neck level, subsequent encounter: Secondary | ICD-10-CM | POA: Diagnosis not present

## 2019-03-26 DIAGNOSIS — M542 Cervicalgia: Secondary | ICD-10-CM | POA: Diagnosis not present

## 2019-03-26 DIAGNOSIS — M2560 Stiffness of unspecified joint, not elsewhere classified: Secondary | ICD-10-CM | POA: Diagnosis not present

## 2019-04-02 DIAGNOSIS — M5412 Radiculopathy, cervical region: Secondary | ICD-10-CM | POA: Diagnosis not present

## 2019-04-02 DIAGNOSIS — S161XXD Strain of muscle, fascia and tendon at neck level, subsequent encounter: Secondary | ICD-10-CM | POA: Diagnosis not present

## 2019-04-02 DIAGNOSIS — M799 Soft tissue disorder, unspecified: Secondary | ICD-10-CM | POA: Diagnosis not present

## 2019-04-02 DIAGNOSIS — M2560 Stiffness of unspecified joint, not elsewhere classified: Secondary | ICD-10-CM | POA: Diagnosis not present

## 2019-04-02 DIAGNOSIS — M542 Cervicalgia: Secondary | ICD-10-CM | POA: Diagnosis not present

## 2019-04-06 DIAGNOSIS — M2560 Stiffness of unspecified joint, not elsewhere classified: Secondary | ICD-10-CM | POA: Diagnosis not present

## 2019-04-06 DIAGNOSIS — M799 Soft tissue disorder, unspecified: Secondary | ICD-10-CM | POA: Diagnosis not present

## 2019-04-06 DIAGNOSIS — M5412 Radiculopathy, cervical region: Secondary | ICD-10-CM | POA: Diagnosis not present

## 2019-04-06 DIAGNOSIS — M542 Cervicalgia: Secondary | ICD-10-CM | POA: Diagnosis not present

## 2019-04-06 DIAGNOSIS — S161XXD Strain of muscle, fascia and tendon at neck level, subsequent encounter: Secondary | ICD-10-CM | POA: Diagnosis not present

## 2019-04-09 DIAGNOSIS — M2560 Stiffness of unspecified joint, not elsewhere classified: Secondary | ICD-10-CM | POA: Diagnosis not present

## 2019-04-09 DIAGNOSIS — M799 Soft tissue disorder, unspecified: Secondary | ICD-10-CM | POA: Diagnosis not present

## 2019-04-09 DIAGNOSIS — S161XXD Strain of muscle, fascia and tendon at neck level, subsequent encounter: Secondary | ICD-10-CM | POA: Diagnosis not present

## 2019-04-09 DIAGNOSIS — M542 Cervicalgia: Secondary | ICD-10-CM | POA: Diagnosis not present

## 2019-04-09 DIAGNOSIS — M5412 Radiculopathy, cervical region: Secondary | ICD-10-CM | POA: Diagnosis not present

## 2019-04-20 DIAGNOSIS — M799 Soft tissue disorder, unspecified: Secondary | ICD-10-CM | POA: Diagnosis not present

## 2019-04-20 DIAGNOSIS — M5412 Radiculopathy, cervical region: Secondary | ICD-10-CM | POA: Diagnosis not present

## 2019-04-20 DIAGNOSIS — M2560 Stiffness of unspecified joint, not elsewhere classified: Secondary | ICD-10-CM | POA: Diagnosis not present

## 2019-04-20 DIAGNOSIS — S161XXD Strain of muscle, fascia and tendon at neck level, subsequent encounter: Secondary | ICD-10-CM | POA: Diagnosis not present

## 2019-04-20 DIAGNOSIS — M542 Cervicalgia: Secondary | ICD-10-CM | POA: Diagnosis not present

## 2019-04-27 DIAGNOSIS — R000 Tachycardia, unspecified: Secondary | ICD-10-CM | POA: Diagnosis not present

## 2019-04-27 DIAGNOSIS — R5383 Other fatigue: Secondary | ICD-10-CM | POA: Diagnosis not present

## 2019-04-27 DIAGNOSIS — R252 Cramp and spasm: Secondary | ICD-10-CM | POA: Diagnosis not present

## 2019-04-27 DIAGNOSIS — G43909 Migraine, unspecified, not intractable, without status migrainosus: Secondary | ICD-10-CM | POA: Diagnosis not present

## 2019-04-27 DIAGNOSIS — R635 Abnormal weight gain: Secondary | ICD-10-CM | POA: Diagnosis not present

## 2019-04-30 ENCOUNTER — Other Ambulatory Visit: Payer: Self-pay

## 2019-04-30 ENCOUNTER — Encounter: Payer: Self-pay | Admitting: Neurology

## 2019-04-30 ENCOUNTER — Ambulatory Visit (INDEPENDENT_AMBULATORY_CARE_PROVIDER_SITE_OTHER): Payer: Medicare Other | Admitting: Neurology

## 2019-04-30 VITALS — BP 93/65 | HR 98 | Temp 97.8°F | Ht 61.0 in | Wt 108.0 lb

## 2019-04-30 DIAGNOSIS — R51 Headache with orthostatic component, not elsewhere classified: Secondary | ICD-10-CM

## 2019-04-30 DIAGNOSIS — J329 Chronic sinusitis, unspecified: Secondary | ICD-10-CM

## 2019-04-30 DIAGNOSIS — H5462 Unqualified visual loss, left eye, normal vision right eye: Secondary | ICD-10-CM | POA: Diagnosis not present

## 2019-04-30 DIAGNOSIS — R519 Headache, unspecified: Secondary | ICD-10-CM

## 2019-04-30 DIAGNOSIS — R292 Abnormal reflex: Secondary | ICD-10-CM

## 2019-04-30 DIAGNOSIS — M542 Cervicalgia: Secondary | ICD-10-CM

## 2019-04-30 DIAGNOSIS — G441 Vascular headache, not elsewhere classified: Secondary | ICD-10-CM

## 2019-04-30 DIAGNOSIS — M7918 Myalgia, other site: Secondary | ICD-10-CM

## 2019-04-30 DIAGNOSIS — G8929 Other chronic pain: Secondary | ICD-10-CM

## 2019-04-30 DIAGNOSIS — G43711 Chronic migraine without aura, intractable, with status migrainosus: Secondary | ICD-10-CM | POA: Insufficient documentation

## 2019-04-30 DIAGNOSIS — R29898 Other symptoms and signs involving the musculoskeletal system: Secondary | ICD-10-CM | POA: Diagnosis not present

## 2019-04-30 DIAGNOSIS — M5412 Radiculopathy, cervical region: Secondary | ICD-10-CM

## 2019-04-30 DIAGNOSIS — R200 Anesthesia of skin: Secondary | ICD-10-CM

## 2019-04-30 MED ORDER — AJOVY 225 MG/1.5ML ~~LOC~~ SOAJ: 225.0000 mg | SUBCUTANEOUS | 11 refills | Status: DC

## 2019-04-30 MED ORDER — AMOXICILLIN-POT CLAVULANATE 875-125 MG PO TABS: 1.0000 | ORAL_TABLET | Freq: Two times a day (BID) | ORAL | 0 refills | Status: DC

## 2019-04-30 NOTE — Progress Notes (Signed)
[facility]    Provider:  Dr Jaynee Eagles Requesting Provider: Tisovec, Fransico Him, MD Primary Care Provider:  Haywood Pao, MD  CC:  Daily Headache  HPI:  [name] [name] is a 53 y.o. female here as requested by Tisovec, Fransico Him, MD for migraines. She has a history of migraines. When she hit menopause they went away. They started coming back in the last year. She has a headache that requires ice every day. She wakes up with then in the middle of the night with the worst headache. She has a lot of congestion. They started daily for over a month and prior several days a week. They are in the forehead, in the front of the head, pounding. She is using daily nasal saline. The congestion has been ongoing for a while as well. The headaches can be worse when she wakes with them. She has light sensitivity, nausea, ice and a dark room helps. She also had recent neck issues she has had dry needling for the cervical muscles. She is going to PT. She sees Noemi Chapel. She has problems taking medications. She has had Complex Regional Pain Syndrome, she has a lot of sensitivities to medications. She has a history of sarcoid. Also "brain fog". Also left vision changes feels she has lost vision in the left eye. Penicillin no allergy more sensitivity in the stomach.   Topiramate, amitriptyline/nortriptyline, botox, trokendi, trazodone, sertraline, tizanidine. Botox.   Reviewed notes, labs and imaging from outside physicians, which showed:   Review of Systems: Patient complains of symptoms per HPI as well as the following symptoms: headache Pertinent negatives and positives per HPI. All others negative.   Social History   Socioeconomic History  . Marital status: Married    Spouse name: Not on file  . Number of children: Not on file  . Years of education: Not on file  . Highest education level: Master's degree (e.g., MA, MS, MEng, MEd, MSW, MBA)  Occupational History  . Not on file   Social Needs  . Financial resource strain: Not on file  . Food insecurity    Worry: Not on file    Inability: Not on file  . Transportation needs    Medical: Not on file    Non-medical: Not on file  Tobacco Use  . Smoking status: Never Smoker  . Smokeless tobacco: Never Used  Substance and Sexual Activity  . Alcohol use: Yes    Alcohol/week: 1.0 standard drinks    Types: 1 Standard drinks or equivalent per week  . Drug use: No  . Sexual activity: Yes    Partners: Male    Birth control/protection: Pill  Lifestyle  . Physical activity    Days per week: Not on file    Minutes per session: Not on file  . Stress: Not on file  Relationships  . Social Herbalist on phone: Not on file    Gets together: Not on file    Attends religious service: Not on file    Active member of club or organization: Not on file    Attends meetings of clubs or organizations: Not on file    Relationship status: Not on file  . Intimate partner violence    Fear of current or ex partner: Not on file    Emotionally abused: Not on file    Physically abused: Not on file    Forced sexual activity: Not on file  Other Topics Concern  . Not  on file  Social History Narrative   Lives at home with husband and daughter   Right handed   Caffeine: 1 cup/day    Family History  Problem Relation Age of Onset  . Diabetes Mother        TYPE 2  . Hypertension Mother   . Cancer Mother        UTERINE CANCER  . Heart disease Father        HEART ATTACK  . Migraines Father     Past Medical History:  Diagnosis Date  . Anxiety   . Brain fog   . Chronic pain    COMPLEX REGIONAL PAIN SYNDROME  . Hemorrhoids   . History of neck problems    dry needling being done & PT  . Leg cramping 2020   right, says sometimes she has a hard time putting pressure on that leg   . Migraine headache     Patient Active Problem List   Diagnosis Date Noted  . Chronic migraine without aura, with intractable  migraine, so stated, with status migrainosus 04/30/2019  . Laryngopharyngeal reflux (LPR) 10/30/2017  . Lung nodule 10/30/2017  . Right foot pain 11/04/2012  . Migraine with aura 02/06/2011  . CONSTIPATION, CHRONIC 07/11/2007  . 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 NAUSEA WITH VOMITING 06/13/2007  . 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 RESTLESS LEG SYNDROME 04/02/2007  . INSOMNIA 04/02/2007    Past Surgical History:  Procedure Laterality Date  . CHOLECYSTECTOMY    . DILATION AND CURETTAGE OF UTERUS    . ESOPHAGEAL MANOMETRY N/A 05/05/2018   Procedure: ESOPHAGEAL MANOMETRY (EM);  Surgeon: Vida Rigger, MD;  Location: [facility];  Service: Endoscopy;  Laterality: N/A;  . SHOULDER ARTHROSCOPY Left 2008  . THORACOSCOPY    . TONSILLECTOMY AND ADENOIDECTOMY     AT AGE 75  . WISDOM TOOTH EXTRACTION      Current Outpatient Medications  Medication Sig Dispense Refill  . ALPRAZolam (XANAX) 0.5 MG tablet Take 0.5 mg by mouth at bedtime as needed for anxiety.    . Amphetamine ER (ADZENYS XR-ODT) 18.8 MG TBED Take 1 tablet by mouth daily.     Marland Kitchen estradiol-norethindrone (ACTIVELLA) 1-0.5 MG tablet Take 1 tablet by mouth daily.    . sertraline (ZOLOFT) 100 MG tablet Take 100 mg by mouth daily.      Marland Kitchen tiZANidine (ZANAFLEX) 4 MG tablet Take 4 mg by mouth as needed.    . traZODone (DESYREL) 50 MG tablet Take 75 mg by mouth at bedtime. Takes 50-100 mg every night at bedtime.    Marland Kitchen amoxicillin-clavulanate (AUGMENTIN) 875-125 MG tablet Take 1 tablet by mouth 2 (two) times daily. 14 tablet 0  . Fremanezumab-vfrm (AJOVY) 225 MG/1.5ML SOAJ Inject 225 mg into the skin every 30 (thirty) days. 1 pen 11   No current facility-administered medications for this visit.     Allergies as of 04/30/2019 - Review Complete 04/30/2019  Allergen Reaction Noted  . Levaquin [levofloxacin in d5w]  08/30/2015  . Metoclopramide hcl    . Penicillins    . Reglan [metoclopramide] Anxiety 08/30/2015     Vitals: BP 93/65 (BP Location: Left Arm, Patient Position: Sitting)   Pulse 98   Temp 97.8 F (36.6 C) Comment: taken at front door  Ht 5\' 1"  (1.549 m)   Wt 108 lb (49 kg)   LMP 01/05/2011   BMI 20.41 kg/m  Last Weight:  Wt Readings from Last 1 Encounters:  04/30/19 108 lb (49 kg)  Last Height:   Ht Readings from Last 1 Encounters:  04/30/19 5\' 1"  (1.549 m)     Physical exam: Exam: Gen: NAD, conversant, well nourised, well groomed                     CV: RRR, no MRG. No Carotid Bruits. No peripheral edema, warm, nontender Eyes: Conjunctivae clear without exudates or hemorrhage  Neuro: Detailed Neurologic Exam  Speech:    Speech is normal; fluent and spontaneous with normal comprehension.  Cognition:    The patient is oriented to person, place, and time;     recent and remote memory intact;     language fluent;     normal attention, concentration,     fund of knowledge Cranial Nerves:    The pupils are equal, round, and reactive to light. The fundi are normal and spontaneous venous pulsations are present. Visual fields are full to finger confrontation. Extraocular movements are intact. Trigeminal sensation is intact and the muscles of mastication are normal. The face is symmetric. The palate elevates in the midline. Hearing intact. Voice is normal. Shoulder shrug is normal. The tongue has normal motion without fasciculations.   Coordination:    No dysmetria  Gait:    Normal native gait  Motor Observation:    No asymmetry, no atrophy, and no involuntary movements noted. Tone:    Normal muscle tone.    Posture:    Posture is normal. normal erect    Strength: right leg proximal weakness. Otherwise strength is V/V in the upper and lower limbs.      Sensation: intact to LT     Reflex Exam:  DTR's:    Deep tendon reflexes in the upper and lower extremities are brisk bilaterally.   Toes:    The toes are downgoing bilaterally.   Clonus:    Clonus is absent.     Assessment/Plan:  53 year old with chronic intractable migraine and chronic neck pain but given concerning symptoms needs further evaluation  MRI brain: MRI brain due to concerning symptoms of morning headaches, nocturnal headaches, positional headaches,vision changes  to look for space occupying mass, chiari or intracranial hypertension (pseudotumor).  MRI cervical spine: Has been under the care of a physician for > 6 months, conservative measures, hand numbness and weaknes needs MRI cervical spine to evaluate for spondylosis and radiculopathy or other.  Cervical myofascial pain syndrome: Lorrene ReidKelly Takacs Brassfield  Sinusitis: Augmentin  Migraine: Ajovy   Orders Placed This Encounter  Procedures  . MR BRAIN W WO CONTRAST  . MR CERVICAL SPINE WO CONTRAST  . Ambulatory referral to Physical Therapy   Meds ordered this encounter  Medications  . amoxicillin-clavulanate (AUGMENTIN) 875-125 MG tablet    Sig: Take 1 tablet by mouth 2 (two) times daily.    Dispense:  14 tablet    Refill:  0  . Fremanezumab-vfrm (AJOVY) 225 MG/1.5ML SOAJ    Sig: Inject 225 mg into the skin every 30 (thirty) days.    Dispense:  1 pen    Refill:  11    Cc: Tisovec, Adelfa Kohichard W, MD,    Naomie DeanAntonia Tiyona Desouza, MD  [facility] [address] [place], [place] [id number]  Phone [phone] Fax [phone]

## 2019-04-30 NOTE — Patient Instructions (Signed)
Amoxicillin; Clavulanic Acid tablets What is this medicine? AMOXICILLIN; CLAVULANIC ACID (a mox i SIL in; KLAV yoo lan ic AS id) is a penicillin antibiotic. It is used to treat certain kinds of bacterial infections. It will not work for colds, flu, or other viral infections. This medicine may be used for other purposes; ask your health care provider or pharmacist if you have questions. COMMON BRAND NAME(S): Augmentin What should I tell my health care provider before I take this medicine? They need to know if you have any of these conditions:  bowel disease, like colitis  kidney disease  liver disease  mononucleosis  an unusual or allergic reaction to amoxicillin, penicillin, cephalosporin, other antibiotics, clavulanic acid, other medicines, foods, dyes, or preservatives  pregnant or trying to get pregnant  breast-feeding How should I use this medicine? Take this medicine by mouth with a full glass of water. Follow the directions on the prescription label. Take at the start of a meal. Do not crush or chew. If the tablet has a score line, you may cut it in half at the score line for easier swallowing. Take your medicine at regular intervals. Do not take your medicine more often than directed. Take all of your medicine as directed even if you think you are better. Do not skip doses or stop your medicine early. Talk to your pediatrician regarding the use of this medicine in children. Special care may be needed. Overdosage: If you think you have taken too much of this medicine contact a poison control center or emergency room at once. NOTE: This medicine is only for you. Do not share this medicine with others. What if I miss a dose? If you miss a dose, take it as soon as you can. If it is almost time for your next dose, take only that dose. Do not take double or extra doses. What may interact with this medicine?  allopurinol  anticoagulants  birth control  pills  methotrexate  probenecid This list may not describe all possible interactions. Give your health care provider a list of all the medicines, herbs, non-prescription drugs, or dietary supplements you use. Also tell them if you smoke, drink alcohol, or use illegal drugs. Some items may interact with your medicine. What should I watch for while using this medicine? Tell your doctor or healthcare provider if your symptoms do not improve. This medicine may cause serious skin reactions. They can happen weeks to months after starting the medicine. Contact your healthcare provider right away if you notice fevers or flu-like symptoms with a rash. The rash may be red or purple and then turn into blisters or peeling of the skin. Or, you might notice a red rash with swelling of the face, lips or lymph nodes in your neck or under your arms. Do not treat diarrhea with over the counter products. Contact your doctor if you have diarrhea that lasts more than 2 days or if it is severe and watery. If you have diabetes, you may get a false-positive result for sugar in your urine. Check with your doctor or healthcare provider. Birth control pills may not work properly while you are taking this medicine. Talk to your doctor about using an extra method of birth control. What side effects may I notice from receiving this medicine? Side effects that you should report to your doctor or health care professional as soon as possible:  allergic reactions like skin rash, itching or hives, swelling of the face, lips, or tongue  breathing problems  dark urine  fever or chills, sore throat  redness, blistering, peeling, or loosening of the skin, including inside the mouth  seizures  trouble passing urine or change in the amount of urine  unusual bleeding, bruising  unusually weak or tired  white patches or sores in the mouth or throat Side effects that usually do not require medical attention (report to your  doctor or health care professional if they continue or are bothersome):  diarrhea  dizziness  headache  nausea, vomiting  stomach upset  vaginal or anal irritation This list may not describe all possible side effects. Call your doctor for medical advice about side effects. You may report side effects to FDA at 1-800-FDA-1088. Where should I keep my medicine? Keep out of the reach of children. Store at room temperature below 25 degrees C (77 degrees F). Keep container tightly closed. Throw away any unused medicine after the expiration date. NOTE: This sheet is a summary. It may not cover all possible information. If you have questions about this medicine, talk to your doctor, pharmacist, or health care provider.  2020 Elsevier/Gold Standard (2018-08-11 09:43:46)  Vernell Barrier injection What is this medicine? FREMANEZUMAB (fre ma NEZ ue mab) is used to prevent migraine headaches. This medicine may be used for other purposes; ask your health care provider or pharmacist if you have questions. COMMON BRAND NAME(S): AJOVY What should I tell my health care provider before I take this medicine? They need to know if you have any of these conditions:  an unusual or allergic reaction to fremanezumab, other medicines, foods, dyes, or preservatives  pregnant or trying to get pregnant  breast-feeding How should I use this medicine? This medicine is for injection under the skin. You will be taught how to prepare and give this medicine. Use exactly as directed. Take your medicine at regular intervals. Do not take your medicine more often than directed. It is important that you put your used needles and syringes in a special sharps container. Do not put them in a trash can. If you do not have a sharps container, call your pharmacist or healthcare provider to get one. Talk to your pediatrician regarding the use of this medicine in children. Special care may be needed. Overdosage: If you think you  have taken too much of this medicine contact a poison control center or emergency room at once. NOTE: This medicine is only for you. Do not share this medicine with others. What if I miss a dose? If you miss a dose, take it as soon as you can. If it is almost time for your next dose, take only that dose. Do not take double or extra doses. What may interact with this medicine? Interactions are not expected. This list may not describe all possible interactions. Give your health care provider a list of all the medicines, herbs, non-prescription drugs, or dietary supplements you use. Also tell them if you smoke, drink alcohol, or use illegal drugs. Some items may interact with your medicine. What should I watch for while using this medicine? Tell your doctor or healthcare professional if your symptoms do not start to get better or if they get worse. What side effects may I notice from receiving this medicine? Side effects that you should report to your doctor or health care professional as soon as possible:  allergic reactions like skin rash, itching or hives, swelling of the face, lips, or tongue Side effects that usually do not require medical attention (report  these to your doctor or health care professional if they continue or are bothersome):  pain, redness, or irritation at site where injected This list may not describe all possible side effects. Call your doctor for medical advice about side effects. You may report side effects to FDA at 1-800-FDA-1088. Where should I keep my medicine? Keep out of the reach of children. You will be instructed on how to store this medicine. Throw away any unused medicine after the expiration date on the label. NOTE: This sheet is a summary. It may not cover all possible information. If you have questions about this medicine, talk to your doctor, pharmacist, or health care provider.  2020 Elsevier/Gold Standard (2017-02-25 17:22:56)

## 2019-05-03 ENCOUNTER — Telehealth: Payer: Self-pay

## 2019-05-03 NOTE — Telephone Encounter (Signed)
Order sent to GI to schedule. Patient can call them at [phone] for appointment status.  

## 2019-05-06 DIAGNOSIS — F411 Generalized anxiety disorder: Secondary | ICD-10-CM | POA: Diagnosis not present

## 2019-05-06 DIAGNOSIS — F902 Attention-deficit hyperactivity disorder, combined type: Secondary | ICD-10-CM | POA: Diagnosis not present

## 2019-05-12 ENCOUNTER — Ambulatory Visit: Payer: BC Managed Care – PPO | Attending: Neurology

## 2019-05-12 ENCOUNTER — Other Ambulatory Visit: Payer: Self-pay

## 2019-05-12 DIAGNOSIS — M542 Cervicalgia: Secondary | ICD-10-CM | POA: Insufficient documentation

## 2019-05-12 DIAGNOSIS — G4489 Other headache syndrome: Secondary | ICD-10-CM | POA: Insufficient documentation

## 2019-05-12 DIAGNOSIS — R252 Cramp and spasm: Secondary | ICD-10-CM | POA: Insufficient documentation

## 2019-05-12 NOTE — Therapy (Signed)
[facility] [address] [place], [place], [address] Phone: [phone]   Fax:  [phone]  Physical Therapy Evaluation  Patient Details  Name: [name] [name] MRN: [id number] Date of Birth: [birth year] Referring Provider (PT): Naomie Dean, MD   Encounter Date: 05/12/2019  PT End of Session - 05/12/19 0934    Visit Number  1    Date for PT Re-Evaluation  07/07/19    Authorization Type  Medicare/BCBS    PT Start Time  0847    PT Stop Time  0925    PT Time Calculation (min)  38 min    Activity Tolerance  Patient tolerated treatment well    Behavior During Therapy  [facility] for tasks assessed/performed       Past Medical History:  Diagnosis Date  . Anxiety   . Brain fog   . Chronic pain    COMPLEX REGIONAL PAIN SYNDROME  . Hemorrhoids   . History of neck problems    dry needling being done & PT  . Leg cramping 2020   right, says sometimes she has a hard time putting pressure on that leg   . Migraine headache     Past Surgical History:  Procedure Laterality Date  . CHOLECYSTECTOMY    . DILATION AND CURETTAGE OF UTERUS    . ESOPHAGEAL MANOMETRY N/A 05/05/2018   Procedure: ESOPHAGEAL MANOMETRY (EM);  Surgeon: Vida Rigger, MD;  Location: [facility];  Service: Endoscopy;  Laterality: N/A;  . SHOULDER ARTHROSCOPY Left 2008  . THORACOSCOPY    . TONSILLECTOMY AND ADENOIDECTOMY     AT AGE 53  . WISDOM TOOTH EXTRACTION      There were no vitals filed for this visit.   Subjective Assessment - 05/12/19 0855    Subjective  Pt presents to PT with neck pain that began in June 2020 when cleaning her hot tub. Pt has had some PT and dry needling with some relief. Pt has history of migraines x 1-2 years.    Pertinent History  migraines    Diagnostic tests  MRI scheduled for neck and head    Currently in Pain?  Yes    Pain Score  6    2/10-9/10 over the past week   Pain Location  Neck    Pain Orientation   Right;Left   Rt>Lt   Pain Descriptors / Indicators  Burning;Tightness    Pain Type  Chronic pain    Pain Onset  More than a month ago    Pain Frequency  Constant    Aggravating Factors   daily activities, movement of arms    Pain Relieving Factors  ice/heat         [facility] PT Assessment - 05/12/19 0001      Assessment   Medical Diagnosis  chronic migraine without aura, cervical myofascial pain syndrome    Referring Provider (PT)  Naomie Dean, MD    Onset Date/Surgical Date  11/10/18    Hand Dominance  Right    Prior Therapy  at another facility      Precautions   Precautions  None      Restrictions   Weight Bearing Restrictions  No      Balance Screen   Has the patient fallen in the past 6 months  No    Has the patient had a decrease in activity level because of a fear of falling?   No    Is the patient reluctant to leave  their home because of a fear of falling?   No      Home Public house manager residence      Prior Function   Level of Independence  Independent    Vocation  On disability      Cognition   Overall Cognitive Status  Within Functional Limits for tasks assessed      Observation/Other Assessments   Focus on Therapeutic Outcomes (FOTO)   53% limitation      Posture/Postural Control   Posture/Postural Control  No significant limitations    Postural Limitations  Decreased thoracic kyphosis      ROM / Strength   AROM / PROM / Strength  AROM;PROM;Strength      AROM   Overall AROM   Deficits    Overall AROM Comments  UE A/ROM is full bilaterally    AROM Assessment Site  Cervical    Cervical Flexion  --   full   Cervical Extension  --   limited by 25% with pain   Cervical - Right Side Bend  35    Cervical - Left Side Bend  40    Cervical - Right Rotation  45    Cervical - Left Rotation  50      PROM   Overall PROM   Within functional limits for tasks performed    Overall PROM Comments  full cervical P/ROM with pain at end  range      Strength   Overall Strength  Deficits    Overall Strength Comments  Rt UE strength is 4/5 due to old injury, 4+/5 Lt Ue strength      Palpation   Spinal mobility  reduced PA mobility C3-T5 with mild pain reported    Palpation comment  trigger points Rt>Lt upper traps, suboccipitals, cervical paraspinals      Transfers   Transfers  Independent with all Transfers      Ambulation/Gait   Gait Pattern  Within Functional Limits                Objective measurements completed on examination: See above findings.                PT Short Term Goals - 05/12/19 0831      PT SHORT TERM GOAL #1   Title  be independent in initial HEP    Time  4    Period  Weeks    Status  New    Target Date  06/09/19      PT SHORT TERM GOAL #2   Title  report < or = to 4-5/10 neck pain with self-care and ADLs    Time  4    Period  Weeks    Status  New    Target Date  06/09/19      PT SHORT TERM GOAL #3   Title  demonstrate bil cervical rotation A/ROM to > or = to 60 degrees to improve safety with driving    Time  4    Period  Weeks    Status  New    Target Date  06/09/19        PT Long Term Goals - 05/12/19 0831      PT LONG TERM GOAL #1   Title  be independent in advanced HEP    Time  8    Period  Weeks    Status  New    Target Date  07/07/19  PT LONG TERM GOAL #2   Title  reduce FOTO to < or = to 44% limitation    Time  8    Period  Weeks    Status  New    Target Date  07/07/19      PT LONG TERM GOAL #3   Title  demonstrate bil cervical A/ROM rotation to > or = to 75 degrees to improve safety with driving    Time  8    Period  Weeks    Status  New    Target Date  07/07/19      PT LONG TERM GOAL #4   Title  report < or =to  3/10 neck pain with ADLs and self-care    Time  8    Period  Weeks    Status  New    Target Date  07/07/19             Plan - 05/12/19 0946    Clinical Impression Statement  Pt presents to PT with chronic  history of migraines and neck pain/tension.  Pt has received dry needling in the past and has had some good results.  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  Pt demonstrates limited cervical A/ROM that is painful at end range, limited cervical and thoracic spinal mobility and trigger points in the thoracic and cervical spine.  Pt reports 6-7/10 average neck pain. Pt will benefit from skilled PT to improve tissue and segmental mobility, postural strength and pain management.    Personal Factors and Comorbidities  Comorbidity 2    Comorbidities  CRPS, migraines    Examination-Activity Limitations  Lift;Dressing    Examination-Participation Restrictions  Community Activity;Shop;Meal Prep    Stability/Clinical Decision Making  Evolving/Moderate complexity    Clinical Decision Making  Moderate    Rehab Potential  Excellent    PT Frequency  2x / week    PT Duration  8 weeks    PT Treatment/Interventions  ADLs/Self Care Home Management;Cryotherapy;Electrical Stimulation;Ultrasound;Traction;Moist Heat;Therapeutic activities;Therapeutic exercise;Patient/family education;Neuromuscular re-education;Manual techniques;Passive range of motion;Dry needling;Spinal Manipulations;Taping;Joint Manipulations    PT Next Visit Plan  dry needling to neck and thoracic spine, postural strength    PT Home Exercise Plan  review HEP issued at previous PT    Consulted and Agree with Plan of Care  Patient       Patient will benefit from skilled therapeutic intervention in order to improve the following deficits and impairments:  Decreased activity tolerance, Decreased strength, Decreased endurance, Decreased range of motion, Increased muscle spasms, Impaired flexibility, Impaired UE functional use, Postural dysfunction, Pain  Visit Diagnosis: Other headache syndrome - Plan: PT plan of care cert/re-cert  Cervicalgia - Plan: PT plan of care cert/re-cert  Cramp  and spasm - Plan: PT plan of care cert/re-cert     Problem List Patient Active Problem List   Diagnosis Date Noted  . Chronic migraine without aura, with intractable migraine, so stated, with status migrainosus 04/30/2019  . Laryngopharyngeal reflux (LPR) 10/30/2017  . Lung nodule 10/30/2017  . Right foot pain 11/04/2012  . Migraine with aura 02/06/2011  . CONSTIPATION, CHRONIC 07/11/2007  . 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 NAUSEA WITH VOMITING 06/13/2007  . 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 RESTLESS LEG SYNDROME 04/02/2007  . INSOMNIA 04/02/2007    Sigurd Sos, PT 05/12/19 11:09 AM  [facility] [address] [place], [place], [address] Phone: [phone]   Fax:  [phone]  Name: Fran LowesBeatrice M Kutzer MRN: [id number] Date of Birth: 02/17/1966

## 2019-05-13 ENCOUNTER — Ambulatory Visit: Payer: BC Managed Care – PPO

## 2019-05-13 ENCOUNTER — Other Ambulatory Visit: Payer: Self-pay

## 2019-05-13 DIAGNOSIS — R252 Cramp and spasm: Secondary | ICD-10-CM | POA: Diagnosis not present

## 2019-05-13 DIAGNOSIS — M542 Cervicalgia: Secondary | ICD-10-CM | POA: Diagnosis not present

## 2019-05-13 DIAGNOSIS — G4489 Other headache syndrome: Secondary | ICD-10-CM | POA: Diagnosis not present

## 2019-05-13 DIAGNOSIS — E7800 Pure hypercholesterolemia, unspecified: Secondary | ICD-10-CM | POA: Diagnosis not present

## 2019-05-13 NOTE — Therapy (Signed)
[facility] [address] [place], [place], [address] Phone: [phone]   Fax:  [phone]  Physical Therapy Treatment  Patient Details  Name: [name] LowesBeatrice M [name] MRN: [id number] Date of Birth: 07/11/1965 Referring Provider (PT): Naomie DeanAhern, Antonia, MD   Encounter Date: 05/13/2019  PT End of Session - 05/13/19 1309    Visit Number  2    Date for PT Re-Evaluation  07/07/19    Authorization Type  Medicare/BCBS    PT Start Time  1233    PT Stop Time  1310    PT Time Calculation (min)  37 min    Activity Tolerance  Patient tolerated treatment well    Behavior During Therapy  [facility] tasks assessed/performed       Past Medical History:  Diagnosis Date  . Anxiety   . Brain fog   . Chronic pain    COMPLEX REGIONAL PAIN SYNDROME  . Hemorrhoids   . History of neck problems    dry needling being done & PT  . Leg cramping 2020   right, says sometimes she has a hard time putting pressure on that leg   . Migraine headache     Past Surgical History:  Procedure Laterality Date  . CHOLECYSTECTOMY    . DILATION AND CURETTAGE OF UTERUS    . ESOPHAGEAL MANOMETRY N/A 05/05/2018   Procedure: ESOPHAGEAL MANOMETRY (EM);  Surgeon: Vida RiggerMagod, Marc, MD;  Location: [facility];  Service: Endoscopy;  Laterality: N/A;  . SHOULDER ARTHROSCOPY Left 2008  . THORACOSCOPY    . TONSILLECTOMY AND ADENOIDECTOMY     AT AGE 53  . WISDOM TOOTH EXTRACTION      There were no vitals filed for this visit.  Subjective Assessment - 05/13/19 1235    Subjective  I have neck pain and a headache today    Currently in Pain?  Yes    Pain Score  5     Pain Location  Neck    Pain Orientation  Right;Left                       [facility] Adult PT Treatment/Exercise - 05/13/19 0001      Manual Therapy   Manual Therapy  Soft tissue mobilization;Myofascial release    Manual therapy comments  suboccipital release and elongation to bil cervical  paraspinals     Soft tissue mobilization  passive ROM into rotation and sidebending       Trigger Point Dry Needling - 05/13/19 0001    Consent Given?  Yes    Education Handout Provided  Yes    Muscles Treated Head and Neck  Upper trapezius;Oblique capitus;Suboccipitals;Cervical multifidi    Other Dry Needling  thoracic multifidi    Upper Trapezius Response  Twitch reponse elicited;Palpable increased muscle length    Oblique Capitus Response  Twitch response elicited;Palpable increased muscle length    Cervical multifidi Response  Twitch reponse elicited;Palpable increased muscle length           PT Education - 05/13/19 1235    Education Details  DN info    Person(s) Educated  Patient    Methods  Explanation;Handout    Comprehension  Verbalized understanding       PT Short Term Goals - 05/12/19 0831      PT SHORT TERM GOAL #1   Title  be independent in initial HEP    Time  4    Period  Weeks  Status  New    Target Date  06/09/19      PT SHORT TERM GOAL #2   Title  report < or = to 4-5/10 neck pain with self-care and ADLs    Time  4    Period  Weeks    Status  New    Target Date  06/09/19      PT SHORT TERM GOAL #3   Title  demonstrate bil cervical rotation A/ROM to > or = to 60 degrees to improve safety with driving    Time  4    Period  Weeks    Status  New    Target Date  06/09/19        PT Long Term Goals - 05/12/19 0831      PT LONG TERM GOAL #1   Title  be independent in advanced HEP    Time  8    Period  Weeks    Status  New    Target Date  07/07/19      PT LONG TERM GOAL #2   Title  reduce FOTO to < or = to 44% limitation    Time  8    Period  Weeks    Status  New    Target Date  07/07/19      PT LONG TERM GOAL #3   Title  demonstrate bil cervical A/ROM rotation to > or = to 75 degrees to improve safety with driving    Time  8    Period  Weeks    Status  New    Target Date  07/07/19      PT LONG TERM GOAL #4   Title  report < or  =to  3/10 neck pain with ADLs and self-care    Time  8    Period  Weeks    Status  New    Target Date  07/07/19            Plan - 05/13/19 1320    Clinical Impression Statement  Pt with first time follow-up after evaluation.  Session focused on dry needling and manual therapy to neck, suboccipitals, upper traps and thoracic multifidi.  Pt has HEP issued at prior therapy and is consistent with these exercises.  Pt had neck pain and a headache upon entering the clinic and pain was relieved by 50% after treatment.  Pt with tension and trigger points in Lt suboccipitals> Rt and upper traps.  Pt will benefit from continued PT to address neck pain and reduced mobility.    PT Frequency  2x / week    PT Duration  8 weeks    PT Treatment/Interventions  ADLs/Self Care Home Management;Cryotherapy;Electrical Stimulation;Ultrasound;Traction;Moist Heat;Therapeutic activities;Therapeutic exercise;Patient/family education;Neuromuscular re-education;Manual techniques;Passive range of motion;Dry needling;Spinal Manipulations;Taping;Joint Manipulations    PT Next Visit Plan  DN to rhomboids and subscapularis, postural strength, assess response to needling to neck.    PT Home Exercise Plan  review HEP issued at previous PT    Consulted and Agree with Plan of Care  Patient       Patient will benefit from skilled therapeutic intervention in order to improve the following deficits and impairments:  Decreased activity tolerance, Decreased strength, Decreased endurance, Decreased range of motion, Increased muscle spasms, Impaired flexibility, Impaired UE functional use, Postural dysfunction, Pain  Visit Diagnosis: Cervicalgia  Other headache syndrome  Cramp and spasm     Problem List Patient Active Problem List   Diagnosis Date Noted  .  Chronic migraine without aura, with intractable migraine, so stated, with status migrainosus 04/30/2019  . Laryngopharyngeal reflux (LPR) 10/30/2017  . Lung nodule  10/30/2017  . Right foot pain 11/04/2012  . Migraine with aura 02/06/2011  . CONSTIPATION, CHRONIC 07/11/2007  . 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 NAUSEA WITH VOMITING 06/13/2007  . 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 RESTLESS LEG SYNDROME 04/02/2007  . INSOMNIA 04/02/2007   [name], PT 05/13/19 1:22 PM  [facility] [address], [address] [place], [place], [address] Phone: [phone]   Fax:  [phone]  Name: [name] [name] MRN: [id number] Date of Birth: [birth year]

## 2019-05-13 NOTE — Patient Instructions (Signed)

## 2019-05-18 ENCOUNTER — Ambulatory Visit: Payer: BC Managed Care – PPO

## 2019-05-18 ENCOUNTER — Other Ambulatory Visit: Payer: Self-pay

## 2019-05-18 DIAGNOSIS — R252 Cramp and spasm: Secondary | ICD-10-CM

## 2019-05-18 DIAGNOSIS — M542 Cervicalgia: Secondary | ICD-10-CM

## 2019-05-18 DIAGNOSIS — G4489 Other headache syndrome: Secondary | ICD-10-CM

## 2019-05-18 NOTE — Therapy (Signed)
[facility] [address], [address] [place], [place], [address] Phone: [phone]   Fax:  [phone]  Physical Therapy Treatment  Patient Details  Name: [name] [name] MRN: [id number] Date of Birth: Feb 19, 1966 Referring Provider (PT): Naomie Dean, MD   Encounter Date: 05/18/2019  PT End of Session - 05/18/19 0846    Visit Number  3    Date for PT Re-Evaluation  07/07/19    Authorization Type  Medicare/BCBS    PT Start Time  0800    PT Stop Time  0843    PT Time Calculation (min)  43 min    Activity Tolerance  Patient tolerated treatment well    Behavior During Therapy  [facility] for tasks assessed/performed       Past Medical History:  Diagnosis Date  . Anxiety   . Brain fog   . Chronic pain    COMPLEX REGIONAL PAIN SYNDROME  . Hemorrhoids   . History of neck problems    dry needling being done & PT  . Leg cramping 2020   right, says sometimes she has a hard time putting pressure on that leg   . Migraine headache     Past Surgical History:  Procedure Laterality Date  . CHOLECYSTECTOMY    . DILATION AND CURETTAGE OF UTERUS    . ESOPHAGEAL MANOMETRY N/A 05/05/2018   Procedure: ESOPHAGEAL MANOMETRY (EM);  Surgeon: Vida Rigger, MD;  Location: [facility];  Service: Endoscopy;  Laterality: N/A;  . SHOULDER ARTHROSCOPY Left 2008  . THORACOSCOPY    . TONSILLECTOMY AND ADENOIDECTOMY     AT AGE 37  . WISDOM TOOTH EXTRACTION      There were no vitals filed for this visit.  Subjective Assessment - 05/18/19 0806    Subjective  I had a headache after last session but it wasn't too bad.  I can't do exercises with weights or bands because it aggravates my arms.    Pertinent History  migraines    Diagnostic tests  MRI scheduled for neck and head-next week    Currently in Pain?  Yes    Pain Orientation  Right;Left    Pain Descriptors / Indicators  Tightness    Pain Onset  More than a month ago    Pain Frequency   Constant    Aggravating Factors   daily activities, movement of arms    Pain Relieving Factors  ice, head, theracane                       [facility] Adult PT Treatment/Exercise - 05/18/19 0001      Exercises   Exercises  Shoulder      Shoulder Exercises: Seated   Other Seated Exercises  3 ways raises: 2x10 with cues for posture      Manual Therapy   Manual Therapy  Soft tissue mobilization;Myofascial release    Manual therapy comments  rhomboids, subscapularis, thoracic spine       Trigger Point Dry Needling - 05/18/19 0001    Consent Given?  Yes    Muscles Treated Head and Neck  Upper trapezius    Muscles Treated Upper Quadrant  Subscapularis;Rhomboids    Other Dry Needling  thoracic multifidi    Rhomboids Response  Palpable increased muscle length;Twitch response elicited    Subscapularis Response  Twitch response elicited;Palpable increased muscle length             PT Short Term Goals - 05/18/19  100848      PT SHORT TERM GOAL #1   Title  be independent in initial HEP    Status  Achieved        PT Long Term Goals - 05/12/19 0831      PT LONG TERM GOAL #1   Title  be independent in advanced HEP    Time  8    Period  Weeks    Status  New    Target Date  07/07/19      PT LONG TERM GOAL #2   Title  reduce FOTO to < or = to 44% limitation    Time  8    Period  Weeks    Status  New    Target Date  07/07/19      PT LONG TERM GOAL #3   Title  demonstrate bil cervical A/ROM rotation to > or = to 75 degrees to improve safety with driving    Time  8    Period  Weeks    Status  New    Target Date  07/07/19      PT LONG TERM GOAL #4   Title  report < or =to  3/10 neck pain with ADLs and self-care    Time  8    Period  Weeks    Status  New    Target Date  07/07/19            Plan - 05/18/19 0820    Clinical Impression Statement  Pt responded well to dry needling last session.  Pt had soreness in Lt suboccipitals with headache after  session and noticed improved mobility.  PT spent session reviewing current HEP.  Pt suggested theraband for upper back strength and pt said she is not able to do this due to upper back strength.  Pt with tension and trigger points in bil thoracic spine, rhomboids and suboccipitals.  Pt demonstrated improved tissue mobility and reduced tension after manual and dry needling today.  Pt will continue to benefit from skilled PT to address chronic pain, postural endurance deficits and improve tissue mobility.    PT Frequency  2x / week    PT Duration  8 weeks    PT Treatment/Interventions  ADLs/Self Care Home Management;Cryotherapy;Electrical Stimulation;Ultrasound;Traction;Moist Heat;Therapeutic activities;Therapeutic exercise;Patient/family education;Neuromuscular re-education;Manual techniques;Passive range of motion;Dry needling;Spinal Manipulations;Taping;Joint Manipulations    PT Next Visit Plan  DN to neck, measure ROM    PT Home Exercise Plan  review HEP issued at previous PT    Consulted and Agree with Plan of Care  Patient       Patient will benefit from skilled therapeutic intervention in order to improve the following deficits and impairments:  Decreased activity tolerance, Decreased strength, Decreased endurance, Decreased range of motion, Increased muscle spasms, Impaired flexibility, Impaired UE functional use, Postural dysfunction, Pain  Visit Diagnosis: Cervicalgia  Other headache syndrome  Cramp and spasm     Problem List Patient Active Problem List   Diagnosis Date Noted  . Chronic migraine without aura, with intractable migraine, so stated, with status migrainosus 04/30/2019  . Laryngopharyngeal reflux (LPR) 10/30/2017  . Lung nodule 10/30/2017  . Right foot pain 11/04/2012  . Migraine with aura 02/06/2011  . CONSTIPATION, CHRONIC 07/11/2007  . 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 NAUSEA WITH VOMITING 06/13/2007  . 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  RESTLESS LEG SYNDROME 04/02/2007  . INSOMNIA 04/02/2007     Lorrene ReidKelly , PT 05/18/19 8:50 AM    [facility] [address] [place], [place], [address] Phone: [phone]   Fax:  [phone]  Name: [name] [name] MRN: [id number] Date of Birth: [birth year]

## 2019-05-20 DIAGNOSIS — F418 Other specified anxiety disorders: Secondary | ICD-10-CM | POA: Diagnosis not present

## 2019-05-20 DIAGNOSIS — D869 Sarcoidosis, unspecified: Secondary | ICD-10-CM | POA: Diagnosis not present

## 2019-05-20 DIAGNOSIS — Z1331 Encounter for screening for depression: Secondary | ICD-10-CM | POA: Diagnosis not present

## 2019-05-20 DIAGNOSIS — K219 Gastro-esophageal reflux disease without esophagitis: Secondary | ICD-10-CM | POA: Diagnosis not present

## 2019-05-20 DIAGNOSIS — Z0000 Encounter for general adult medical examination without abnormal findings: Secondary | ICD-10-CM | POA: Diagnosis not present

## 2019-05-20 DIAGNOSIS — R05 Cough: Secondary | ICD-10-CM | POA: Diagnosis not present

## 2019-05-21 ENCOUNTER — Ambulatory Visit: Payer: BC Managed Care – PPO

## 2019-05-21 ENCOUNTER — Other Ambulatory Visit: Payer: Self-pay

## 2019-05-21 DIAGNOSIS — G4489 Other headache syndrome: Secondary | ICD-10-CM | POA: Diagnosis not present

## 2019-05-21 DIAGNOSIS — M542 Cervicalgia: Secondary | ICD-10-CM

## 2019-05-21 DIAGNOSIS — R252 Cramp and spasm: Secondary | ICD-10-CM | POA: Diagnosis not present

## 2019-05-21 NOTE — Therapy (Signed)
[facility] [address] [place], [place], [address] Phone: [phone]   Fax:  [phone]  Physical Therapy Treatment  Patient Details  Name: [name] [name] MRN: [id number] Date of Birth: May 03, 1966 Referring Provider (PT): Sarina Ill, MD   Encounter Date: 05/21/2019  PT End of Session - 05/21/19 0929    Visit Number  4    Date for PT Re-Evaluation  07/07/19    Authorization Type  Medicare/BCBS    PT Start Time  0853    PT Stop Time  0927    PT Time Calculation (min)  34 min    Activity Tolerance  Patient tolerated treatment well    Behavior During Therapy  [facility] for tasks assessed/performed       Past Medical History:  Diagnosis Date  . Anxiety   . Brain fog   . Chronic pain    COMPLEX REGIONAL PAIN SYNDROME  . Hemorrhoids   . History of neck problems    dry needling being done & PT  . Leg cramping 2020   right, says sometimes she has a hard time putting pressure on that leg   . Migraine headache     Past Surgical History:  Procedure Laterality Date  . CHOLECYSTECTOMY    . DILATION AND CURETTAGE OF UTERUS    . ESOPHAGEAL MANOMETRY N/A 05/05/2018   Procedure: ESOPHAGEAL MANOMETRY (EM);  Surgeon: Clarene Essex, MD;  Location: [facility];  Service: Endoscopy;  Laterality: N/A;  . SHOULDER ARTHROSCOPY Left 2008  . THORACOSCOPY    . TONSILLECTOMY AND ADENOIDECTOMY     AT AGE 53  . WISDOM TOOTH EXTRACTION      There were no vitals filed for this visit.  Subjective Assessment - 05/21/19 0856    Subjective  I was sore after last session.  I am doing my exercises.    Currently in Pain?  Yes    Pain Score  5     Pain Location  Neck    Pain Orientation  Right;Left    Pain Descriptors / Indicators  Tightness    Pain Type  Chronic pain    Pain Onset  More than a month ago    Pain Frequency  Constant                       [facility] Adult PT Treatment/Exercise - 05/21/19 0001      Manual Therapy   Manual Therapy  Soft tissue mobilization;Myofascial release    Manual therapy comments  suboccipital release and elongation to bil cervical paraspinals     Soft tissue mobilization  passive ROM into rotation and sidebending       Trigger Point Dry Needling - 05/21/19 0001    Consent Given?  Yes    Muscles Treated Head and Neck  Upper trapezius;Oblique capitus;Suboccipitals;Cervical multifidi    Upper Trapezius Response  Twitch reponse elicited;Palpable increased muscle length    Oblique Capitus Response  Twitch response elicited;Palpable increased muscle length    Cervical multifidi Response  Twitch reponse elicited;Palpable increased muscle length             PT Short Term Goals - 05/18/19 0848      PT SHORT TERM GOAL #1   Title  be independent in initial HEP    Status  Achieved        PT Long Term Goals - 05/12/19 0831      PT LONG TERM GOAL #1  Title  be independent in advanced HEP    Time  8    Period  Weeks    Status  New    Target Date  07/07/19      PT LONG TERM GOAL #2   Title  reduce FOTO to < or = to 44% limitation    Time  8    Period  Weeks    Status  New    Target Date  07/07/19      PT LONG TERM GOAL #3   Title  demonstrate bil cervical A/ROM rotation to > or = to 75 degrees to improve safety with driving    Time  8    Period  Weeks    Status  New    Target Date  07/07/19      PT LONG TERM GOAL #4   Title  report < or =to  3/10 neck pain with ADLs and self-care    Time  8    Period  Weeks    Status  New    Target Date  07/07/19            Plan - 05/21/19 0932    Clinical Impression Statement  Pt responded well to dry needling to the thoracic and scapular pain.  Pt reports increased mobility in the thoracic spine.  Headaches have improved over the past week with needling and change of headache medication.  Pt with tension and trigger points in Rt upper traps, suboccipitals and multifidi.  Pt with reduced tension and  trigger points after manual therapy and dry needling.  Pt with reduced PA mobility C4-5 with mobs and reduced side glide into this motion.  Pt will continue to benefit from skilled PT to address flexibility, strength, and reduced tension and trigger points.    PT Frequency  2x / week    PT Duration  8 weeks    PT Treatment/Interventions  ADLs/Self Care Home Management;Cryotherapy;Electrical Stimulation;Ultrasound;Traction;Moist Heat;Therapeutic activities;Therapeutic exercise;Patient/family education;Neuromuscular re-education;Manual techniques;Passive range of motion;Dry needling;Spinal Manipulations;Taping;Joint Manipulations    PT Next Visit Plan  DN to thoracic and rhomboids, measure cervical A/ROM    Consulted and Agree with Plan of Care  Patient       Patient will benefit from skilled therapeutic intervention in order to improve the following deficits and impairments:  Decreased activity tolerance, Decreased strength, Decreased endurance, Decreased range of motion, Increased muscle spasms, Impaired flexibility, Impaired UE functional use, Postural dysfunction, Pain  Visit Diagnosis: Other headache syndrome  Cervicalgia  Cramp and spasm     Problem List Patient Active Problem List   Diagnosis Date Noted  . Chronic migraine without aura, with intractable migraine, so stated, with status migrainosus 04/30/2019  . Laryngopharyngeal reflux (LPR) 10/30/2017  . Lung nodule 10/30/2017  . Right foot pain 11/04/2012  . Migraine with aura 02/06/2011  . CONSTIPATION, CHRONIC 07/11/2007  . 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 NAUSEA WITH VOMITING 06/13/2007  . 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 RESTLESS LEG SYNDROME 04/02/2007  . INSOMNIA 04/02/2007     [name], PT 05/21/19 9:34 AM  [facility] [address], [address] [place], [place], [address] Phone: [phone]   Fax:  [phone]  Name: [name] [name] MRN: [id number] Date of Birth: 10/17/1965

## 2019-05-24 ENCOUNTER — Other Ambulatory Visit: Payer: Medicare Other

## 2019-05-26 ENCOUNTER — Ambulatory Visit
Admission: RE | Admit: 2019-05-26 | Discharge: 2019-05-26 | Disposition: A | Discharge status: Home / Self Care | Payer: Medicare Other | Source: Ambulatory Visit | Attending: Neurology | Admitting: Neurology

## 2019-05-26 ENCOUNTER — Other Ambulatory Visit: Payer: Self-pay

## 2019-05-26 DIAGNOSIS — R519 Headache, unspecified: Secondary | ICD-10-CM

## 2019-05-26 DIAGNOSIS — R51 Headache with orthostatic component, not elsewhere classified: Secondary | ICD-10-CM | POA: Diagnosis not present

## 2019-05-26 DIAGNOSIS — R200 Anesthesia of skin: Secondary | ICD-10-CM | POA: Diagnosis not present

## 2019-05-26 DIAGNOSIS — G8929 Other chronic pain: Secondary | ICD-10-CM

## 2019-05-26 DIAGNOSIS — H5462 Unqualified visual loss, left eye, normal vision right eye: Secondary | ICD-10-CM

## 2019-05-26 DIAGNOSIS — M5412 Radiculopathy, cervical region: Secondary | ICD-10-CM | POA: Diagnosis not present

## 2019-05-26 DIAGNOSIS — R292 Abnormal reflex: Secondary | ICD-10-CM

## 2019-05-26 DIAGNOSIS — G441 Vascular headache, not elsewhere classified: Secondary | ICD-10-CM | POA: Diagnosis not present

## 2019-05-26 DIAGNOSIS — R29898 Other symptoms and signs involving the musculoskeletal system: Secondary | ICD-10-CM

## 2019-05-26 DIAGNOSIS — M542 Cervicalgia: Secondary | ICD-10-CM

## 2019-05-26 MED ORDER — GADOBENATE DIMEGLUMINE 529 MG/ML IV SOLN
9.0000 mL | Freq: Once | INTRAVENOUS | Status: AC | PRN
  Administered 2019-05-26: 9 mL via INTRAVENOUS

## 2019-05-28 ENCOUNTER — Ambulatory Visit: Payer: BC Managed Care – PPO

## 2019-05-28 ENCOUNTER — Other Ambulatory Visit: Payer: Self-pay

## 2019-05-28 DIAGNOSIS — G4489 Other headache syndrome: Secondary | ICD-10-CM

## 2019-05-28 DIAGNOSIS — M542 Cervicalgia: Secondary | ICD-10-CM

## 2019-05-28 DIAGNOSIS — R252 Cramp and spasm: Secondary | ICD-10-CM | POA: Diagnosis not present

## 2019-05-28 NOTE — Therapy (Signed)
[facility] [address], [address] [place], [place], [address] Phone: [phone]   Fax:  [phone]  Physical Therapy Treatment  Patient Details  Name: [name] [name] MRN: [id number] Date of Birth: 09/03/1965 Referring Provider ([name] [name]): Naomie DeanAhern, Antonia, MD   Encounter Date: 05/28/2019  [name] [name] End of Session - 05/28/19 0839    Visit Number  5    Date for [name] [name] Re-Evaluation  07/07/19    Authorization Type  Medicare/BCBS    [name] [name] Start Time  0758    [name] [name] Stop Time  0841    [name] [name] Time Calculation (min)  43 min    Activity Tolerance  Patient tolerated treatment well    Behavior During Therapy  [facility] for tasks assessed/performed       Past Medical History:  Diagnosis Date  . Anxiety   . Brain fog   . Chronic pain    COMPLEX REGIONAL PAIN SYNDROME  . Hemorrhoids   . History of neck problems    dry needling being done & [name] [name]  . Leg cramping 2020   right, says sometimes she has a hard time putting pressure on that leg   . Migraine headache     Past Surgical History:  Procedure Laterality Date  . CHOLECYSTECTOMY    . DILATION AND CURETTAGE OF UTERUS    . ESOPHAGEAL MANOMETRY N/A 05/05/2018   Procedure: ESOPHAGEAL MANOMETRY (EM);  Surgeon: Vida RiggerMagod, Marc, MD;  Location: [facility];  Service: Endoscopy;  Laterality: N/A;  . SHOULDER ARTHROSCOPY Left 2008  . THORACOSCOPY    . TONSILLECTOMY AND ADENOIDECTOMY     AT AGE 55  . WISDOM TOOTH EXTRACTION      There were no vitals filed for this visit.  Subjective Assessment - 05/28/19 0757    Subjective  I got an Addaday and I am going to use it for my pain.  My neck is stiff.    Currently in Pain?  Yes    Pain Score  3     Pain Location  Neck    Pain Orientation  Right;Left    Pain Descriptors / Indicators  Tightness    Pain Type  Chronic pain    Pain Onset  More than a month ago    Pain Frequency  Constant         [facility] [name] [name] Assessment - 05/28/19 0001      AROM   Cervical - Right  Side Bend  35    Cervical - Left Side Bend  45    Cervical - Right Rotation  50    Cervical - Left Rotation  60                   [facility] Adult [name] [name] Treatment/Exercise - 05/28/19 0001      Manual Therapy   Manual Therapy  Soft tissue mobilization;Myofascial release    Manual therapy comments  suboccipital release and elongation to bil cervical paraspinals     Soft tissue mobilization  passive ROM into rotation and sidebending       Trigger Point Dry Needling - 05/28/19 0001    Consent Given?  Yes    Muscles Treated Head and Neck  Upper trapezius;Oblique capitus;Suboccipitals;Cervical multifidi    Upper Trapezius Response  Twitch reponse elicited;Palpable increased muscle length    Oblique Capitus Response  Twitch response elicited;Palpable increased muscle length    Cervical multifidi Response  Twitch reponse elicited;Palpable increased muscle length  [name] [name] Short Term Goals - 05/28/19 0802      [name] [name] SHORT TERM GOAL #1   Title  be independent in initial HEP    Status  Achieved      [name] [name] SHORT TERM GOAL #2   Title  report < or = to 4-5/10 neck pain with self-care and ADLs    Baseline  3-4/10    Status  Achieved      [name] [name] SHORT TERM GOAL #3   Title  demonstrate bil cervical rotation A/ROM to > or = to 60 degrees to improve safety with driving        [name] [name] Long Term Goals - 05/12/19 0831      [name] [name] LONG TERM GOAL #1   Title  be independent in advanced HEP    Time  8    Period  Weeks    Status  New    Target Date  07/07/19      [name] [name] LONG TERM GOAL #2   Title  reduce FOTO to < or = to 44% limitation    Time  8    Period  Weeks    Status  New    Target Date  07/07/19      [name] [name] LONG TERM GOAL #3   Title  demonstrate bil cervical A/ROM rotation to > or = to 75 degrees to improve safety with driving    Time  8    Period  Weeks    Status  New    Target Date  07/07/19      [name] [name] LONG TERM GOAL #4   Title  report < or =to  3/10 neck pain with ADLs and self-care     Time  8    Period  Weeks    Status  New    Target Date  07/07/19            Plan - 05/28/19 0809    Clinical Impression Statement  [name] [name] continues to work on HEP at home for flexibility and strength.  [name] [name] is limited to using bands due to shoulder pain.  [name] [name] now has an Addaday for tissue mobilization at home.  [name] [name] provided verbal review of HEP to ensure that [name] [name] is performing a comprehensive program.  [name] [name] with 3-4/10 neck pain with self-care and ADLs and has fewer headaches overall.  [name] [name] reports a 60% reduction in headaches.  Cervical A/ROM is improved and continues to be limited to the Rt vs the Lt.  [name] [name] with tension and trigger points in bil neck and thoracic spine and demonstrated improved tissue mobility and reduced pain after manual and dry needling today.  [name] [name] will continue to benefit from skilled [name] [name] for flexibility, postural strength and segmental mobility.    [name] [name] Frequency  2x / week    [name] [name] Duration  8 weeks    [name] [name] Treatment/Interventions  ADLs/Self Care Home Management;Cryotherapy;Electrical Stimulation;Ultrasound;Traction;Moist Heat;Therapeutic activities;Therapeutic exercise;Patient/family education;Neuromuscular re-education;Manual techniques;Passive range of motion;Dry needling;Spinal Manipulations;Taping;Joint Manipulations    [name] [name] Next Visit Plan  continue dry needling to neck and thoracic spine    Consulted and Agree with Plan of Care  Patient       Patient will benefit from skilled therapeutic intervention in order to improve the following deficits and impairments:  Decreased activity tolerance, Decreased strength, Decreased endurance, Decreased range of motion, Increased muscle spasms, Impaired flexibility, Impaired UE functional use, Postural dysfunction, Pain  Visit Diagnosis: Other headache syndrome  Cervicalgia  Cramp and spasm     Problem  List Patient Active Problem List   Diagnosis Date Noted  . Chronic migraine without aura, with intractable migraine, so stated, with  status migrainosus 04/30/2019  . Laryngopharyngeal reflux (LPR) 10/30/2017  . Lung nodule 10/30/2017  . Right foot pain 11/04/2012  . Migraine with aura 02/06/2011  . CONSTIPATION, CHRONIC 07/11/2007  . 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 NAUSEA WITH VOMITING 06/13/2007  . 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 RESTLESS LEG SYNDROME 04/02/2007  . INSOMNIA 04/02/2007     [name] [name], [name] [name] 05/28/19 8:43 AM  [facility] [address], [address] [place], [place], [address] Phone: [phone]   Fax:  [phone]  Name: [name] [name] MRN: [id number] Date of Birth: 1966/05/23

## 2019-06-01 ENCOUNTER — Ambulatory Visit: Payer: BC Managed Care – PPO

## 2019-06-01 ENCOUNTER — Other Ambulatory Visit: Payer: Self-pay

## 2019-06-01 DIAGNOSIS — M542 Cervicalgia: Secondary | ICD-10-CM | POA: Diagnosis not present

## 2019-06-01 DIAGNOSIS — G4489 Other headache syndrome: Secondary | ICD-10-CM | POA: Diagnosis not present

## 2019-06-01 DIAGNOSIS — R252 Cramp and spasm: Secondary | ICD-10-CM | POA: Diagnosis not present

## 2019-06-01 NOTE — Therapy (Signed)
[facility] [address], [place], [place], [address] Phone: [phone]   Fax:  [phone]  Physical Therapy Treatment  Patient Details  Name: [name] [name] MRN: [id number] Date of Birth: Mar 01, 1966 Referring Provider (PT): Sarina Ill, MD   Encounter Date: 06/01/2019  PT End of Session - 06/01/19 0840    Visit Number  6    Date for PT Re-Evaluation  07/07/19    Authorization Type  Medicare/BCBS    PT Start Time  0800    PT Stop Time  0842    PT Time Calculation (min)  42 min    Activity Tolerance  Patient tolerated treatment well    Behavior During Therapy  [facility] for tasks assessed/performed       Past Medical History:  Diagnosis Date  . Anxiety   . Brain fog   . Chronic pain    COMPLEX REGIONAL PAIN SYNDROME  . Hemorrhoids   . History of neck problems    dry needling being done & PT  . Leg cramping 2020   right, says sometimes she has a hard time putting pressure on that leg   . Migraine headache     Past Surgical History:  Procedure Laterality Date  . CHOLECYSTECTOMY    . DILATION AND CURETTAGE OF UTERUS    . ESOPHAGEAL MANOMETRY N/A 05/05/2018   Procedure: ESOPHAGEAL MANOMETRY (EM);  Surgeon: Clarene Essex, MD;  Location: [facility];  Service: Endoscopy;  Laterality: N/A;  . SHOULDER ARTHROSCOPY Left 2008  . THORACOSCOPY    . TONSILLECTOMY AND ADENOIDECTOMY     AT AGE 34  . WISDOM TOOTH EXTRACTION      There were no vitals filed for this visit.                    [place] Adult PT Treatment/Exercise - 06/01/19 0001      Shoulder Exercises: Seated   Other Seated Exercises  thoracic rotation 3x20 seconds      Manual Therapy   Manual Therapy  Soft tissue mobilization;Myofascial release    Manual therapy comments  thoracic musculature and PA mobs in thoracic spine       Trigger Point Dry Needling - 06/01/19 0001    Consent Given?  Yes    Muscles Treated Head and Neck   Upper trapezius    Muscles Treated Upper Quadrant  Rhomboids;Subscapularis    Other Dry Needling  thoracic multifidi    Upper Trapezius Response  Twitch reponse elicited;Palpable increased muscle length    Rhomboids Response  Palpable increased muscle length;Twitch response elicited    Subscapularis Response  Twitch response elicited;Palpable increased muscle length           PT Education - 06/01/19 0840    Education Details  verbal review of HEP    Person(s) Educated  Patient    Methods  Explanation    Comprehension  Verbalized understanding;Returned demonstration       PT Short Term Goals - 05/28/19 0802      PT SHORT TERM GOAL #1   Title  be independent in initial HEP    Status  Achieved      PT SHORT TERM GOAL #2   Title  report < or = to 4-5/10 neck pain with self-care and ADLs    Baseline  3-4/10    Status  Achieved      PT SHORT TERM GOAL #3   Title  demonstrate bil cervical rotation  A/ROM to > or = to 60 degrees to improve safety with driving        PT Long Term Goals - 05/12/19 0831      PT LONG TERM GOAL #1   Title  be independent in advanced HEP    Time  8    Period  Weeks    Status  New    Target Date  07/07/19      PT LONG TERM GOAL #2   Title  reduce FOTO to < or = to 44% limitation    Time  8    Period  Weeks    Status  New    Target Date  07/07/19      PT LONG TERM GOAL #3   Title  demonstrate bil cervical A/ROM rotation to > or = to 75 degrees to improve safety with driving    Time  8    Period  Weeks    Status  New    Target Date  07/07/19      PT LONG TERM GOAL #4   Title  report < or =to  3/10 neck pain with ADLs and self-care    Time  8    Period  Weeks    Status  New    Target Date  07/07/19            Plan - 06/01/19 0814    Clinical Impression Statement  Pt is continuing to work on HEP at home for strength and flexibility.  Pt does not want to add additional exercises due to increased shoulder pain with use of bands.   PT reviewed HEP to determine technique. MRI of cervical spine was normal.  Pt with tension and trigger points in thoracic spine, supscapularis and rhomboids and demonstrated improved tissue and thoracic segmental mobility after dry needling and manual today.  Pt will continue to benefit from skilled PT to address pain and tissue mobility.    PT Frequency  2x / week    PT Duration  8 weeks    PT Treatment/Interventions  ADLs/Self Care Home Management;Cryotherapy;Electrical Stimulation;Ultrasound;Traction;Moist Heat;Therapeutic activities;Therapeutic exercise;Patient/family education;Neuromuscular re-education;Manual techniques;Passive range of motion;Dry needling;Spinal Manipulations;Taping;Joint Manipulations    PT Next Visit Plan  assess response to dry needling, thoracic mobility/strength.  Try prone extension    Recommended Other Services  initial cert is signed    Consulted and Agree with Plan of Care  Patient       Patient will benefit from skilled therapeutic intervention in order to improve the following deficits and impairments:  Decreased activity tolerance, Decreased strength, Decreased endurance, Decreased range of motion, Increased muscle spasms, Impaired flexibility, Impaired UE functional use, Postural dysfunction, Pain  Visit Diagnosis: Cervicalgia  Other headache syndrome  Cramp and spasm     Problem List Patient Active Problem List   Diagnosis Date Noted  . Chronic migraine without aura, with intractable migraine, so stated, with status migrainosus 04/30/2019  . Laryngopharyngeal reflux (LPR) 10/30/2017  . Lung nodule 10/30/2017  . Right foot pain 11/04/2012  . Migraine with aura 02/06/2011  . CONSTIPATION, CHRONIC 07/11/2007  . 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 NAUSEA WITH VOMITING 06/13/2007  . 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 RESTLESS LEG SYNDROME 04/02/2007  . INSOMNIA 04/02/2007     [name], PT 06/01/19 8:42 AM  [facility] [address] [place], [place], [address] Phone: [phone]   Fax:  [phone]  Name: [name] [name] MRN:  [id number] Date of Birth: 06/14/1965

## 2019-06-03 ENCOUNTER — Other Ambulatory Visit: Payer: Self-pay

## 2019-06-03 ENCOUNTER — Ambulatory Visit: Payer: BC Managed Care – PPO

## 2019-06-03 DIAGNOSIS — R252 Cramp and spasm: Secondary | ICD-10-CM | POA: Diagnosis not present

## 2019-06-03 DIAGNOSIS — G4489 Other headache syndrome: Secondary | ICD-10-CM

## 2019-06-03 DIAGNOSIS — M542 Cervicalgia: Secondary | ICD-10-CM

## 2019-06-03 NOTE — Therapy (Signed)
[facility] [address], [address] [place], [place], [address] Phone: [phone]   Fax:  [phone]  Physical Therapy Treatment  Patient Details  Name: [name] [name] MRN: [id number] Date of Birth: Oct 30, 1965 Referring Provider (PT): Naomie Dean, MD   Encounter Date: 06/03/2019  PT End of Session - 06/03/19 0930    Visit Number  7    Date for PT Re-Evaluation  07/07/19    Authorization Type  Medicare/BCBS    PT Start Time  0850    PT Stop Time  0930    PT Time Calculation (min)  40 min    Activity Tolerance  Patient tolerated treatment well    Behavior During Therapy  [facility] for tasks assessed/performed       Past Medical History:  Diagnosis Date  . Anxiety   . Brain fog   . Chronic pain    COMPLEX REGIONAL PAIN SYNDROME  . Hemorrhoids   . History of neck problems    dry needling being done & PT  . Leg cramping 2020   right, says sometimes she has a hard time putting pressure on that leg   . Migraine headache     Past Surgical History:  Procedure Laterality Date  . CHOLECYSTECTOMY    . DILATION AND CURETTAGE OF UTERUS    . ESOPHAGEAL MANOMETRY N/A 05/05/2018   Procedure: ESOPHAGEAL MANOMETRY (EM);  Surgeon: Vida Rigger, MD;  Location: [facility];  Service: Endoscopy;  Laterality: N/A;  . SHOULDER ARTHROSCOPY Left 2008  . THORACOSCOPY    . TONSILLECTOMY AND ADENOIDECTOMY     AT AGE 11  . WISDOM TOOTH EXTRACTION      There were no vitals filed for this visit.  Subjective Assessment - 06/03/19 0852    Subjective  What you did last time made a huge difference.  I feel 60% better since the start of care.    Currently in Pain?  Yes    Pain Score  2     Pain Location  Neck    Pain Orientation  Right;Left         [facility] PT Assessment - 06/03/19 0001      AROM   Cervical - Right Rotation  50    Cervical - Left Rotation  65                   [facility] Adult PT Treatment/Exercise -  06/03/19 0001      Shoulder Exercises: Supine   Other Supine Exercises  MELT method: foam roll for neck       Manual Therapy   Manual Therapy  Soft tissue mobilization;Myofascial release    Manual therapy comments  suboccipital release and elongation to bil cervical paraspinals     Soft tissue mobilization  passive ROM into rotation and sidebending       Trigger Point Dry Needling - 06/03/19 0001    Consent Given?  Yes    Muscles Treated Head and Neck  Upper trapezius;Oblique capitus;Suboccipitals;Cervical multifidi    Upper Trapezius Response  Twitch reponse elicited;Palpable increased muscle length    Oblique Capitus Response  Twitch response elicited;Palpable increased muscle length    Cervical multifidi Response  Twitch reponse elicited;Palpable increased muscle length             PT Short Term Goals - 06/03/19 0930      PT SHORT TERM GOAL #3   Title  demonstrate bil cervical rotation A/ROM to > or =  to 60 degrees to improve safety with driving    Baseline  Rt 50, Lt 65    Time  4    Period  Weeks    Status  On-going        PT Long Term Goals - 06/03/19 0904      PT LONG TERM GOAL #4   Title  report < or =to  3/10 neck pain with ADLs and self-care    Time  8    Period  Weeks    Status  On-going            Plan - 06/03/19 0856    Clinical Impression Statement  Pt continues to report reduced pain and frequency of headaches by 60%. PT instructed pt on Melt Method to address tissue mobility and blood flow in the suboccipitals and cervical paraspinal musculature.  Pt was able to demonstrate all aspects correctly and YouTube information was provided for home use.  Pt with tension and trigger points in suboccipitals, upper traps and cervical paraspinals.  This tension is significantly reduced since the start of care.  Pt demonstrated improved tissue mobility after manual therapy today.  Pt continues to be independent and compliant with HEP for strength and  flexibility.  Pt will continue to benefit from skilled PT to address flexibility, postural strength and segmental/tissue mobility to address chronic pain.    PT Frequency  2x / week    PT Duration  8 weeks    PT Treatment/Interventions  ADLs/Self Care Home Management;Cryotherapy;Electrical Stimulation;Ultrasound;Traction;Moist Heat;Therapeutic activities;Therapeutic exercise;Patient/family education;Neuromuscular re-education;Manual techniques;Passive range of motion;Dry needling;Spinal Manipulations;Taping;Joint Manipulations    PT Next Visit Plan  review Melt Method, show other stretches on foam roll    Consulted and Agree with Plan of Care  Patient       Patient will benefit from skilled therapeutic intervention in order to improve the following deficits and impairments:  Decreased activity tolerance, Decreased strength, Decreased endurance, Decreased range of motion, Increased muscle spasms, Impaired flexibility, Impaired UE functional use, Postural dysfunction, Pain  Visit Diagnosis: Cervicalgia  Other headache syndrome  Cramp and spasm     Problem List Patient Active Problem List   Diagnosis Date Noted  . Chronic migraine without aura, with intractable migraine, so stated, with status migrainosus 04/30/2019  . Laryngopharyngeal reflux (LPR) 10/30/2017  . Lung nodule 10/30/2017  . Right foot pain 11/04/2012  . Migraine with aura 02/06/2011  . CONSTIPATION, CHRONIC 07/11/2007  . 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 NAUSEA WITH VOMITING 06/13/2007  . 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 RESTLESS LEG SYNDROME 04/02/2007  . INSOMNIA 04/02/2007    Sigurd Sos, PT 06/03/19 9:31 AM  [facility] [address] [place], [place], [address] Phone: [phone]   Fax:  [phone]  Name: [name] [name] MRN: [id number] Date of Birth: April 01, 1966

## 2019-06-09 ENCOUNTER — Other Ambulatory Visit: Payer: Self-pay

## 2019-06-09 ENCOUNTER — Ambulatory Visit: Payer: BC Managed Care – PPO

## 2019-06-09 DIAGNOSIS — R252 Cramp and spasm: Secondary | ICD-10-CM | POA: Diagnosis not present

## 2019-06-09 DIAGNOSIS — G4489 Other headache syndrome: Secondary | ICD-10-CM | POA: Diagnosis not present

## 2019-06-09 DIAGNOSIS — M542 Cervicalgia: Secondary | ICD-10-CM | POA: Diagnosis not present

## 2019-06-09 NOTE — Therapy (Signed)
[facility] [address] [place], [place], [address] Phone: [phone]   Fax:  [phone]  Physical Therapy Treatment  Patient Details  Name: [name] [name] MRN: [id number] Date of Birth: 1966/01/16 Referring Provider (PT): Naomie Dean, MD   Encounter Date: 06/09/2019  PT End of Session - 06/09/19 0928    Visit Number  8    Date for PT Re-Evaluation  07/07/19    Authorization Type  Medicare/BCBS    PT Start Time  0849   dry needling   PT Stop Time  0927    PT Time Calculation (min)  38 min    Activity Tolerance  Patient tolerated treatment well    Behavior During Therapy  [facility] for tasks assessed/performed       Past Medical History:  Diagnosis Date  . Anxiety   . Brain fog   . Chronic pain    COMPLEX REGIONAL PAIN SYNDROME  . Hemorrhoids   . History of neck problems    dry needling being done & PT  . Leg cramping 2020   right, says sometimes she has a hard time putting pressure on that leg   . Migraine headache     Past Surgical History:  Procedure Laterality Date  . CHOLECYSTECTOMY    . DILATION AND CURETTAGE OF UTERUS    . ESOPHAGEAL MANOMETRY N/A 05/05/2018   Procedure: ESOPHAGEAL MANOMETRY (EM);  Surgeon: Vida Rigger, MD;  Location: [facility];  Service: Endoscopy;  Laterality: N/A;  . SHOULDER ARTHROSCOPY Left 2008  . THORACOSCOPY    . TONSILLECTOMY AND ADENOIDECTOMY     AT AGE 7  . WISDOM TOOTH EXTRACTION      There were no vitals filed for this visit.  Subjective Assessment - 06/09/19 0849    Subjective  I am still having Rt sided pain in my neck. Needling to my scapular area helped.  My pain is less frequent now.    Currently in Pain?  Yes                       [facility] Adult PT Treatment/Exercise - 06/09/19 0001      Manual Therapy   Manual Therapy  Soft tissue mobilization;Myofascial release    Manual therapy comments  thoracic musculature and PA mobs in  thoracic spine       Trigger Point Dry Needling - 06/09/19 0001    Muscles Treated Upper Quadrant  Rhomboids;Subscapularis    Other Dry Needling  thoracic multifidi    Upper Trapezius Response  Twitch reponse elicited;Palpable increased muscle length    Rhomboids Response  Palpable increased muscle length;Twitch response elicited    Subscapularis Response  Twitch response elicited;Palpable increased muscle length             PT Short Term Goals - 06/03/19 0930      PT SHORT TERM GOAL #3   Title  demonstrate bil cervical rotation A/ROM to > or = to 60 degrees to improve safety with driving    Baseline  Rt 50, Lt 65    Time  4    Period  Weeks    Status  On-going        PT Long Term Goals - 06/09/19 8588      PT LONG TERM GOAL #1   Title  be independent in advanced HEP    Time  8    Period  Weeks    Status  On-going  PT LONG TERM GOAL #3   Title  demonstrate bil cervical A/ROM rotation to > or = to 75 degrees to improve safety with driving    Time  8    Period  Weeks    Status  On-going            Plan - 06/09/19 5631    Clinical Impression Statement  Pt with >60% improvement in symptoms since the start of care.  Pt is working on strength and flexibility regularly at home.  Cervical A/ROM rotation to the Rt continues to be limited but improving.  Pt with tension in thoracic multifidi and rhomboids and this is improved from initial evaluation.  Pt demonstrated improved tissue mobility after manual therapy today.  Pt will continue to benefit from skilled PT to address thoracic and cervical mobility and endurance.    PT Frequency  2x / week    PT Duration  8 weeks    PT Treatment/Interventions  ADLs/Self Care Home Management;Cryotherapy;Electrical Stimulation;Ultrasound;Traction;Moist Heat;Therapeutic activities;Therapeutic exercise;Patient/family education;Neuromuscular re-education;Manual techniques;Passive range of motion;Dry needling;Spinal  Manipulations;Taping;Joint Manipulations    PT Next Visit Plan  review Melt Method, show other stretches on foam roll.  DN to neck if needed    Consulted and Agree with Plan of Care  Patient       Patient will benefit from skilled therapeutic intervention in order to improve the following deficits and impairments:  Decreased activity tolerance, Decreased strength, Decreased endurance, Decreased range of motion, Increased muscle spasms, Impaired flexibility, Impaired UE functional use, Postural dysfunction, Pain  Visit Diagnosis: Cervicalgia  Other headache syndrome  Cramp and spasm     Problem List Patient Active Problem List   Diagnosis Date Noted  . Chronic migraine without aura, with intractable migraine, so stated, with status migrainosus 04/30/2019  . Laryngopharyngeal reflux (LPR) 10/30/2017  . Lung nodule 10/30/2017  . Right foot pain 11/04/2012  . Migraine with aura 02/06/2011  . CONSTIPATION, CHRONIC 07/11/2007  . 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 NAUSEA WITH VOMITING 06/13/2007  . 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 RESTLESS LEG SYNDROME 04/02/2007  . INSOMNIA 04/02/2007    Sigurd Sos, PT 06/09/19 9:30 AM  [facility] [address] [place], [place], [address] Phone: [phone]   Fax:  [phone]  Name: LAKIRA OGANDO MRN: [id number] Date of Birth: [birth year]

## 2019-06-16 ENCOUNTER — Ambulatory Visit: Payer: BC Managed Care – PPO | Attending: Neurology

## 2019-06-16 ENCOUNTER — Other Ambulatory Visit: Payer: Self-pay

## 2019-06-16 DIAGNOSIS — M542 Cervicalgia: Secondary | ICD-10-CM | POA: Diagnosis not present

## 2019-06-16 DIAGNOSIS — G4489 Other headache syndrome: Secondary | ICD-10-CM

## 2019-06-16 DIAGNOSIS — R252 Cramp and spasm: Secondary | ICD-10-CM | POA: Insufficient documentation

## 2019-06-16 DIAGNOSIS — R0982 Postnasal drip: Secondary | ICD-10-CM | POA: Diagnosis not present

## 2019-06-16 DIAGNOSIS — R0981 Nasal congestion: Secondary | ICD-10-CM | POA: Insufficient documentation

## 2019-06-16 NOTE — Therapy (Signed)
[facility] [address] [place], [place], [address] Phone: [phone]   Fax:  [phone]  Physical Therapy Treatment  Patient Details  Name: [name] [name] MRN: [id number] Date of Birth: 02/04/1966 Referring Provider (PT): Naomie Dean, MD   Encounter Date: 06/16/2019  PT End of Session - 06/16/19 0842    Visit Number  9    Date for PT Re-Evaluation  07/07/19    Authorization Type  Medicare/BCBS    PT Start Time  0805   DN   PT Stop Time  0844    PT Time Calculation (min)  39 min    Activity Tolerance  Patient tolerated treatment well    Behavior During Therapy  [facility] for tasks assessed/performed       Past Medical History:  Diagnosis Date  . Anxiety   . Brain fog   . Chronic pain    COMPLEX REGIONAL PAIN SYNDROME  . Hemorrhoids   . History of neck problems    dry needling being done & PT  . Leg cramping 2020   right, says sometimes she has a hard time putting pressure on that leg   . Migraine headache     Past Surgical History:  Procedure Laterality Date  . CHOLECYSTECTOMY    . DILATION AND CURETTAGE OF UTERUS    . ESOPHAGEAL MANOMETRY N/A 05/05/2018   Procedure: ESOPHAGEAL MANOMETRY (EM);  Surgeon: Vida Rigger, MD;  Location: [facility];  Service: Endoscopy;  Laterality: N/A;  . SHOULDER ARTHROSCOPY Left 2008  . THORACOSCOPY    . TONSILLECTOMY AND ADENOIDECTOMY     AT AGE 32  . WISDOM TOOTH EXTRACTION      There were no vitals filed for this visit.  Subjective Assessment - 06/16/19 0807    Subjective  I am feeling better overall- 60%. I think I can drop down to 1x/wk because I am feeling so good.    Currently in Pain?  Yes    Pain Score  2    4-5/10   Pain Location  Neck    Pain Orientation  Right;Left    Pain Descriptors / Indicators  Tightness    Pain Type  Chronic pain    Pain Onset  More than a month ago    Pain Frequency  Constant    Aggravating Factors   end of the day    Pain Relieving Factors  ice, heat, theracane                       [facility] Adult PT Treatment/Exercise - 06/16/19 0001      Manual Therapy   Manual Therapy  Soft tissue mobilization;Myofascial release    Manual therapy comments  thoracic and cervical musculature and PA mobs in thoracic spine       Trigger Point Dry Needling - 06/16/19 0001    Consent Given?  Yes    Muscles Treated Upper Quadrant  Rhomboids;Subscapularis    Other Dry Needling  thoracic multifidi    Upper Trapezius Response  Twitch reponse elicited;Palpable increased muscle length    Oblique Capitus Response  Twitch response elicited;Palpable increased muscle length    Cervical multifidi Response  Twitch reponse elicited;Palpable increased muscle length    Rhomboids Response  Palpable increased muscle length;Twitch response elicited    Subscapularis Response  Twitch response elicited;Palpable increased muscle length             PT Short Term Goals - 06/03/19  0930      PT SHORT TERM GOAL #3   Title  demonstrate bil cervical rotation A/ROM to > or = to 60 degrees to improve safety with driving    Baseline  Rt 50, Lt 65    Time  4    Period  Weeks    Status  On-going        PT Long Term Goals - 06/09/19 0854      PT LONG TERM GOAL #1   Title  be independent in advanced HEP    Time  8    Period  Weeks    Status  On-going      PT LONG TERM GOAL #3   Title  demonstrate bil cervical A/ROM rotation to > or = to 75 degrees to improve safety with driving    Time  8    Period  Weeks    Status  On-going            Plan - 06/16/19 7782    Clinical Impression Statement  Pt with significant improvement in symptoms overall.  Pt reports 60% overall pain reduction and is consistent in HEP for strength and flexibility.  Pt will reduce to 1x/wk with emphasis on dry needling/manual and advancement/modification of HEP as needed.  Pt with improved tension and trigger points in cervical and thoracic  spine overall and responds well to dry needling with good twitch and response from all musculature.  Pt will continue to benefit from skilled PT to address chronic pain and headaches.    PT Frequency  2x / week    PT Duration  8 weeks    PT Treatment/Interventions  ADLs/Self Care Home Management;Cryotherapy;Electrical Stimulation;Ultrasound;Traction;Moist Heat;Therapeutic activities;Therapeutic exercise;Patient/family education;Neuromuscular re-education;Manual techniques;Passive range of motion;Dry needling;Spinal Manipulations;Taping;Joint Manipulations    PT Next Visit Plan  once pt gets foam roll, show other stretches and review Melt.  Continue dry needling.  Reduce to 1x/wk    Consulted and Agree with Plan of Care  Patient       Patient will benefit from skilled therapeutic intervention in order to improve the following deficits and impairments:  Decreased activity tolerance, Decreased strength, Decreased endurance, Decreased range of motion, Increased muscle spasms, Impaired flexibility, Impaired UE functional use, Postural dysfunction, Pain  Visit Diagnosis: Cervicalgia  Other headache syndrome  Cramp and spasm     Problem List Patient Active Problem List   Diagnosis Date Noted  . Chronic migraine without aura, with intractable migraine, so stated, with status migrainosus 04/30/2019  . Laryngopharyngeal reflux (LPR) 10/30/2017  . Lung nodule 10/30/2017  . Right foot pain 11/04/2012  . Migraine with aura 02/06/2011  . CONSTIPATION, CHRONIC 07/11/2007  . 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 NAUSEA WITH VOMITING 06/13/2007  . 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 RESTLESS LEG SYNDROME 04/02/2007  . INSOMNIA 04/02/2007   Sigurd Sos, PT 06/16/19 8:44 AM  [facility] [address], [place], [place], [address] Phone: [phone]   Fax:  [phone]  Name: PALMER SHOREY MRN: [id number] Date of  Birth: 01-22-1966

## 2019-06-23 ENCOUNTER — Other Ambulatory Visit: Payer: Self-pay

## 2019-06-23 ENCOUNTER — Ambulatory Visit: Payer: BC Managed Care – PPO

## 2019-06-23 DIAGNOSIS — M542 Cervicalgia: Secondary | ICD-10-CM

## 2019-06-23 DIAGNOSIS — G4489 Other headache syndrome: Secondary | ICD-10-CM

## 2019-06-23 DIAGNOSIS — R252 Cramp and spasm: Secondary | ICD-10-CM

## 2019-06-23 NOTE — Therapy (Signed)
[facility] [address], [place], [place], [address] Phone: [phone]   Fax:  [phone]  Physical Therapy Treatment  Patient Details  Name: [name] [name] MRN: [id number] Date of Birth: [birth year] Referring Provider (PT): Sarina Ill, MD   Encounter Date: 06/23/2019 Progress Note Reporting Period 05/12/2019  to 06/23/2019  See note below for Objective Data and Assessment of Progress/Goals.      PT End of Session - 06/23/19 0846    Visit Number  10    Date for PT Re-Evaluation  07/07/19    Authorization Type  Medicare/BCBS    PT Start Time  0803    PT Stop Time  0845    PT Time Calculation (min)  42 min    Activity Tolerance  Patient tolerated treatment well    Behavior During Therapy  [facility] for tasks assessed/performed       Past Medical History:  Diagnosis Date  . Anxiety   . Brain fog   . Chronic pain    COMPLEX REGIONAL PAIN SYNDROME  . Hemorrhoids   . History of neck problems    dry needling being done & PT  . Leg cramping 2020   right, says sometimes she has a hard time putting pressure on that leg   . Migraine headache     Past Surgical History:  Procedure Laterality Date  . CHOLECYSTECTOMY    . DILATION AND CURETTAGE OF UTERUS    . ESOPHAGEAL MANOMETRY N/A 05/05/2018   Procedure: ESOPHAGEAL MANOMETRY (EM);  Surgeon: Clarene Essex, MD;  Location: [facility];  Service: Endoscopy;  Laterality: N/A;  . SHOULDER ARTHROSCOPY Left 2008  . THORACOSCOPY    . TONSILLECTOMY AND ADENOIDECTOMY     AT AGE 54  . WISDOM TOOTH EXTRACTION      There were no vitals filed for this visit.  Subjective Assessment - 06/23/19 0807    Subjective  I am still feeling better.  I am feeling much better overall.   I don't have to use my theracane and no muscle tension headaches in a couple of weeks.    Currently in Pain?  Yes    Pain Score  2     Pain Location  Neck    Pain Orientation  Right;Left    Pain  Descriptors / Indicators  Tightness    Pain Type  Chronic pain    Pain Onset  More than a month ago    Pain Frequency  Constant    Aggravating Factors   endurance tasks    Pain Relieving Factors  ice, heat, rest         [facility] PT Assessment - 06/23/19 0001      Assessment   Medical Diagnosis  chronic migraine without aura, cervical myofascial pain syndrome    Referring Provider (PT)  Sarina Ill, MD    Onset Date/Surgical Date  11/10/18    Hand Dominance  Right      Prior Function   Level of Independence  Independent      Observation/Other Assessments   Focus on Therapeutic Outcomes (FOTO)   41% limitation      AROM   Cervical Flexion  70    Cervical Extension  50    Cervical - Right Side Bend  40    Cervical - Left Side Bend  45    Cervical - Right Rotation  60    Cervical - Left Rotation  70  [place] [facility] PT Treatment/Exercise - 06/23/19 0001      Manual Therapy   Manual Therapy  Soft tissue mobilization;Myofascial release    Manual therapy comments  thoracic and cervical musculature and PA mobs in thoracic spine       Trigger Point Dry Needling - 06/23/19 0001    Consent Given?  Yes    Muscles Treated Upper Quadrant  Rhomboids;Subscapularis    Other Dry Needling  thoracic multifidi    Upper Trapezius Response  Twitch reponse elicited;Palpable increased muscle length    Oblique Capitus Response  Twitch response elicited;Palpable increased muscle length    Cervical multifidi Response  Twitch reponse elicited;Palpable increased muscle length    Rhomboids Response  Palpable increased muscle length;Twitch response elicited    Subscapularis Response  Twitch response elicited;Palpable increased muscle length             PT Short Term Goals - 06/03/19 0930      PT SHORT TERM GOAL #3   Title  demonstrate bil cervical rotation A/ROM to > or = to 60 degrees to improve safety with driving    Baseline  Rt 50, Lt 65    Time  4     Period  Weeks    Status  On-going        PT Long Term Goals - 06/23/19 0809      PT LONG TERM GOAL #2   Title  reduce FOTO to < or = to 44% limitation    Baseline  41% limitation    Status  Achieved      PT LONG TERM GOAL #3   Title  demonstrate bil cervical A/ROM rotation to > or = to 75 degrees to improve safety with driving    Time  8    Period  Weeks    Status  On-going      PT LONG TERM GOAL #4   Title  report < or =to  3/10 neck pain with ADLs and self-care    Baseline  variable- met at least 50% of the time    Status  On-going            Plan - 06/23/19 0846    Clinical Impression Statement  Pt continues to report that she is not having headaches.  Pt demonstrated improved cervical A/ROM in all directions with measures today.  FOTO is improved to 41% limitation-goal met.  Pt continues to perform flexibility and strength exercises at home.  Pt with trigger points in bil upper traps and Rt scapular musculature and this is improved overall.  Pt demonstrated improved tissue mobility after manual therapy today.  Pt will continue to benefit form skilled PT to address thoracic and cervical tissue mobility and reduce pain.    PT Frequency  2x / week    PT Duration  8 weeks    PT Treatment/Interventions  ADLs/Self Care Home Management;Cryotherapy;Electrical Stimulation;Ultrasound;Traction;Moist Heat;Therapeutic activities;Therapeutic exercise;Patient/family education;Neuromuscular re-education;Manual techniques;Passive range of motion;Dry needling;Spinal Manipulations;Taping;Joint Manipulations    PT Next Visit Plan  once pt gets foam roll, show other stretches and review Melt.  Continue dry needling.  Reduce to 1x/wk    Consulted and Agree with Plan of Care  Patient       Patient will benefit from skilled therapeutic intervention in order to improve the following deficits and impairments:  Decreased activity tolerance, Decreased strength, Decreased endurance, Decreased range  of motion, Increased muscle spasms, Impaired flexibility, Impaired UE functional use, Postural dysfunction, Pain  Visit Diagnosis: Other headache syndrome  Cervicalgia  Cramp and spasm     Problem List Patient Active Problem List   Diagnosis Date Noted  . Chronic migraine without aura, with intractable migraine, so stated, with status migrainosus 04/30/2019  . Laryngopharyngeal reflux (LPR) 10/30/2017  . Lung nodule 10/30/2017  . Right foot pain 11/04/2012  . Migraine with aura 02/06/2011  . CONSTIPATION, CHRONIC 07/11/2007  . 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 NAUSEA WITH VOMITING 06/13/2007  . 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 RESTLESS LEG SYNDROME 04/02/2007  . INSOMNIA 04/02/2007    Sigurd Sos, PT 06/23/19 8:48 AM  [facility] [address] [place], [place], [address] Phone: [phone]   Fax:  [phone]  Name: [name] [name] MRN: [id number] Date of Birth: Oct 03, 1965

## 2019-07-07 ENCOUNTER — Ambulatory Visit: Payer: BC Managed Care – PPO

## 2019-07-14 ENCOUNTER — Ambulatory Visit: Payer: BC Managed Care – PPO | Attending: Neurology

## 2019-07-14 ENCOUNTER — Other Ambulatory Visit: Payer: Self-pay

## 2019-07-14 DIAGNOSIS — M542 Cervicalgia: Secondary | ICD-10-CM | POA: Diagnosis not present

## 2019-07-14 DIAGNOSIS — G4489 Other headache syndrome: Secondary | ICD-10-CM | POA: Diagnosis not present

## 2019-07-14 DIAGNOSIS — R252 Cramp and spasm: Secondary | ICD-10-CM

## 2019-07-14 NOTE — Therapy (Signed)
[facility] [address] [place], [place], [address] Phone: [phone]   Fax:  [phone]  Physical Therapy Treatment  Patient Details  Name: [name] [name] MRN: [id number] Date of Birth: November 17, 1965 Referring Provider (PT): Sarina Ill, MD   Encounter Date: 07/14/2019  PT End of Session - 07/14/19 1100    Visit Number  11    Date for PT Re-Evaluation  08/19/19    Authorization Type  Medicare/BCBS    PT Start Time  1017    PT Stop Time  1101    PT Time Calculation (min)  44 min    Activity Tolerance  Patient tolerated treatment well    Behavior During Therapy  [facility] for tasks assessed/performed       Past Medical History:  Diagnosis Date  . Anxiety   . Brain fog   . Chronic pain    COMPLEX REGIONAL PAIN SYNDROME  . Hemorrhoids   . History of neck problems    dry needling being done & PT  . Leg cramping 2020   right, says sometimes she has a hard time putting pressure on that leg   . Migraine headache     Past Surgical History:  Procedure Laterality Date  . CHOLECYSTECTOMY    . DILATION AND CURETTAGE OF UTERUS    . ESOPHAGEAL MANOMETRY N/A 05/05/2018   Procedure: ESOPHAGEAL MANOMETRY (EM);  Surgeon: Clarene Essex, MD;  Location: [facility];  Service: Endoscopy;  Laterality: N/A;  . SHOULDER ARTHROSCOPY Left 2008  . THORACOSCOPY    . TONSILLECTOMY AND ADENOIDECTOMY     AT AGE 54  . WISDOM TOOTH EXTRACTION      There were no vitals filed for this visit.  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  I have been using CBD cream and it really helps.  I feel 70% overall improvement.    Patient Stated Goals  reduce neck pain and headaches    Currently in Pain?  Yes    Pain Score  4     Pain Location  Neck    Pain Orientation  Left;Right    Pain Descriptors / Indicators  Tightness    Pain Type  Chronic pain    Pain  Onset  More than a month ago    Pain Frequency  Constant    Aggravating Factors   too much of any one thing    Pain Relieving Factors  CBD cream, ice, heat, rest         [facility] PT Assessment - 07/14/19 0001      Assessment   Medical Diagnosis  chronic migraine without aura, cervical myofascial pain syndrome    Referring Provider (PT)  Sarina Ill, MD    Onset Date/Surgical Date  11/10/18    Hand Dominance  Right      Prior Function   Level of Independence  Independent      Observation/Other Assessments   Focus on Therapeutic Outcomes (FOTO)   41% limitation      AROM   Cervical Flexion  70    Cervical Extension  50    Cervical - Right Side Bend  45    Cervical - Left Side Bend  45    Cervical - Right Rotation  50    Cervical - Left Rotation  60  [facility] Adult PT Treatment/Exercise - 07/14/19 0001      Manual Therapy   Manual Therapy  Soft tissue mobilization;Myofascial release    Manual therapy comments  thoracic and cervical musculature and PA mobs in thoracic spine       Trigger Point Dry Needling - 07/14/19 0001    Consent Given?  Yes    Muscles Treated Upper Quadrant  Rhomboids;Subscapularis    Other Dry Needling  thoracic multifidi    Upper Trapezius Response  Twitch reponse elicited;Palpable increased muscle length    Oblique Capitus Response  Twitch response elicited;Palpable increased muscle length    Cervical multifidi Response  Twitch reponse elicited;Palpable increased muscle length    Rhomboids Response  Palpable increased muscle length;Twitch response elicited    Subscapularis Response  Twitch response elicited;Palpable increased muscle length             PT Short Term Goals - 06/03/19 0930      PT SHORT TERM GOAL #3   Title  demonstrate bil cervical rotation A/ROM to > or = to 60 degrees to improve safety with driving    Baseline  Rt 50, Lt 65    Time  4    Period  Weeks    Status  On-going        PT Long  Term Goals - 07/14/19 1019      PT LONG TERM GOAL #1   Title  be independent in advanced HEP    Time  6    Period  Weeks    Status  On-going    Target Date  08/25/19      PT LONG TERM GOAL #2   Title  reduce FOTO to < or = to 44% limitation    Baseline  41% limitation    Time  6    Period  Weeks    Status  On-going    Target Date  08/25/19      PT LONG TERM GOAL #3   Title  demonstrate bil cervical A/ROM rotation to > or = to 75 degrees to improve safety with driving    Baseline  all other ROM is full, rotation limited to 50-60 degrees due to lapse in treatment.    Time  6    Period  Weeks    Status  On-going      PT LONG TERM GOAL #4   Title  report < or =to  3/10 neck pain with ADLs and self-care    Baseline  in the past week 2-7/10    Time  6    Period  Weeks    Status  On-going    Target Date  08/25/19            Plan - 07/14/19 1034    Clinical Impression Statement  Pt with lapse in treatment since 06/23/19.  Pt reports 70% overall improvement in symptoms since the start of care. Tension headaches have resolved.  Pt is using CBD cream for pain management for muscular pain.  Pain ranges 2-7/10 throughout the day and is worse with endurance tasks. Cervical A/ROM is limited and all other A/ROM is full. Pt with tension in bil cervical paraspinals, rhomboids and upper traps and demonstrated improved mobility after manual and dry needling today.  Pt is independent in HEP for endurance and flexibility and benefit from PT for manual therapy to address mobility.    PT Frequency  1x / week    PT Duration  6  weeks    PT Treatment/Interventions  ADLs/Self Care Home Management;Cryotherapy;Electrical Stimulation;Ultrasound;Traction;Moist Heat;Therapeutic activities;Therapeutic exercise;Patient/family education;Neuromuscular re-education;Manual techniques;Passive range of motion;Dry needling;Spinal Manipulations;Taping;Joint Manipulations    PT Next Visit Plan  Pt plans to buy foam  roll soon and PT will instruct in Melt.  Dry needling and manual    PT Home Exercise Plan  review HEP issued at previous PT    Recommended Other Services  recert sent 07/14/19    Consulted and Agree with Plan of Care  Patient       Patient will benefit from skilled therapeutic intervention in order to improve the following deficits and impairments:  Decreased activity tolerance, Decreased strength, Decreased endurance, Decreased range of motion, Increased muscle spasms, Impaired flexibility, Impaired UE functional use, Postural dysfunction, Pain  Visit Diagnosis: Other headache syndrome - Plan: PT plan of care cert/re-cert  Cervicalgia - Plan: PT plan of care cert/re-cert  Cramp and spasm - Plan: PT plan of care cert/re-cert     Problem List Patient Active Problem List   Diagnosis Date Noted  . Chronic migraine without aura, with intractable migraine, so stated, with status migrainosus 04/30/2019  . Laryngopharyngeal reflux (LPR) 10/30/2017  . Lung nodule 10/30/2017  . Right foot pain 11/04/2012  . Migraine with aura 02/06/2011  . CONSTIPATION, CHRONIC 07/11/2007  . 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 NAUSEA WITH VOMITING 06/13/2007  . 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 RESTLESS LEG SYNDROME 04/02/2007  . INSOMNIA 04/02/2007     [name], PT 07/14/19 11:01 AM  [facility] [address] [place], [place], [address] Phone: [phone]   Fax:  [phone]  Name: [name] [name] MRN: [id number] Date of Birth: 31-May-1966

## 2019-07-22 ENCOUNTER — Other Ambulatory Visit: Payer: Self-pay

## 2019-07-22 ENCOUNTER — Ambulatory Visit: Payer: BC Managed Care – PPO

## 2019-07-22 DIAGNOSIS — R252 Cramp and spasm: Secondary | ICD-10-CM

## 2019-07-22 DIAGNOSIS — M542 Cervicalgia: Secondary | ICD-10-CM | POA: Diagnosis not present

## 2019-07-22 DIAGNOSIS — G4489 Other headache syndrome: Secondary | ICD-10-CM

## 2019-07-22 NOTE — Therapy (Signed)
[facility] [address] [place], [place], [address] Phone: [phone]   Fax:  [phone]  Physical Therapy Treatment  Patient Details  Name: [name] [name] MRN: [id number] Date of Birth: 1966/05/13 Referring Provider (PT): Naomie Dean, MD   Encounter Date: 07/22/2019  PT End of Session - 07/22/19 1010    Visit Number  12    Date for PT Re-Evaluation  08/19/19    Authorization Type  Medicare/BCBS    PT Start Time  0931   dry needling   PT Stop Time  1010    PT Time Calculation (min)  39 min    Activity Tolerance  Patient tolerated treatment well    Behavior During Therapy  [facility] for tasks assessed/performed       Past Medical History:  Diagnosis Date  . Anxiety   . Brain fog   . Chronic pain    COMPLEX REGIONAL PAIN SYNDROME  . Hemorrhoids   . History of neck problems    dry needling being done & PT  . Leg cramping 2020   right, says sometimes she has a hard time putting pressure on that leg   . Migraine headache     Past Surgical History:  Procedure Laterality Date  . CHOLECYSTECTOMY    . DILATION AND CURETTAGE OF UTERUS    . ESOPHAGEAL MANOMETRY N/A 05/05/2018   Procedure: ESOPHAGEAL MANOMETRY (EM);  Surgeon: Vida Rigger, MD;  Location: [facility];  Service: Endoscopy;  Laterality: N/A;  . SHOULDER ARTHROSCOPY Left 2008  . THORACOSCOPY    . TONSILLECTOMY AND ADENOIDECTOMY     AT AGE 39  . WISDOM TOOTH EXTRACTION      There were no vitals filed for this visit.  Subjective Assessment - 07/22/19 0936    Subjective  My right side isn't feeling as good.  I haven't been taking the CBD oil and maybe that is why.    Currently in Pain?  Yes    Pain Score  7    Rt side   Pain Location  Neck    Pain Orientation  Right    Pain Descriptors / Indicators  Tightness    Pain Onset  More than a month ago    Pain Frequency  Constant    Aggravating Factors   too much of any one thing,  It just hurts     Pain Relieving Factors  CBD, cream, ice, heat, rest                       [facility] Adult PT Treatment/Exercise - 07/22/19 0001      Manual Therapy   Manual Therapy  Soft tissue mobilization;Myofascial release    Manual therapy comments  thoracic and cervical musculature and PA mobs in thoracic spine       Trigger Point Dry Needling - 07/22/19 0001    Consent Given?  Yes    Muscles Treated Head and Neck  Upper trapezius;Oblique capitus;Suboccipitals;Cervical multifidi    Muscles Treated Upper Quadrant  Rhomboids;Subscapularis    Upper Trapezius Response  Twitch reponse elicited;Palpable increased muscle length    Oblique Capitus Response  Twitch response elicited;Palpable increased muscle length    Cervical multifidi Response  Twitch reponse elicited;Palpable increased muscle length             PT Short Term Goals - 06/03/19 0930      PT SHORT TERM GOAL #3   Title  demonstrate bil  cervical rotation A/ROM to > or = to 60 degrees to improve safety with driving    Baseline  Rt 50, Lt 65    Time  4    Period  Weeks    Status  On-going        PT Long Term Goals - 07/14/19 1019      PT LONG TERM GOAL #1   Title  be independent in advanced HEP    Time  6    Period  Weeks    Status  On-going    Target Date  08/25/19      PT LONG TERM GOAL #2   Title  reduce FOTO to < or = to 44% limitation    Baseline  41% limitation    Time  6    Period  Weeks    Status  On-going    Target Date  08/25/19      PT LONG TERM GOAL #3   Title  demonstrate bil cervical A/ROM rotation to > or = to 75 degrees to improve safety with driving    Baseline  all other ROM is full, rotation limited to 50-60 degrees due to lapse in treatment.    Time  6    Period  Weeks    Status  On-going      PT LONG TERM GOAL #4   Title  report < or =to  3/10 neck pain with ADLs and self-care    Baseline  in the past week 2-7/10    Time  6    Period  Weeks    Status  On-going    Target  Date  08/25/19            Plan - 07/22/19 0941    Clinical Impression Statement  Pt has had a flare-up with week with unknown cause.  Pt with increased Rt sided symptoms and headache x 2-3 days.  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  Pt with tension and trigger points in Rt>Lt upper traps and cervical paraspinals.  Pt demonstrated improved tissue mobility and reduced pain and tension after manual therapy today.  Pt reported resolution of headaches and reduced pain level after session.  Pt will continue to benefit from skilled PT to address chronic pain and headache syndrome.    PT Frequency  1x / week    PT Duration  6 weeks    PT Treatment/Interventions  ADLs/Self Care Home Management;Cryotherapy;Electrical Stimulation;Ultrasound;Traction;Moist Heat;Therapeutic activities;Therapeutic exercise;Patient/family education;Neuromuscular re-education;Manual techniques;Passive range of motion;Dry needling;Spinal Manipulations;Taping;Joint Manipulations    PT Next Visit Plan  ordering foam roll today-instruct in Melt method when she gets it       Patient will benefit from skilled therapeutic intervention in order to improve the following deficits and impairments:  Decreased activity tolerance, Decreased strength, Decreased endurance, Decreased range of motion, Increased muscle spasms, Impaired flexibility, Impaired UE functional use, Postural dysfunction, Pain  Visit Diagnosis: Other headache syndrome  Cramp and spasm  Cervicalgia     Problem List Patient Active Problem List   Diagnosis Date Noted  . Chronic migraine without aura, with intractable migraine, so stated, with status migrainosus 04/30/2019  . Laryngopharyngeal reflux (LPR) 10/30/2017  . Lung nodule 10/30/2017  . Right foot pain 11/04/2012  . Migraine with aura 02/06/2011  . CONSTIPATION, CHRONIC 07/11/2007  . DYSPHAGIA, PHARYNGOESOPHAGEAL PHASE  07/11/2007  . NAUSEA WITH VOMITING 06/13/2007  . PULMONARY  SARCOIDOSIS 04/02/2007  . RESTLESS LEG SYNDROME 04/02/2007  . INSOMNIA 04/02/2007     Sigurd Sos, PT 07/22/19 10:16 AM  [facility] [address], [place], [place], [address] Phone: [phone]   Fax:  [phone]  Name: [name] [name] MRN: [id number] Date of Birth: 04/12/1966

## 2019-08-03 ENCOUNTER — Encounter: Payer: Self-pay | Admitting: Neurology

## 2019-08-03 ENCOUNTER — Ambulatory Visit (INDEPENDENT_AMBULATORY_CARE_PROVIDER_SITE_OTHER): Payer: Medicare Other | Admitting: Neurology

## 2019-08-03 ENCOUNTER — Telehealth: Payer: Self-pay | Admitting: Neurology

## 2019-08-03 ENCOUNTER — Ambulatory Visit: Payer: BC Managed Care – PPO

## 2019-08-03 ENCOUNTER — Other Ambulatory Visit: Payer: Self-pay

## 2019-08-03 VITALS — BP 114/81 | HR 76 | Temp 97.8°F | Ht 61.0 in | Wt 107.0 lb

## 2019-08-03 DIAGNOSIS — M542 Cervicalgia: Secondary | ICD-10-CM

## 2019-08-03 DIAGNOSIS — G43711 Chronic migraine without aura, intractable, with status migrainosus: Secondary | ICD-10-CM

## 2019-08-03 DIAGNOSIS — G4489 Other headache syndrome: Secondary | ICD-10-CM

## 2019-08-03 DIAGNOSIS — R252 Cramp and spasm: Secondary | ICD-10-CM | POA: Diagnosis not present

## 2019-08-03 DIAGNOSIS — M7918 Myalgia, other site: Secondary | ICD-10-CM | POA: Diagnosis not present

## 2019-08-03 MED ORDER — NURTEC 75 MG PO TBDP: 75.0000 mg | ORAL_TABLET | Freq: Every day | ORAL | 0 refills | Status: DC | PRN

## 2019-08-03 MED ORDER — RIZATRIPTAN BENZOATE 10 MG PO TBDP: 10.0000 mg | ORAL_TABLET | ORAL | 11 refills | Status: AC | PRN

## 2019-08-03 MED ORDER — AMITRIPTYLINE HCL 25 MG PO TABS: 25.0000 mg | ORAL_TABLET | Freq: Every day | ORAL | 3 refills | Status: DC

## 2019-08-03 MED ORDER — TIZANIDINE HCL 4 MG PO TABS: 4.0000 mg | ORAL_TABLET | Freq: Three times a day (TID) | ORAL | 6 refills | Status: DC | PRN

## 2019-08-03 NOTE — Therapy (Signed)
[facility] [address] [place], [place], [address] Phone: [phone]   Fax:  [phone]  Physical Therapy Treatment  Patient Details  Name: [name] [name] MRN: [id number] Date of Birth: [birth year] Referring Provider ([name] [name]): Naomie Dean, MD   Encounter Date: 08/03/2019  [name] [name] End of Session - 08/03/19 1225    Visit Number  13    Date for [name] [name] Re-Evaluation  08/19/19    Authorization Type  Medicare/BCBS    [name] [name] Start Time  1149   dry needling   [name] [name] Stop Time  1227    [name] [name] Time Calculation (min)  38 min    Activity Tolerance  Patient tolerated treatment well    Behavior During Therapy  [facility] for tasks assessed/performed       Past Medical History:  Diagnosis Date  . Anxiety   . Brain fog   . Chronic pain    COMPLEX REGIONAL PAIN SYNDROME  . Hemorrhoids   . History of neck problems    dry needling being done & [name] [name]  . Leg cramping 2020   right, says sometimes she has a hard time putting pressure on that leg   . Migraine headache     Past Surgical History:  Procedure Laterality Date  . CHOLECYSTECTOMY    . DILATION AND CURETTAGE OF UTERUS    . ESOPHAGEAL MANOMETRY N/A 05/05/2018   Procedure: ESOPHAGEAL MANOMETRY (EM);  Surgeon: Vida Rigger, MD;  Location: [facility];  Service: Endoscopy;  Laterality: N/A;  . SHOULDER ARTHROSCOPY Left 2008  . THORACOSCOPY    . TONSILLECTOMY AND ADENOIDECTOMY     AT AGE 80  . WISDOM TOOTH EXTRACTION      There were no vitals filed for this visit.  Subjective Assessment - 08/03/19 1151    Subjective  I saw Dr Lucia Gaskins and she is going to try Botox.    Currently in Pain?  Yes    Pain Score  8    8/10 Rt and 2/10 Lt   Pain Location  Neck    Pain Orientation  Right;Left    Pain Descriptors / Indicators  Aching    Pain Type  Chronic pain    Pain Onset  More than a month ago    Pain Frequency  Constant    Aggravating Factors   everything (cooking, laundry, dishes)    Pain  Relieving Factors  CBD cream, ice, heat, rest                       [facility] [name] [name] Treatment/Exercise - 08/03/19 0001      Manual Therapy   Manual Therapy  Soft tissue mobilization;Myofascial release    Manual therapy comments  thoracic and cervical musculature and PA mobs in thoracic spine       Trigger Point Dry Needling - 08/03/19 0001    Consent Given?  Yes    Muscles Treated Head and Neck  Upper trapezius;Oblique capitus;Suboccipitals;Cervical multifidi    Muscles Treated Upper Quadrant  Rhomboids;Subscapularis    Upper Trapezius Response  Twitch reponse elicited;Palpable increased muscle length    Oblique Capitus Response  Twitch response elicited;Palpable increased muscle length    Cervical multifidi Response  Twitch reponse elicited;Palpable increased muscle length             [name] [name] Short Term Goals - 06/03/19 0930      [name] [name] SHORT TERM GOAL #3   Title  demonstrate bil cervical rotation A/ROM to >  or = to 60 degrees to improve safety with driving    Baseline  Rt 50, Lt 65    Time  4    Period  Weeks    Status  On-going        [name] [name] Long Term Goals - 07/14/19 1019      [name] [name] LONG TERM GOAL #1   Title  be independent in advanced HEP    Time  6    Period  Weeks    Status  On-going    Target Date  08/25/19      [name] [name] LONG TERM GOAL #2   Title  reduce FOTO to < or = to 44% limitation    Baseline  41% limitation    Time  6    Period  Weeks    Status  On-going    Target Date  08/25/19      [name] [name] LONG TERM GOAL #3   Title  demonstrate bil cervical A/ROM rotation to > or = to 75 degrees to improve safety with driving    Baseline  all other ROM is full, rotation limited to 50-60 degrees due to lapse in treatment.    Time  6    Period  Weeks    Status  On-going      [name] [name] LONG TERM GOAL #4   Title  report < or =to  3/10 neck pain with ADLs and self-care    Baseline  in the past week 2-7/10    Time  6    Period  Weeks    Status  On-going    Target Date   08/25/19            Plan - 08/03/19 1200    Clinical Impression Statement  [name] [name] arrived today with 8/10 Rt sided neck pain.  [name] [name] saw neurologist today and she is going to try Botex and MD adjusted her medications.  [name] [name] has a foam roll and would like to spend a session with education on how to use. Today, her pain level is too high to do this.  [name] [name] with significant tension in the Rt upper traps and rhomboids and demonstrated improved tissue mobility and reduced pain after treatment.  [name] [name] educated [name] [name] on use of Curable app and neuroscience of pain during manual therapy today.  [name] [name] will continue to benefit from skilled [name] [name] to address chronic pain syndrome.    [name] [name] Frequency  1x / week    [name] [name] Duration  6 weeks    [name] [name] Treatment/Interventions  ADLs/Self Care Home Management;Cryotherapy;Electrical Stimulation;Ultrasound;Traction;Moist Heat;Therapeutic activities;Therapeutic exercise;Patient/family education;Neuromuscular re-education;Manual techniques;Passive range of motion;Dry needling;Spinal Manipulations;Taping;Joint Manipulations    [name] [name] Next Visit Plan  instruct in foam roll exercises, manual therapy as needed to address tension    Consulted and Agree with Plan of Care  Patient       Patient will benefit from skilled therapeutic intervention in order to improve the following deficits and impairments:  Decreased activity tolerance, Decreased strength, Decreased endurance, Decreased range of motion, Increased muscle spasms, Impaired flexibility, Impaired UE functional use, Postural dysfunction, Pain  Visit Diagnosis: Other headache syndrome  Cramp and spasm  Cervicalgia     Problem List Patient Active Problem List   Diagnosis Date Noted  . Chronic migraine without aura, with intractable migraine, so stated, with status migrainosus 04/30/2019  . Laryngopharyngeal reflux (LPR) 10/30/2017  . Lung nodule 10/30/2017  . Right foot pain 11/04/2012  . Migraine with aura 02/06/2011  . CONSTIPATION,  CHRONIC 07/11/2007  .  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 NAUSEA WITH VOMITING 06/13/2007  . 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 RESTLESS LEG SYNDROME 04/02/2007  . INSOMNIA 04/02/2007    [name] [name], [name] [name] 08/03/19 12:26 PM  [facility] [address], [address] [place], [place], [address] Phone: [phone]   Fax:  [phone]  Name: [name] [name] MRN: [id number] Date of Birth: May 30, 1966

## 2019-08-03 NOTE — Telephone Encounter (Signed)
Botox charge sheet completed. Pending MD signature. Will need pt consent at first botox visit.

## 2019-08-03 NOTE — Patient Instructions (Addendum)
tizanidine 1 pill 3x a day may increase to 2 pills as needed and tolerates Amitriptyline at bedtime, take 2 hours prior to bedtime,Adjust the other sleep meds as necessary, may need less of those Stop Ajovy Botox for migraine, we will call At onset of migraine take rizatriptan: Please take 1 tablet at the onset of your headache. If it does not improve the symptoms please take one additional tablet. Do not take more then 2 tablets in 24hrs. Do not take use more then 2 to 3 times in a week. Nurtec: Daily as needed for migraine  Rimegepant oral dissolving tablet What is this medicine? RIMEGEPANT (ri ME je pant) is used to treat migraine headaches with or without aura. An aura is a strange feeling or visual disturbance that warns you of an attack. It is not used to prevent migraines. This medicine may be used for other purposes; ask your health care provider or pharmacist if you have questions. COMMON BRAND NAME(S): NURTEC ODT What should I tell my health care provider before I take this medicine? They need to know if you have any of these conditions:  kidney disease  liver disease  an unusual or allergic reaction to rimegepant, other medicines, foods, dyes, or preservatives  pregnant or trying to get pregnant  breast-feeding How should I use this medicine? Take the medicine by mouth. Follow the directions on the prescription label. Leave the tablet in the sealed blister pack until you are ready to take it. With dry hands, open the blister and gently remove the tablet. If the tablet breaks or crumbles, throw it away and take a new tablet out of the blister pack. Place the tablet in the mouth and allow it to dissolve, and then swallow. Do not cut, crush, or chew this medicine. You do not need water to take this medicine. Talk to your pediatrician about the use of this medicine in children. Special care may be needed. Overdosage: If you think you have taken too much of this medicine contact a  poison control center or emergency room at once. NOTE: This medicine is only for you. Do not share this medicine with others. What if I miss a dose? This does not apply. This medicine is not for regular use. What may interact with this medicine? This medicine may interact with the following medications:  certain medicines for fungal infections like fluconazole, itraconazole  rifampin This list may not describe all possible interactions. Give your health care provider a list of all the medicines, herbs, non-prescription drugs, or dietary supplements you use. Also tell them if you smoke, drink alcohol, or use illegal drugs. Some items may interact with your medicine. What should I watch for while using this medicine? Visit your health care professional for regular checks on your progress. Tell your health care professional if your symptoms do not start to get better or if they get worse. What side effects may I notice from receiving this medicine? Side effects that you should report to your doctor or health care professional as soon as possible:  allergic reactions like skin rash, itching or hives; swelling of the face, lips, or tongue Side effects that usually do not require medical attention (report these to your doctor or health care professional if they continue or are bothersome):  nausea This list may not describe all possible side effects. Call your doctor for medical advice about side effects. You may report side effects to FDA at 1-800-FDA-1088. Where should I keep my medicine? Keep  out of the reach of children. Store at room temperature between 15 and 30 degrees C (59 and 86 degrees F). Throw away any unused medicine after the expiration date. NOTE: This sheet is a summary. It may not cover all possible information. If you have questions about this medicine, talk to your doctor, pharmacist, or health care provider.  2020 Elsevier/Gold Standard (2018-08-11 00:21:31) Rizatriptan  tablets What is this medicine? RIZATRIPTAN (rye za TRIP tan) is used to treat migraines with or without aura. An aura is a strange feeling or visual disturbance that warns you of an attack. It is not used to prevent migraines. This medicine may be used for other purposes; ask your health care provider or pharmacist if you have questions. COMMON BRAND NAME(S): Maxalt What should I tell my health care provider before I take this medicine? They need to know if you have any of these conditions:  cigarette smoker  circulation problems in fingers and toes  diabetes  heart disease  high blood pressure  high cholesterol  history of irregular heartbeat  history of stroke  kidney disease  liver disease  stomach or intestine problems  an unusual or allergic reaction to rizatriptan, other medicines, foods, dyes, or preservatives  pregnant or trying to get pregnant  breast-feeding How should I use this medicine? Take this medicine by mouth with a glass of water. Follow the directions on the prescription label. Do not take it more often than directed. Talk to your pediatrician regarding the use of this medicine in children. While this drug may be prescribed for children as young as 6 years for selected conditions, precautions do apply. Overdosage: If you think you have taken too much of this medicine contact a poison control center or emergency room at once. NOTE: This medicine is only for you. Do not share this medicine with others. What if I miss a dose? This does not apply. This medicine is not for regular use. What may interact with this medicine? Do not take this medicine with any of the following medicines:  certain medicines for migraine headache like almotriptan, eletriptan, frovatriptan, naratriptan, rizatriptan, sumatriptan, zolmitriptan  ergot alkaloids like dihydroergotamine, ergonovine, ergotamine, methylergonovine  MAOIs like Carbex, Eldepryl, Marplan, Nardil, and  Parnate This medicine may also interact with the following medications:  certain medicines for depression, anxiety, or psychotic disorders  propranolol This list may not describe all possible interactions. Give your health care provider a list of all the medicines, herbs, non-prescription drugs, or dietary supplements you use. Also tell them if you smoke, drink alcohol, or use illegal drugs. Some items may interact with your medicine. What should I watch for while using this medicine? Visit your healthcare professional for regular checks on your progress. Tell your healthcare professional if your symptoms do not start to get better or if they get worse. You may get drowsy or dizzy. Do not drive, use machinery, or do anything that needs mental alertness until you know how this medicine affects you. Do not stand up or sit up quickly, especially if you are an older patient. This reduces the risk of dizzy or fainting spells. Alcohol may interfere with the effect of this medicine. Your mouth may get dry. Chewing sugarless gum or sucking hard candy and drinking plenty of water may help. Contact your healthcare professional if the problem does not go away or is severe. If you take migraine medicines for 10 or more days a month, your migraines may get worse. Keep a diary of  headache days and medicine use. Contact your healthcare professional if your migraine attacks occur more frequently. What side effects may I notice from receiving this medicine? Side effects that you should report to your doctor or health care professional as soon as possible:  allergic reactions like skin rash, itching or hives, swelling of the face, lips, or tongue  chest pain or chest tightness  signs and symptoms of a dangerous change in heartbeat or heart rhythm like chest pain; dizziness; fast, irregular heartbeat; palpitations; feeling faint or lightheaded; falls; breathing problems  signs and symptoms of a stroke like changes  in vision; confusion; trouble speaking or understanding; severe headaches; sudden numbness or weakness of the face, arm or leg; trouble walking; dizziness; loss of balance or coordination  signs and symptoms of serotonin syndrome like irritable; confusion; diarrhea; fast or irregular heartbeat; muscle twitching; stiff muscles; trouble walking; sweating; high fever; seizures; chills; vomiting Side effects that usually do not require medical attention (report to your doctor or health care professional if they continue or are bothersome):  diarrhea  dizziness  drowsiness  dry mouth  headache  nausea, vomiting  pain, tingling, numbness in the hands or feet  stomach pain This list may not describe all possible side effects. Call your doctor for medical advice about side effects. You may report side effects to FDA at 1-800-FDA-1088. Where should I keep my medicine? Keep out of the reach of children. Store at room temperature between 15 and 30 degrees C (59 and 86 degrees F). Keep container tightly closed. Throw away any unused medicine after the expiration date. NOTE: This sheet is a summary. It may not cover all possible information. If you have questions about this medicine, talk to your doctor, pharmacist, or health care provider.  2020 Elsevier/Gold Standard (2017-12-10 14:59:59) OnabotulinumtoxinA injection (Medical Use) What is this medicine? ONABOTULINUMTOXINA (o na BOTT you lye num tox in eh) is a neuro-muscular blocker. This medicine is used to treat crossed eyes, eyelid spasms, severe neck muscle spasms, ankle and toe muscle spasms, and elbow, wrist, and finger muscle spasms. It is also used to treat excessive underarm sweating, to prevent chronic migraine headaches, and to treat loss of bladder control due to neurologic conditions such as multiple sclerosis or spinal cord injury. This medicine may be used for other purposes; ask your health care provider or pharmacist if you have  questions. COMMON BRAND NAME(S): Botox What should I tell my health care provider before I take this medicine? They need to know if you have any of these conditions:  breathing problems  cerebral palsy spasms  difficulty urinating  heart problems  history of surgery where this medicine is going to be used  infection at the site where this medicine is going to be used  myasthenia gravis or other neurologic disease  nerve or muscle disease  surgery plans  take medicines that treat or prevent blood clots  thyroid problems  an unusual or allergic reaction to botulinum toxin, albumin, other medicines, foods, dyes, or preservatives  pregnant or trying to get pregnant  breast-feeding How should I use this medicine? This medicine is for injection into a muscle. It is given by a health care professional in a hospital or clinic setting. Talk to your pediatrician regarding the use of this medicine in children. While this drug may be prescribed for children as young as 72 years old for selected conditions, precautions do apply. Overdosage: If you think you have taken too much of this medicine  contact a poison control center or emergency room at once. NOTE: This medicine is only for you. Do not share this medicine with others. What if I miss a dose? This does not apply. What may interact with this medicine?  aminoglycoside antibiotics like gentamicin, neomycin, tobramycin  muscle relaxants  other botulinum toxin injections This list may not describe all possible interactions. Give your health care provider a list of all the medicines, herbs, non-prescription drugs, or dietary supplements you use. Also tell them if you smoke, drink alcohol, or use illegal drugs. Some items may interact with your medicine. What should I watch for while using this medicine? Visit your doctor for regular check ups. This medicine will cause weakness in the muscle where it is injected. Tell your doctor  if you feel unusually weak in other muscles. Get medical help right away if you have problems with breathing, swallowing, or talking. This medicine might make your eyelids droop or make you see blurry or double. If you have weak muscles or trouble seeing do not drive a car, use machinery, or do other dangerous activities. This medicine contains albumin from human blood. It may be possible to pass an infection in this medicine, but no cases have been reported. Talk to your doctor about the risks and benefits of this medicine. If your activities have been limited by your condition, go back to your regular routine slowly after treatment with this medicine. What side effects may I notice from receiving this medicine? Side effects that you should report to your doctor or health care professional as soon as possible:  allergic reactions like skin rash, itching or hives, swelling of the face, lips, or tongue  breathing problems  changes in vision  chest pain or tightness  eye irritation, pain  fast, irregular heartbeat  infection  numbness  speech problems  swallowing problems  unusual weakness Side effects that usually do not require medical attention (report to your doctor or health care professional if they continue or are bothersome):  bruising or pain at site where injected  drooping eyelid  dry eyes or mouth  headache  muscles aches, pains  sensitivity to light  tearing This list may not describe all possible side effects. Call your doctor for medical advice about side effects. You may report side effects to FDA at 1-800-FDA-1088. Where should I keep my medicine? This drug is given in a hospital or clinic and will not be stored at home. NOTE: This sheet is a summary. It may not cover all possible information. If you have questions about this medicine, talk to your doctor, pharmacist, or health care provider.  2020 Elsevier/Gold Standard (2017-12-02  14:21:42) Amitriptyline tablets What is this medicine? AMITRIPTYLINE (a mee TRIP ti leen) is used to treat depression. This medicine may be used for other purposes; ask your health care provider or pharmacist if you have questions. COMMON BRAND NAME(S): Elavil, Vanatrip What should I tell my health care provider before I take this medicine? They need to know if you have any of these conditions:  an alcohol problem  asthma, difficulty breathing  bipolar disorder or schizophrenia  difficulty passing urine, prostate trouble  glaucoma  heart disease or previous heart attack  liver disease  over active thyroid  seizures  thoughts or plans of suicide, a previous suicide attempt, or family history of suicide attempt  an unusual or allergic reaction to amitriptyline, other medicines, foods, dyes, or preservatives  pregnant or trying to get pregnant  breast-feeding How  should I use this medicine? Take this medicine by mouth with a drink of water. Follow the directions on the prescription label. You can take the tablets with or without food. Take your medicine at regular intervals. Do not take it more often than directed. Do not stop taking this medicine suddenly except upon the advice of your doctor. Stopping this medicine too quickly may cause serious side effects or your condition may worsen. A special MedGuide will be given to you by the pharmacist with each prescription and refill. Be sure to read this information carefully each time. Talk to your pediatrician regarding the use of this medicine in children. Special care may be needed. Overdosage: If you think you have taken too much of this medicine contact a poison control center or emergency room at once. NOTE: This medicine is only for you. Do not share this medicine with others. What if I miss a dose? If you miss a dose, take it as soon as you can. If it is almost time for your next dose, take only that dose. Do not take  double or extra doses. What may interact with this medicine? Do not take this medicine with any of the following medications:  arsenic trioxide  certain medicines used to regulate abnormal heartbeat or to treat other heart conditions  cisapride  droperidol  halofantrine  linezolid  MAOIs like Carbex, Eldepryl, Marplan, Nardil, and Parnate  methylene blue  other medicines for mental depression  phenothiazines like perphenazine, thioridazine and chlorpromazine  pimozide  probucol  procarbazine  sparfloxacin  St. John's Wort This medicine may also interact with the following medications:  atropine and related drugs like hyoscyamine, scopolamine, tolterodine and others  barbiturate medicines for inducing sleep or treating seizures, like phenobarbital  cimetidine  disulfiram  ethchlorvynol  thyroid hormones such as levothyroxine  ziprasidone This list may not describe all possible interactions. Give your health care provider a list of all the medicines, herbs, non-prescription drugs, or dietary supplements you use. Also tell them if you smoke, drink alcohol, or use illegal drugs. Some items may interact with your medicine. What should I watch for while using this medicine? Tell your doctor if your symptoms do not get better or if they get worse. Visit your doctor or health care professional for regular checks on your progress. Because it may take several weeks to see the full effects of this medicine, it is important to continue your treatment as prescribed by your doctor. Patients and their families should watch out for new or worsening thoughts of suicide or depression. Also watch out for sudden changes in feelings such as feeling anxious, agitated, panicky, irritable, hostile, aggressive, impulsive, severely restless, overly excited and hyperactive, or not being able to sleep. If this happens, especially at the beginning of treatment or after a change in dose, call  your health care professional. Bonita QuinYou may get drowsy or dizzy. Do not drive, use machinery, or do anything that needs mental alertness until you know how this medicine affects you. Do not stand or sit up quickly, especially if you are an older patient. This reduces the risk of dizzy or fainting spells. Alcohol may interfere with the effect of this medicine. Avoid alcoholic drinks. Do not treat yourself for coughs, colds, or allergies without asking your doctor or health care professional for advice. Some ingredients can increase possible side effects. Your mouth may get dry. Chewing sugarless gum or sucking hard candy, and drinking plenty of water will help. Contact your doctor  if the problem does not go away or is severe. This medicine may cause dry eyes and blurred vision. If you wear contact lenses you may feel some discomfort. Lubricating drops may help. See your eye doctor if the problem does not go away or is severe. This medicine can cause constipation. Try to have a bowel movement at least every 2 to 3 days. If you do not have a bowel movement for 3 days, call your doctor or health care professional. This medicine can make you more sensitive to the sun. Keep out of the sun. If you cannot avoid being in the sun, wear protective clothing and use sunscreen. Do not use sun lamps or tanning beds/booths. What side effects may I notice from receiving this medicine? Side effects that you should report to your doctor or health care professional as soon as possible:  allergic reactions like skin rash, itching or hives, swelling of the face, lips, or tongue  anxious  breathing problems  changes in vision  confusion  elevated mood, decreased need for sleep, racing thoughts, impulsive behavior  eye pain  fast, irregular heartbeat  feeling faint or lightheaded, falls  feeling agitated, angry, or irritable  fever with increased sweating  hallucination, loss of contact with  reality  seizures  stiff muscles  suicidal thoughts or other mood changes  tingling, pain, or numbness in the feet or hands  trouble passing urine or change in the amount of urine  trouble sleeping  unusually weak or tired  vomiting  yellowing of the eyes or skin Side effects that usually do not require medical attention (report to your doctor or health care professional if they continue or are bothersome):  change in sex drive or performance  change in appetite or weight  constipation  dizziness  dry mouth  nausea  tired  tremors  upset stomach This list may not describe all possible side effects. Call your doctor for medical advice about side effects. You may report side effects to FDA at 1-800-FDA-1088. Where should I keep my medicine? Keep out of the reach of children. Store at room temperature between 20 and 25 degrees C (68 and 77 degrees F). Throw away any unused medicine after the expiration date. NOTE: This sheet is a summary. It may not cover all possible information. If you have questions about this medicine, talk to your doctor, pharmacist, or health care provider.  2020 Elsevier/Gold Standard (2018-05-20 13:04:32) Tizanidine tablets or capsules What is this medicine? TIZANIDINE (tye ZAN i deen) helps to relieve muscle spasms. It may be used to help in the treatment of multiple sclerosis and spinal cord injury. This medicine may be used for other purposes; ask your health care provider or pharmacist if you have questions. COMMON BRAND NAME(S): Zanaflex What should I tell my health care provider before I take this medicine? They need to know if you have any of these conditions:  kidney disease  liver disease  low blood pressure  mental disorder  an unusual or allergic reaction to tizanidine, other medicines, lactose (tablets only), foods, dyes, or preservatives  pregnant or trying to get pregnant  breast-feeding How should I use this  medicine? Take this medicine by mouth with a full glass of water. Take this medicine on an empty stomach, at least 30 minutes before or 2 hours after food. Do not take with food unless you talk with your doctor. Follow the directions on the prescription label. Take your medicine at regular intervals. Do not take  your medicine more often than directed. Do not stop taking except on your doctor's advice. Suddenly stopping the medicine can be very dangerous. Talk to your pediatrician regarding the use of this medicine in children. Patients over 14 years old may have a stronger reaction and need a smaller dose. Overdosage: If you think you have taken too much of this medicine contact a poison control center or emergency room at once. NOTE: This medicine is only for you. Do not share this medicine with others. What if I miss a dose? If you miss a dose, take it as soon as you can. If it is almost time for your next dose, take only that dose. Do not take double or extra doses. What may interact with this medicine? Do not take this medicine with any of the following medications:  ciprofloxacin  fluvoxamine  narcotic medicines for cough  thiabendazole This medicine may also interact with the following medications:  acyclovir  alcohol  antihistamines for allergy, cough, and cold  baclofen  certain medicines for anxiety or sleep  certain medicines for blood pressure, heart disease, irregular heartbeat  certain medicines for depression like amitriptyline, fluoxetine, sertraline  certain medicines for seizures like phenobarbital, primidone  certain medicines for stomach problems like cimetidine, famotidine  female hormones, like estrogens or progestins and birth control pills, patches, rings, or injections  general anesthetics like halothane, isoflurane, methoxyflurane, propofol  local anesthetics like lidocaine, pramoxine, tetracaine  medicines that relax muscles for  surgery  narcotic medicines for pain  phenothiazines like chlorpromazine, mesoridazine, prochlorperazine  ticlopidine  zileuton This list may not describe all possible interactions. Give your health care provider a list of all the medicines, herbs, non-prescription drugs, or dietary supplements you use. Also tell them if you smoke, drink alcohol, or use illegal drugs. Some items may interact with your medicine. What should I watch for while using this medicine? Tell your doctor or health care professional if your symptoms do not start to get better or if they get worse. You may get drowsy or dizzy. Do not drive, use machinery, or do anything that needs mental alertness until you know how this medicine affects you. Do not stand or sit up quickly, especially if you are an older patient. This reduces the risk of dizzy or fainting spells. Alcohol may interfere with the effect of this medicine. Avoid alcoholic drinks. If you are taking another medicine that also causes drowsiness, you may have more side effects. Give your health care provider a list of all medicines you use. Your doctor will tell you how much medicine to take. Do not take more medicine than directed. Call emergency for help if you have problems breathing or unusual sleepiness. Your mouth may get dry. Chewing sugarless gum or sucking hard candy, and drinking plenty of water may help. Contact your doctor if the problem does not go away or is severe. What side effects may I notice from receiving this medicine? Side effects that you should report to your doctor or health care professional as soon as possible:  allergic reactions like skin rash, itching or hives, swelling of the face, lips, or tongue  breathing problems  hallucinations  signs and symptoms of liver injury like dark yellow or brown urine; general ill feeling or flu-like symptoms; light-colored stools; loss of appetite; nausea; right upper quadrant belly pain; unusually  weak or tired; yellowing of the eyes or skin  signs and symptoms of low blood pressure like dizziness; feeling faint or lightheaded, falls;  unusually weak or tired  unusually slow heartbeat  unusually weak or tired Side effects that usually do not require medical attention (report to your doctor or health care professional if they continue or are bothersome):  blurred vision  constipation  dizziness  dry mouth  tiredness This list may not describe all possible side effects. Call your doctor for medical advice about side effects. You may report side effects to FDA at 1-800-FDA-1088. Where should I keep my medicine? Keep out of the reach of children. Store at room temperature between 15 and 30 degrees C (59 and 86 degrees F). Throw away any unused medicine after the expiration date. NOTE: This sheet is a summary. It may not cover all possible information. If you have questions about this medicine, talk to your doctor, pharmacist, or health care provider.  2020 Elsevier/Gold Standard (2017-03-12 13:33:29)

## 2019-08-03 NOTE — Progress Notes (Signed)
[facility]    Provider:  Dr Lucia Gaskins Requesting Provider: Wylene Simmer Adelfa Koh, MD Primary Care Provider:  Gaspar Garbe, MD  CC:  Daily Headache  Interval history 08/03/2019; Dry needling was helping and she had to miss a few appointments and she is back to where she was with tightness,  The Ajovy is not helping we are going to stop Ajovy and start Botox. STOP AJOVY a 4 month trial did not help her migraines still with daily headaches and > 15 migraine days a month, migraines are severe, no aura, no medication overuse, failed multiple medications, will start Botox. Will also try acute management medication nurtec samples and also prescribe maxalt. Her neck hurts and she has insomnia, start amitriptyline, discussed seratnin syndrome and symptoms to watch for given she takes zoloft an trazodone.  HPI:  [name] [name] is a 54 y.o. female here as requested by Tisovec, Adelfa Koh, MD for migraines. She has a history of migraines. When she hit menopause they went away. They started coming back in the last year. She has a headache that requires ice every day. She wakes up with then in the middle of the night with the worst headache. She has a lot of congestion. They started daily for over a month and prior several days a week. They are in the forehead, in the front of the head, pounding. She is using daily nasal saline. The congestion has been ongoing for a while as well. The headaches can be worse when she wakes with them. She has light sensitivity, nausea, ice and a dark room helps. She also had recent neck issues she has had dry needling for the cervical muscles. She is going to PT. She sees Ellis Savage. She has problems taking medications. She has had Complex Regional Pain Syndrome, she has a lot of sensitivities to medications. She has a history of sarcoid. Also "brain fog". Also left vision changes feels she has lost vision in the left eye. Penicillin no allergy more sensitivity  in the stomach.   Topiramate, amitriptyline/nortriptyline, botox, trokendi, trazodone, sertraline, tizanidine. Imitrex. Maxalt.   Reviewed notes, labs and imaging from outside physicians, which showed:   Review of Systems: Patient complains of symptoms per HPI as well as the following symptoms: headache Pertinent negatives and positives per HPI. All others negative.   Social History   Socioeconomic History  . Marital status: Married    Spouse name: Not on file  . Number of children: Not on file  . Years of education: Not on file  . Highest education level: Master's degree (e.g., MA, MS, MEng, MEd, MSW, MBA)  Occupational History  . Not on file  Tobacco Use  . Smoking status: Never Smoker  . Smokeless tobacco: Never Used  Substance and Sexual Activity  . Alcohol use: Not Currently    Alcohol/week: 1.0 standard drinks    Types: 1 Standard drinks or equivalent per week  . Drug use: No  . Sexual activity: Yes    Partners: Male    Birth control/protection: Pill  Other Topics Concern  . Not on file  Social History Narrative   Lives at home with husband and daughter   Right handed   Caffeine: 1 cup/day   Social Determinants of Health   Financial Resource Strain:   . Difficulty of Paying Living Expenses: Not on file  Food Insecurity:   . Worried About Programme researcher, broadcasting/film/video in the Last Year: Not on file  . Ran Out  of Food in the Last Year: Not on file  Transportation Needs:   . Lack of Transportation (Medical): Not on file  . Lack of Transportation (Non-Medical): Not on file  Physical Activity:   . Days of Exercise per Week: Not on file  . Minutes of Exercise per Session: Not on file  Stress:   . Feeling of Stress : Not on file  Social Connections:   . Frequency of Communication with Friends and Family: Not on file  . Frequency of Social Gatherings with Friends and Family: Not on file  . Attends Religious Services: Not on file  . Active Member of Clubs or Organizations:  Not on file  . Attends Banker Meetings: Not on file  . Marital Status: Not on file  Intimate Partner Violence:   . Fear of Current or Ex-Partner: Not on file  . Emotionally Abused: Not on file  . Physically Abused: Not on file  . Sexually Abused: Not on file    Family History  Problem Relation Age of Onset  . Diabetes Mother        TYPE 2  . Hypertension Mother   . Cancer Mother        UTERINE CANCER  . Heart disease Father        HEART ATTACK  . Migraines Father     Past Medical History:  Diagnosis Date  . Anxiety   . Brain fog   . Chronic pain    COMPLEX REGIONAL PAIN SYNDROME  . Hemorrhoids   . History of neck problems    dry needling being done & PT  . Leg cramping 2020   right, says sometimes she has a hard time putting pressure on that leg   . Migraine headache     Patient Active Problem List   Diagnosis Date Noted  . Chronic migraine without aura, with intractable migraine, so stated, with status migrainosus 04/30/2019  . Laryngopharyngeal reflux (LPR) 10/30/2017  . Lung nodule 10/30/2017  . Right foot pain 11/04/2012  . Migraine with aura 02/06/2011  . CONSTIPATION, CHRONIC 07/11/2007  . 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 NAUSEA WITH VOMITING 06/13/2007  . 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 RESTLESS LEG SYNDROME 04/02/2007  . INSOMNIA 04/02/2007    Past Surgical History:  Procedure Laterality Date  . CHOLECYSTECTOMY    . DILATION AND CURETTAGE OF UTERUS    . ESOPHAGEAL MANOMETRY N/A 05/05/2018   Procedure: ESOPHAGEAL MANOMETRY (EM);  Surgeon: Vida Rigger, MD;  Location: [facility];  Service: Endoscopy;  Laterality: N/A;  . SHOULDER ARTHROSCOPY Left 2008  . THORACOSCOPY    . TONSILLECTOMY AND ADENOIDECTOMY     AT AGE 84  . WISDOM TOOTH EXTRACTION      Current Outpatient Medications  Medication Sig Dispense Refill  . ALPRAZolam (XANAX) 0.5 MG tablet Take 0.5 mg by mouth at bedtime as needed for anxiety.    Marland Kitchen  amoxicillin-clavulanate (AUGMENTIN) 875-125 MG tablet Take 1 tablet by mouth 2 (two) times daily. 14 tablet 0  . Amphetamine ER (ADZENYS XR-ODT) 18.8 MG TBED Take 1 tablet by mouth daily.     Marland Kitchen estradiol-norethindrone (ACTIVELLA) 1-0.5 MG tablet Take 1 tablet by mouth daily.    . fluticasone (FLONASE) 50 MCG/ACT nasal spray Place 2 sprays into both nostrils daily.    . sertraline (ZOLOFT) 100 MG tablet Take 100 mg by mouth daily.      Marland Kitchen tiZANidine (ZANAFLEX) 4 MG tablet Take 1-2 tablets (4-8 mg total) by mouth  every 8 (eight) hours as needed. 90 tablet 6  . traZODone (DESYREL) 50 MG tablet Take 75 mg by mouth at bedtime. Takes 50-100 mg every night at bedtime.    Marland Kitchen amitriptyline (ELAVIL) 25 MG tablet Take 1 tablet (25 mg total) by mouth at bedtime. Take 2 hours before bedtime. 30 tablet 3  . Rimegepant Sulfate (NURTEC) 75 MG TBDP Take 75 mg by mouth daily as needed. For migraines. Take as close to onset of migraine as possible. One daily maximum. 4 tablet 0  . rizatriptan (MAXALT-MLT) 10 MG disintegrating tablet Take 1 tablet (10 mg total) by mouth as needed for migraine. May repeat in 2 hours if needed 9 tablet 11   No current facility-administered medications for this visit.    Allergies as of 08/03/2019 - Review Complete 08/03/2019  Allergen Reaction Noted  . Levaquin [levofloxacin in d5w]  08/30/2015  . Metoclopramide hcl    . Penicillins    . Reglan [metoclopramide] Anxiety 08/30/2015    Vitals: BP 114/81 (BP Location: Left Arm, Patient Position: Sitting)   Pulse 76   Temp 97.8 F (36.6 C) Comment: taken at front  Ht 5\' 1"  (1.549 m)   Wt 107 lb (48.5 kg)   LMP 01/05/2011   BMI 20.22 kg/m  Last Weight:  Wt Readings from Last 1 Encounters:  08/03/19 107 lb (48.5 kg)   Last Height:   Ht Readings from Last 1 Encounters:  08/03/19 5\' 1"  (1.549 m)     Physical exam: Exam: Gen: NAD, conversant, well nourised, well groomed                     CV: RRR, no MRG. No Carotid  Bruits. No peripheral edema, warm, nontender Eyes: Conjunctivae clear without exudates or hemorrhage  Neuro: Detailed Neurologic Exam  Speech:    Speech is normal; fluent and spontaneous with normal comprehension.  Cognition:    The patient is oriented to person, place, and time;     recent and remote memory intact;     language fluent;     normal attention, concentration,     fund of knowledge Cranial Nerves:    The pupils are equal, round, and reactive to light. The fundi are normal and spontaneous venous pulsations are present. Visual fields are full to finger confrontation. Extraocular movements are intact. Trigeminal sensation is intact and the muscles of mastication are normal. The face is symmetric. The palate elevates in the midline. Hearing intact. Voice is normal. Shoulder shrug is normal. The tongue has normal motion without fasciculations.   Coordination:    No dysmetria  Gait:    Normal native gait  Motor Observation:    No asymmetry, no atrophy, and no involuntary movements noted. Tone:    Normal muscle tone.    Posture:    Posture is normal. normal erect    Strength: right leg proximal weakness. Otherwise strength is V/V in the upper and lower limbs.      Sensation: intact to LT     Reflex Exam:  DTR's:    Deep tendon reflexes in the upper and lower extremities are brisk bilaterally.   Toes:    The toes are downgoing bilaterally.   Clonus:    Clonus is absent.    Assessment/Plan:  54 year old with chronic intractable migraine and chronic neck pain but given concerning symptoms needs further evaluation  MRI brain and cervical spine normal Failed Ajovy and multiple other medications, Ajovy with side  effects, start Botox for migraines Start Amitriptyline at bedtime, helps with neck pain, migraines and sleep. Take 2 hours before bed. Adjust the other sleep meds as necessary, may need less of those  Cervical myofascial pain syndrome: Kreg Shropshire, continue. Start Tizanidine and amitriptyline    Meds ordered this encounter  Medications  . tiZANidine (ZANAFLEX) 4 MG tablet    Sig: Take 1-2 tablets (4-8 mg total) by mouth every 8 (eight) hours as needed.    Dispense:  90 tablet    Refill:  6  . amitriptyline (ELAVIL) 25 MG tablet    Sig: Take 1 tablet (25 mg total) by mouth at bedtime. Take 2 hours before bedtime.    Dispense:  30 tablet    Refill:  3  . rizatriptan (MAXALT-MLT) 10 MG disintegrating tablet    Sig: Take 1 tablet (10 mg total) by mouth as needed for migraine. May repeat in 2 hours if needed    Dispense:  9 tablet    Refill:  11  . Rimegepant Sulfate (NURTEC) 75 MG TBDP    Sig: Take 75 mg by mouth daily as needed. For migraines. Take as close to onset of migraine as possible. One daily maximum.    Dispense:  4 tablet    Refill:  0    Discussed: To prevent or relieve headaches, try the following: Cool Compress. Lie down and place a cool compress on your head.  Avoid headache triggers. If certain foods or odors seem to have triggered your migraines in the past, avoid them. A headache diary might help you identify triggers.  Include physical activity in your daily routine. Try a daily walk or other moderate aerobic exercise.  Manage stress. Find healthy ways to cope with the stressors, such as delegating tasks on your to-do list.  Practice relaxation techniques. Try deep breathing, yoga, massage and visualization.  Eat regularly. Eating regularly scheduled meals and maintaining a healthy diet might help prevent headaches. Also, drink plenty of fluids.  Follow a regular sleep schedule. Sleep deprivation might contribute to headaches Consider biofeedback. With this mind-body technique, you learn to control certain bodily functions -- such as muscle tension, heart rate and blood pressure -- to prevent headaches or reduce headache pain.    Proceed to emergency room if you experience new or worsening symptoms  or symptoms do not resolve, if you have new neurologic symptoms or if headache is severe, or for any concerning symptom.   Provided education and documentation from American headache Society toolbox including articles on: chronic migraine medication overuse headache, chronic migraines, prevention of migraines, behavioral and other nonpharmacologic treatments for headache.  Cc: Tisovec, Fransico Him, MD,  Noemi Chapel PA  A total of 40 minutes was spent face to face with patient, on this patient's care, reviewing imaging, past records, notes and results. Over half this time was spent on counseling patient on the  1. Chronic migraine without aura, with intractable migraine, so stated, with status migrainosus   2. Cervical myofascial pain syndrome    diagnosis and different diagnostic and therapeutic options, counseling and coordination of care, risks and benefitsof management, compliance, or risk factor reduction and education.     Sarina Ill, MD  [facility] [address] [place], [place] [address]  Phone [phone] Fax [phone]

## 2019-08-03 NOTE — Telephone Encounter (Signed)
Can you please initiate botox approval. She has failed 4 months of ajovy and we are stopping it. Her insurance is changing to BC/BS primary and medicare secondary I think, please verify.

## 2019-08-04 DIAGNOSIS — H1033 Unspecified acute conjunctivitis, bilateral: Secondary | ICD-10-CM | POA: Diagnosis not present

## 2019-08-05 MED ORDER — BOTOX 100 UNITS IJ SOLR: INTRAMUSCULAR | 1 refills | Status: DC

## 2019-08-05 NOTE — Telephone Encounter (Signed)
New Botox order sent to [facility].

## 2019-08-05 NOTE — Telephone Encounter (Signed)
I called and scheduled the patient for her Botox injection in the first available apt slot, Toma Copier would you please send a script over to [facility]?

## 2019-08-05 NOTE — Addendum Note (Signed)
Addended by: Bertram Savin on: 08/05/2019 08:55 AM   Modules accepted: Orders

## 2019-08-06 NOTE — Telephone Encounter (Signed)
Noted, thank you. DWD  

## 2019-08-12 ENCOUNTER — Telehealth: Payer: Self-pay | Admitting: Neurology

## 2019-08-12 NOTE — Telephone Encounter (Signed)
Patient called to advise that the qty is missing from the patients order for her botox.   Pt also moved apt to 4/19

## 2019-08-13 ENCOUNTER — Other Ambulatory Visit: Payer: Self-pay

## 2019-08-13 ENCOUNTER — Ambulatory Visit: Payer: BC Managed Care – PPO | Attending: Neurology

## 2019-08-13 ENCOUNTER — Other Ambulatory Visit: Payer: Self-pay | Admitting: *Deleted

## 2019-08-13 DIAGNOSIS — M6281 Muscle weakness (generalized): Secondary | ICD-10-CM | POA: Insufficient documentation

## 2019-08-13 DIAGNOSIS — M542 Cervicalgia: Secondary | ICD-10-CM | POA: Diagnosis not present

## 2019-08-13 DIAGNOSIS — R252 Cramp and spasm: Secondary | ICD-10-CM | POA: Diagnosis not present

## 2019-08-13 DIAGNOSIS — G4489 Other headache syndrome: Secondary | ICD-10-CM | POA: Insufficient documentation

## 2019-08-13 NOTE — Therapy (Signed)
[facility] [address] [place], [place], [address] Phone: [phone]   Fax:  [phone]  Physical Therapy Treatment  Patient Details  Name: [name] [name] MRN: [id number] Date of Birth: 1965/08/05 Referring Provider (PT): Sarina Ill, MD   Encounter Date: 08/13/2019  PT End of Session - 08/13/19 1020    Visit Number  [id number]    Date for PT Re-Evaluation  08/19/19    Authorization Type  Medicare/BCBS    PT Start Time  0932    PT Stop Time  1017    PT Time Calculation (min)  45 min    Activity Tolerance  Patient tolerated treatment well    Behavior During Therapy  [facility] for tasks assessed/performed       Past Medical History:  Diagnosis Date  . Anxiety   . Brain fog   . Chronic pain    COMPLEX REGIONAL PAIN SYNDROME  . Hemorrhoids   . History of neck problems    dry needling being done & PT  . Leg cramping 2020   right, says sometimes she has a hard time putting pressure on that leg   . Migraine headache     Past Surgical History:  Procedure Laterality Date  . CHOLECYSTECTOMY    . DILATION AND CURETTAGE OF UTERUS    . ESOPHAGEAL MANOMETRY N/A 05/05/2018   Procedure: ESOPHAGEAL MANOMETRY (EM);  Surgeon: Clarene Essex, MD;  Location: [facility];  Service: Endoscopy;  Laterality: N/A;  . SHOULDER ARTHROSCOPY Left 2008  . THORACOSCOPY    . TONSILLECTOMY AND ADENOIDECTOMY     AT AGE 89  . WISDOM TOOTH EXTRACTION      There were no vitals filed for this visit.  Subjective Assessment - 08/13/19 0937    Subjective  I am feeling better today than last time.  I have been using my foam roll and that helps me.    Currently in Pain?  Yes    Pain Score  3     Pain Location  Neck    Pain Orientation  Left;Right    Pain Descriptors / Indicators  Aching    Pain Onset  More than a month ago    Pain Frequency  Constant    Aggravating Factors   unsure    Pain Relieving Factors  topical cream, ice, heat, rest                        [facility] Adult PT Treatment/Exercise - 08/13/19 0001      Shoulder Exercises: Supine   Other Supine Exercises  MELT method using foam roll, decompression on foll and thoracic extenison over roll      Manual Therapy   Manual Therapy  Soft tissue mobilization;Myofascial release    Manual therapy comments  thoracic and cervical musculature and PA mobs in thoracic spine       Trigger Point Dry Needling - 08/13/19 0001    Consent Given?  Yes    Muscles Treated Head and Neck  Upper trapezius;Oblique capitus;Suboccipitals;Cervical multifidi    Muscles Treated Upper Quadrant  Rhomboids;Subscapularis    Upper Trapezius Response  Twitch reponse elicited;Palpable increased muscle length    Oblique Capitus Response  Twitch response elicited;Palpable increased muscle length    Cervical multifidi Response  Twitch reponse elicited;Palpable increased muscle length             PT Short Term Goals - 08/13/19 2025  PT SHORT TERM GOAL #2   Title  report < or = to 4-5/10 neck pain with self-care and ADLs    Baseline  2-3/10 today    Time  4        PT Long Term Goals - 07/14/19 1019      PT LONG TERM GOAL #1   Title  be independent in advanced HEP    Time  6    Period  Weeks    Status  On-going    Target Date  08/25/19      PT LONG TERM GOAL #2   Title  reduce FOTO to < or = to 44% limitation    Baseline  41% limitation    Time  6    Period  Weeks    Status  On-going    Target Date  08/25/19      PT LONG TERM GOAL #3   Title  demonstrate bil cervical A/ROM rotation to > or = to 75 degrees to improve safety with driving    Baseline  all other ROM is full, rotation limited to 50-60 degrees due to lapse in treatment.    Time  6    Period  Weeks    Status  On-going      PT LONG TERM GOAL #4   Title  report < or =to  3/10 neck pain with ADLs and self-care    Baseline  in the past week 2-7/10    Time  6    Period  Weeks    Status   On-going    Target Date  08/25/19            Plan - 08/13/19 0947    Clinical Impression Statement  PT instructed pt in MELT method with foam roll.  Pt required tactile and verbal cues to reduce scapular elevation.  Pt with overall pain reduction vs last week when she had a flare up.  Pt reports that use of foam roll is helping at home. PT educated in the importance of tissue mobility to reduce pain.  Pt with overall improved tissue mobility since the start of care.  Pt with tension in Rt>Lt musculature and demonstrated improved tissue mobility after manual and dry needling today.  Pt will continue to benefit from skilled PT to address strength, flexibility and manual.    PT Frequency  1x / week    PT Duration  6 weeks    PT Treatment/Interventions  ADLs/Self Care Home Management;Cryotherapy;Electrical Stimulation;Ultrasound;Traction;Moist Heat;Therapeutic activities;Therapeutic exercise;Patient/family education;Neuromuscular re-education;Manual techniques;Passive range of motion;Dry needling;Spinal Manipulations;Taping;Joint Manipulations    PT Next Visit Plan  review foam roll exercises. Renewal next session.  continue strength, flexiblity and dry needling    Consulted and Agree with Plan of Care  Patient       Patient will benefit from skilled therapeutic intervention in order to improve the following deficits and impairments:  Decreased activity tolerance, Decreased strength, Decreased endurance, Decreased range of motion, Increased muscle spasms, Impaired flexibility, Impaired UE functional use, Postural dysfunction, Pain  Visit Diagnosis: Other headache syndrome  Cramp and spasm  Cervicalgia     Problem List Patient Active Problem List   Diagnosis Date Noted  . Chronic migraine without aura, with intractable migraine, so stated, with status migrainosus 04/30/2019  . Laryngopharyngeal reflux (LPR) 10/30/2017  . Lung nodule 10/30/2017  . Right foot pain 11/04/2012  .  Migraine with aura 02/06/2011  . CONSTIPATION, CHRONIC 07/11/2007  . 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  NAUSEA WITH VOMITING 06/13/2007  . 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 RESTLESS LEG SYNDROME 04/02/2007  . INSOMNIA 04/02/2007     [name], PT 08/13/19 10:22 AM  [facility] [address] [place], [place], [address] Phone: [phone]   Fax:  [phone]  Name: [name] [name] MRN: [id number] Date of Birth: 1965/10/25

## 2019-08-13 NOTE — Telephone Encounter (Signed)
Noted, thank you

## 2019-08-13 NOTE — Progress Notes (Signed)
We received a dispense clarification request for the botox order from [facility]. Our e-scribe ordered had already specified 200 units dispense amount. I clarified this on the paper request they sent and faxed it back to [facility] @ [phone]. Received a receipt of confirmation.

## 2019-08-17 ENCOUNTER — Other Ambulatory Visit: Payer: Self-pay

## 2019-08-17 ENCOUNTER — Ambulatory Visit: Payer: BC Managed Care – PPO

## 2019-08-17 DIAGNOSIS — R252 Cramp and spasm: Secondary | ICD-10-CM | POA: Diagnosis not present

## 2019-08-17 DIAGNOSIS — M542 Cervicalgia: Secondary | ICD-10-CM

## 2019-08-17 DIAGNOSIS — G4489 Other headache syndrome: Secondary | ICD-10-CM

## 2019-08-17 DIAGNOSIS — M6281 Muscle weakness (generalized): Secondary | ICD-10-CM

## 2019-08-17 NOTE — Therapy (Signed)
[facility] [address], [place], [place], [address] Phone: [phone]   Fax:  [phone]  Physical Therapy Treatment  Patient Details  Name: [name] [name] MRN: [id number] Date of Birth: [birth year] Referring Provider (PT): Sarina Ill, MD   Encounter Date: 08/17/2019  PT End of Session - 08/17/19 1548    Visit Number  15    Date for PT Re-Evaluation  09/28/19    Authorization Type  Medicare/BCBS    PT Start Time  3329    PT Stop Time  1446    PT Time Calculation (min)  43 min    Activity Tolerance  Patient tolerated treatment well    Behavior During Therapy  [facility] for tasks assessed/performed       Past Medical History:  Diagnosis Date  . Anxiety   . Brain fog   . Chronic pain    COMPLEX REGIONAL PAIN SYNDROME  . Hemorrhoids   . History of neck problems    dry needling being done & PT  . Leg cramping 2020   right, says sometimes she has a hard time putting pressure on that leg   . Migraine headache     Past Surgical History:  Procedure Laterality Date  . CHOLECYSTECTOMY    . DILATION AND CURETTAGE OF UTERUS    . ESOPHAGEAL MANOMETRY N/A 05/05/2018   Procedure: ESOPHAGEAL MANOMETRY (EM);  Surgeon: Clarene Essex, MD;  Location: [facility];  Service: Endoscopy;  Laterality: N/A;  . SHOULDER ARTHROSCOPY Left 2008  . THORACOSCOPY    . TONSILLECTOMY AND ADENOIDECTOMY     AT AGE 54  . WISDOM TOOTH EXTRACTION      There were no vitals filed for this visit.  Subjective Assessment - 08/17/19 1427    Subjective  I am 70-80% better.  I have a hard time doing the dishes or sitting and looking down for long periods    Currently in Pain?  Yes    Pain Location  Neck    Pain Orientation  Right;Left    Pain Descriptors / Indicators  Aching    Pain Onset  More than a month ago    Pain Frequency  Constant    Aggravating Factors   washing dishes, looking down, looking up    Pain Relieving Factors  topical  cream, ice, heat         [facility] PT Assessment - 08/17/19 0001      Assessment   Medical Diagnosis  chronic migraine without aura, cervical myofascial pain syndrome    Referring Provider (PT)  Sarina Ill, MD    Onset Date/Surgical Date  11/10/18    Hand Dominance  Right      Prior Function   Level of Independence  Independent      Observation/Other Assessments   Focus on Therapeutic Outcomes (FOTO)   39% limitation      AROM   Cervical Flexion  70    Cervical Extension  50    Cervical - Right Side Bend  50    Cervical - Left Side Bend  55    Cervical - Right Rotation  50    Cervical - Left Rotation  60                   [facility] Adult PT Treatment/Exercise - 08/17/19 0001      Shoulder Exercises: Standing   Extension  Strengthening;Both;12 reps    Theraband Level (Shoulder Extension)  Level 1 (  Yellow)    Extension Limitations  handles used and max verbal/demo cues to reduce activation of wrist extensors      Manual Therapy   Manual Therapy  Soft tissue mobilization;Myofascial release    Manual therapy comments  thoracic and cervical musculature and PA mobs in thoracic spine       Trigger Point Dry Needling - 08/17/19 0001    Consent Given?  Yes    Muscles Treated Head and Neck  Upper trapezius    Muscles Treated Upper Quadrant  Rhomboids;Subscapularis    Upper Trapezius Response  Twitch reponse elicited;Palpable increased muscle length    Cervical multifidi Response  Twitch reponse elicited;Palpable increased muscle length    Rhomboids Response  Palpable increased muscle length;Twitch response elicited    Subscapularis Response  Twitch response elicited;Palpable increased muscle length             PT Short Term Goals - 08/13/19 0942      PT SHORT TERM GOAL #2   Title  report < or = to 4-5/10 neck pain with self-care and ADLs    Baseline  2-3/10 today    Time  4        PT Long Term Goals - 08/17/19 1405      PT LONG TERM GOAL #1   Title   be independent in advanced HEP    Time  6    Period  Weeks    Status  On-going    Target Date  09/28/19      PT LONG TERM GOAL #2   Title  reduce FOTO to < or = to 44% limitation    Baseline  39% limitation    Status  Achieved      PT LONG TERM GOAL #3   Title  demonstrate bil cervical A/ROM rotation to > or = to 75 degrees to improve safety with driving    Baseline  60 degrees    Time  6    Period  Weeks    Status  On-going      PT LONG TERM GOAL #4   Title  report < or =to  3/10 neck pain/thoracic pain with ADLs and self-care    Baseline  range 1-5/10    Time  6    Period  Weeks    Status  On-going    Target Date  09/28/19      PT LONG TERM GOAL #5   Title  improve postural strength to wash dishes with 50% less thoracic and cervical pain    Time  6    Period  Weeks    Status  New    Target Date  09/28/19            Plan - 08/17/19 1553    Clinical Impression Statement  Pt reports 70-80% overall improvement since the start of care.  PT has not been able to advance exercises to include postural strength due to pt has history of Lt UE pain with upper extremity activity.  Pt with most pain in the neck and thoracic spine with doing dishes and looking down to do paperwork.  PT suggested that we progress with some strength exercises and PT spent significant time instructing in shoulder extension exercises with band to avoid wrist extensor activation.  Pt will start with slow progression of these exercises.  Pt with improved cervical A/ROM and pain overall.  Pt with functional weakness associated with chronic neck and headache condition and will  continue to benefit from skilled PT to address tissue mobility, gentle progression of postural strength and flexibility.    PT Frequency  1x / week    PT Duration  6 weeks    PT Treatment/Interventions  ADLs/Self Care Home Management;Cryotherapy;Electrical Stimulation;Ultrasound;Traction;Moist Heat;Therapeutic activities;Therapeutic  exercise;Patient/family education;Neuromuscular re-education;Manual techniques;Passive range of motion;Dry needling;Spinal Manipulations;Taping;Joint Manipulations    PT Next Visit Plan  see how pt does with shoulder extension.  Try supine band with handles.  Dry needling as needed.    Recommended Other Services  recert sent 08/17/19    Consulted and Agree with Plan of Care  Patient       Patient will benefit from skilled therapeutic intervention in order to improve the following deficits and impairments:  Decreased activity tolerance, Decreased strength, Decreased endurance, Decreased range of motion, Increased muscle spasms, Impaired flexibility, Impaired UE functional use, Postural dysfunction, Pain  Visit Diagnosis: Other headache syndrome - Plan: PT plan of care cert/re-cert  Cramp and spasm - Plan: PT plan of care cert/re-cert  Cervicalgia - Plan: PT plan of care cert/re-cert  Muscle weakness (generalized) - Plan: PT plan of care cert/re-cert     Problem List Patient Active Problem List   Diagnosis Date Noted  . Chronic migraine without aura, with intractable migraine, so stated, with status migrainosus 04/30/2019  . Laryngopharyngeal reflux (LPR) 10/30/2017  . Lung nodule 10/30/2017  . Right foot pain 11/04/2012  . Migraine with aura 02/06/2011  . CONSTIPATION, CHRONIC 07/11/2007  . 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 NAUSEA WITH VOMITING 06/13/2007  . 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 RESTLESS LEG SYNDROME 04/02/2007  . INSOMNIA 04/02/2007     [name], PT 08/17/19 3:59 PM  [facility] [address], [address] [place], [place], [address] Phone: [phone]   Fax:  [phone]  Name: ESTEFANNY MOLER MRN: [id number] Date of Birth: 06-07-1966

## 2019-08-25 ENCOUNTER — Other Ambulatory Visit: Payer: Self-pay | Admitting: Neurology

## 2019-08-26 ENCOUNTER — Ambulatory Visit: Payer: BC Managed Care – PPO

## 2019-08-31 ENCOUNTER — Ambulatory Visit: Payer: BC Managed Care – PPO

## 2019-09-10 ENCOUNTER — Other Ambulatory Visit: Payer: Self-pay

## 2019-09-10 ENCOUNTER — Ambulatory Visit: Payer: BC Managed Care – PPO | Attending: Neurology

## 2019-09-10 DIAGNOSIS — R252 Cramp and spasm: Secondary | ICD-10-CM | POA: Insufficient documentation

## 2019-09-10 DIAGNOSIS — G4489 Other headache syndrome: Secondary | ICD-10-CM | POA: Diagnosis not present

## 2019-09-10 DIAGNOSIS — M6281 Muscle weakness (generalized): Secondary | ICD-10-CM | POA: Diagnosis not present

## 2019-09-10 DIAGNOSIS — M542 Cervicalgia: Secondary | ICD-10-CM | POA: Insufficient documentation

## 2019-09-10 NOTE — Therapy (Signed)
[facility] [address] [place], [place], [address] Phone: [phone]   Fax:  [phone]  Physical Therapy Treatment  Patient Details  Name: [name] [name] MRN: [id number] Date of Birth: 29-Oct-1965 Referring Provider (PT): Naomie Dean, MD   Encounter Date: 09/10/2019  PT End of Session - 09/10/19 1011    Visit Number  16    Date for PT Re-Evaluation  09/28/19    Authorization Type  Medicare/BCBS    PT Start Time  0931    PT Stop Time  1012    PT Time Calculation (min)  41 min    Activity Tolerance  Patient tolerated treatment well    Behavior During Therapy  [facility] for tasks assessed/performed       Past Medical History:  Diagnosis Date  . Anxiety   . Brain fog   . Chronic pain    COMPLEX REGIONAL PAIN SYNDROME  . Hemorrhoids   . History of neck problems    dry needling being done & PT  . Leg cramping 2020   right, says sometimes she has a hard time putting pressure on that leg   . Migraine headache     Past Surgical History:  Procedure Laterality Date  . CHOLECYSTECTOMY    . DILATION AND CURETTAGE OF UTERUS    . ESOPHAGEAL MANOMETRY N/A 05/05/2018   Procedure: ESOPHAGEAL MANOMETRY (EM);  Surgeon: Vida Rigger, MD;  Location: [facility];  Service: Endoscopy;  Laterality: N/A;  . SHOULDER ARTHROSCOPY Left 2008  . THORACOSCOPY    . TONSILLECTOMY AND ADENOIDECTOMY     AT AGE 13  . WISDOM TOOTH EXTRACTION      There were no vitals filed for this visit.  Subjective Assessment - 09/10/19 0933    Subjective  I have had a lapse in treatment due to conflicts.  I have done the theraband some but not much.  Doing foam roll daily.    Currently in Pain?  Yes    Pain Score  2     Pain Location  Neck    Pain Orientation  Left;Right    Pain Descriptors / Indicators  Aching    Pain Type  Chronic pain    Pain Onset  More than a month ago    Pain Frequency  Constant    Aggravating Factors   turning  head, looking up    Pain Relieving Factors  topical cream, ice/heat                       [facility] Adult PT Treatment/Exercise - 09/10/19 0001      Manual Therapy   Manual Therapy  Soft tissue mobilization;Myofascial release    Manual therapy comments  thoracic and cervical musculature and PA mobs in thoracic spine       Trigger Point Dry Needling - 09/10/19 0001    Consent Given?  Yes    Muscles Treated Head and Neck  Upper trapezius    Muscles Treated Upper Quadrant  Rhomboids;Subscapularis    Upper Trapezius Response  Twitch reponse elicited;Palpable increased muscle length    Cervical multifidi Response  Twitch reponse elicited;Palpable increased muscle length    Rhomboids Response  Palpable increased muscle length;Twitch response elicited    Subscapularis Response  Twitch response elicited;Palpable increased muscle length             PT Short Term Goals - 08/13/19 0942      PT SHORT TERM  GOAL #2   Title  report < or = to 4-5/10 neck pain with self-care and ADLs    Baseline  2-3/10 today    Time  4        PT Long Term Goals - 08/17/19 1405      PT LONG TERM GOAL #1   Title  be independent in advanced HEP    Time  6    Period  Weeks    Status  On-going    Target Date  09/28/19      PT LONG TERM GOAL #2   Title  reduce FOTO to < or = to 44% limitation    Baseline  39% limitation    Status  Achieved      PT LONG TERM GOAL #3   Title  demonstrate bil cervical A/ROM rotation to > or = to 75 degrees to improve safety with driving    Baseline  60 degrees    Time  6    Period  Weeks    Status  On-going      PT LONG TERM GOAL #4   Title  report < or =to  3/10 neck pain/thoracic pain with ADLs and self-care    Baseline  range 1-5/10    Time  6    Period  Weeks    Status  On-going    Target Date  09/28/19      PT LONG TERM GOAL #5   Title  improve postural strength to wash dishes with 50% less thoracic and cervical pain    Time  6    Period   Weeks    Status  New    Target Date  09/28/19            Plan - 09/10/19 0940    Clinical Impression Statement  Pt has had a 3 week lapse in treatment due to conflict in schedule. Pt with reduced pain overall over the past couple of weeks and has been using her foam roll for tissue mobility.  Pt has not been as consistent with band exercises and PT stressed the importance of this for postural strength.  Pt denies any aggravation of wrist extensor symptoms with band exercises. Pt reports 70-80% overall improvement since the start of care.  Pt with improved cervical A/ROM and pain overall. Pt with significant improvement in thoracic and cervical multifidi tension and has moderate tension in bil suboccipitals and upper traps due to increased computer use at home.  Pt demonstrated improved tissue mobility after dry needling and manual therapy today.    Pt with functional weakness associated with chronic neck and headache condition and will continue to benefit from skilled PT to address tissue mobility, gentle progression of postural strength and flexibility.    PT Frequency  1x / week    PT Duration  6 weeks    PT Treatment/Interventions  ADLs/Self Care Home Management;Cryotherapy;Electrical Stimulation;Ultrasound;Traction;Moist Heat;Therapeutic activities;Therapeutic exercise;Patient/family education;Neuromuscular re-education;Manual techniques;Passive range of motion;Dry needling;Spinal Manipulations;Taping;Joint Manipulations    PT Next Visit Plan  see how pt does with shoulder extension.  Try supine band with handles.  Dry needling as needed.    PT Home Exercise Plan  review HEP issued at previous PT    Consulted and Agree with Plan of Care  Patient       Patient will benefit from skilled therapeutic intervention in order to improve the following deficits and impairments:  Decreased activity tolerance, Decreased strength, Decreased endurance, Decreased range of motion,  Increased muscle spasms,  Impaired flexibility, Impaired UE functional use, Postural dysfunction, Pain  Visit Diagnosis: Other headache syndrome  Cramp and spasm  Cervicalgia  Muscle weakness (generalized)     Problem List Patient Active Problem List   Diagnosis Date Noted  . Chronic migraine without aura, with intractable migraine, so stated, with status migrainosus 04/30/2019  . Laryngopharyngeal reflux (LPR) 10/30/2017  . Lung nodule 10/30/2017  . Right foot pain 11/04/2012  . Migraine with aura 02/06/2011  . CONSTIPATION, CHRONIC 07/11/2007  . 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 NAUSEA WITH VOMITING 06/13/2007  . 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 RESTLESS LEG SYNDROME 04/02/2007  . INSOMNIA 04/02/2007     [name], PT 09/10/19 10:13 AM  [facility] [address], [address] [place], [place], [address] Phone: [phone]   Fax:  [phone]  Name: [name] [name] MRN: [id number] Date of Birth: 12/27/1965

## 2019-09-15 ENCOUNTER — Ambulatory Visit: Payer: Medicare Other | Admitting: Neurology

## 2019-09-16 ENCOUNTER — Ambulatory Visit: Payer: BC Managed Care – PPO

## 2019-09-16 ENCOUNTER — Telehealth: Payer: Self-pay | Admitting: *Deleted

## 2019-09-16 NOTE — Telephone Encounter (Signed)
Ref# for call with Katheren Shams at [phone] is [id number].  Also need to put the Ref# on fax with form and notes

## 2019-09-16 NOTE — Telephone Encounter (Signed)
I called [employer] [phone] and spoke to Nelson.  She states that [phone] and [phone] are billable.  [id number] will require PA.  Ref# for call is [id number].  I then called [phone], Utilization Management, to initiate PA.Marland Kitchen  Spoke to [employer].  She started the process and states she will fax me the form to fill out and send back with clinical notes attatched.

## 2019-09-17 NOTE — Telephone Encounter (Signed)
PA form filled out and faxed with clinical notes to [facility] [phone]

## 2019-09-21 NOTE — Telephone Encounter (Signed)
I called [facility] [phone] to schedule medication delivery.  Spoke to [place] who states this is pending patient consent.  Once they speak to the patient they will call us to schedule delivery.  He was made aware patient is scheduled for 09/28/2019

## 2019-09-21 NOTE — Telephone Encounter (Signed)
Prior Approval received via fax from [place].  Ref# [id number] Valid from 09/16/2019-03/14/2020 2 Visits.

## 2019-09-22 NOTE — Telephone Encounter (Signed)
I returned patients call.  She states she has not heard from the specialty pharmacy regarding copay and delivery.  I gave her their number to call.

## 2019-09-22 NOTE — Telephone Encounter (Signed)
Pt has called to speak with Annabelle Harman re: her Botox medication and appointment

## 2019-09-23 ENCOUNTER — Other Ambulatory Visit: Payer: Self-pay

## 2019-09-23 ENCOUNTER — Ambulatory Visit: Payer: BC Managed Care – PPO

## 2019-09-23 DIAGNOSIS — M542 Cervicalgia: Secondary | ICD-10-CM

## 2019-09-23 DIAGNOSIS — J310 Chronic rhinitis: Secondary | ICD-10-CM | POA: Diagnosis not present

## 2019-09-23 DIAGNOSIS — G4489 Other headache syndrome: Secondary | ICD-10-CM | POA: Diagnosis not present

## 2019-09-23 DIAGNOSIS — M6281 Muscle weakness (generalized): Secondary | ICD-10-CM | POA: Diagnosis not present

## 2019-09-23 DIAGNOSIS — R252 Cramp and spasm: Secondary | ICD-10-CM | POA: Diagnosis not present

## 2019-09-23 NOTE — Therapy (Signed)
[facility] [address] [place], [place], [address] Phone: [phone]   Fax:  [phone]  Physical Therapy Treatment  Patient Details  Name: [name] [name] MRN: [id number] Date of Birth: Aug 04, 1965 Referring Provider (PT): Naomie Dean, MD   Encounter Date: 09/23/2019  PT End of Session - 09/23/19 0930    Visit Number  17    Date for PT Re-Evaluation  11/04/19    Authorization Type  Medicare/BCBS    PT Start Time  0847    PT Stop Time  0929    PT Time Calculation (min)  42 min    Activity Tolerance  Patient tolerated treatment well    Behavior During Therapy  [facility] for tasks assessed/performed       Past Medical History:  Diagnosis Date  . Anxiety   . Brain fog   . Chronic pain    COMPLEX REGIONAL PAIN SYNDROME  . Hemorrhoids   . History of neck problems    dry needling being done & PT  . Leg cramping 2020   right, says sometimes she has a hard time putting pressure on that leg   . Migraine headache     Past Surgical History:  Procedure Laterality Date  . CHOLECYSTECTOMY    . DILATION AND CURETTAGE OF UTERUS    . ESOPHAGEAL MANOMETRY N/A 05/05/2018   Procedure: ESOPHAGEAL MANOMETRY (EM);  Surgeon: Vida Rigger, MD;  Location: [facility];  Service: Endoscopy;  Laterality: N/A;  . SHOULDER ARTHROSCOPY Left 2008  . THORACOSCOPY    . TONSILLECTOMY AND ADENOIDECTOMY     AT AGE 20  . WISDOM TOOTH EXTRACTION      There were no vitals filed for this visit.  Subjective Assessment - 09/23/19 0854    Subjective  I feel 70% overall improvement since the start of care.    Patient Stated Goals  reduce neck pain and headaches    Currently in Pain?  Yes    Pain Score  2     Pain Location  Neck    Pain Orientation  Right;Left    Pain Descriptors / Indicators  Aching    Pain Type  Chronic pain    Pain Onset  More than a month ago    Pain Frequency  Constant    Aggravating Factors   turning head,  looking up, reaching overehead, endurance tasks    Pain Relieving Factors  topical, rest, ice/heat         [facility] PT Assessment - 09/23/19 0001      Assessment   Medical Diagnosis  chronic migraine without aura, cervical myofascial pain syndrome    Referring Provider (PT)  Naomie Dean, MD    Onset Date/Surgical Date  11/10/18    Hand Dominance  Right      Prior Function   Level of Independence  Independent      Observation/Other Assessments   Focus on Therapeutic Outcomes (FOTO)   46% limitation      AROM   Cervical Flexion  70    Cervical Extension  50    Cervical - Right Side Bend  50    Cervical - Left Side Bend  55    Cervical - Right Rotation  65    Cervical - Left Rotation  70                   [facility] Adult PT Treatment/Exercise - 09/23/19 0001      Manual  Therapy   Manual Therapy  Soft tissue mobilization;Myofascial release    Manual therapy comments  thoracic and cervical musculature and PA mobs in thoracic spine       Trigger Point Dry Needling - 09/23/19 0001    Consent Given?  Yes    Muscles Treated Head and Neck  Upper trapezius    Muscles Treated Upper Quadrant  Rhomboids;Subscapularis    Upper Trapezius Response  Twitch reponse elicited;Palpable increased muscle length    Cervical multifidi Response  Twitch reponse elicited;Palpable increased muscle length    Rhomboids Response  Palpable increased muscle length;Twitch response elicited    Subscapularis Response  Twitch response elicited;Palpable increased muscle length             PT Short Term Goals - 08/13/19 0942      PT SHORT TERM GOAL #2   Title  report < or = to 4-5/10 neck pain with self-care and ADLs    Baseline  2-3/10 today    Time  4        PT Long Term Goals - 09/23/19 0850      PT LONG TERM GOAL #1   Title  be independent in advanced HEP    Time  6    Period  Weeks    Status  On-going    Target Date  11/04/19      PT LONG TERM GOAL #2   Title  reduce  FOTO to < or = to 44% limitation    Baseline  46%    Time  6    Period  Weeks    Status  On-going    Target Date  11/04/19      PT LONG TERM GOAL #3   Title  demonstrate bil cervical A/ROM rotation to > or = to 75 degrees to improve safety with driving    Baseline  65 and 70    Time  6    Period  Weeks    Status  On-going    Target Date  11/04/19      PT LONG TERM GOAL #4   Title  report < or =to  3/10 neck pain/thoracic pain with ADLs and self-care      PT LONG TERM GOAL #5   Title  improve postural strength to wash dishes with 90 % less thoracic and cervical pain    Baseline  70%    Time  6    Period  Weeks    Status  Revised    Target Date  11/04/19            Plan - 09/23/19 0906    Clinical Impression Statement  Pt reports 70% overall improvement in neck symptoms with function tasks.  Pt with improved cervical A/ROM and remains slightly limited with rotation at end range.  Pt continues to work on HEP for flexibility, strength and mobility.  Pt has spaced out her dry needling sessions and continues to improve.  Pt will benefit from 2-3 more sessions over the next 6 weeks to continue to improve ROM and tissue mobility.  Pt with tension and trigger points greatly improved in the cervical and thoracic spine and demonstrated improved tissue mobility after manual therapy today.    PT Frequency  1x / week    PT Duration  6 weeks    PT Treatment/Interventions  ADLs/Self Care Home Management;Cryotherapy;Electrical Stimulation;Ultrasound;Traction;Moist Heat;Therapeutic activities;Therapeutic exercise;Patient/family education;Neuromuscular re-education;Manual techniques;Passive range of motion;Dry needling;Spinal Manipulations;Taping;Joint Manipulations    PT  Next Visit Plan  2-3 more sessions over 6 weeks    Recommended Other Services  recert sent 09/23/19    Consulted and Agree with Plan of Care  Patient       Patient will benefit from skilled therapeutic intervention in order  to improve the following deficits and impairments:  Decreased activity tolerance, Decreased strength, Decreased endurance, Decreased range of motion, Increased muscle spasms, Impaired flexibility, Impaired UE functional use, Postural dysfunction, Pain  Visit Diagnosis: Cramp and spasm - Plan: PT plan of care cert/re-cert  Other headache syndrome - Plan: PT plan of care cert/re-cert  Cervicalgia - Plan: PT plan of care cert/re-cert  Muscle weakness (generalized) - Plan: PT plan of care cert/re-cert     Problem List Patient Active Problem List   Diagnosis Date Noted  . Chronic migraine without aura, with intractable migraine, so stated, with status migrainosus 04/30/2019  . Laryngopharyngeal reflux (LPR) 10/30/2017  . Lung nodule 10/30/2017  . Right foot pain 11/04/2012  . Migraine with aura 02/06/2011  . CONSTIPATION, CHRONIC 07/11/2007  . 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 NAUSEA WITH VOMITING 06/13/2007  . 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 RESTLESS LEG SYNDROME 04/02/2007  . INSOMNIA 04/02/2007     [name], PT 09/23/19 9:31 AM  [facility] [address] [place], [place], [address] Phone: [phone]   Fax:  [phone]  Name: [name] [name] MRN: [id number] Date of Birth: 1966/03/07

## 2019-09-23 NOTE — Telephone Encounter (Signed)
I called [facility] [phone] again.  Spoke to [place] to schedule delivery.  He states they have obtained consent  so they can schedule delivery.  To be delivered tomorrow 09/24/2019.

## 2019-09-23 NOTE — Telephone Encounter (Signed)
I called [facility] [phone] again today.  Spoke to [place] who states they did speak to the patient but they are still verifying patient benefits.  Once benefits are verified they will know the copay and will contact patient to collect copay and get consent. She states it usually takes 24-48 hours to complete the verification process.  I told her we first requested this on 09/21/2019. States she has sent a message that this needs to be expedited as patient is scheduled on 09/28/2019.  They will call the office today before 5PM to let us know if this has been taken care of one way or the other.

## 2019-09-24 DIAGNOSIS — F4323 Adjustment disorder with mixed anxiety and depressed mood: Secondary | ICD-10-CM | POA: Diagnosis not present

## 2019-09-27 NOTE — Progress Notes (Signed)
This is our first botox. She has very tight masseters +10 each side. 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 No side effects, she did well, if she has any forehead drooping we can move the botox higher, advised not to lay down for 4 hours and no rubbing face to avoid botox spread and ptosis.    Consent Form Botulism Toxin Injection For Chronic Migraine    Reviewed orally with patient, additionally signature is on file:  Botulism toxin has been approved by the Federal drug administration for treatment of chronic migraine. Botulism toxin does not cure chronic migraine and it may not be effective in some patients.  The administration of botulism toxin is accomplished by injecting a small amount of toxin into the muscles of the neck and head. Dosage must be titrated for each individual. Any benefits resulting from botulism toxin tend to wear off after 3 months with a repeat injection required if benefit is to be maintained. Injections are usually done every 3-4 months with maximum effect peak achieved by about 2 or 3 weeks. Botulism toxin is expensive and you should be sure of what costs you will incur resulting from the injection.  The side effects of botulism toxin use for chronic migraine may include:   -Transient, and usually mild, facial weakness with facial injections  -Transient, and usually mild, head or neck weakness with head/neck injections  -Reduction or loss of forehead facial animation due to forehead muscle weakness  -Eyelid drooping  -Dry eye  -Pain at the site of injection or bruising at the site of injection  -Double vision  -Potential unknown long term risks  Contraindications: You should not have Botox if you are pregnant, nursing, allergic to albumin, have an infection, skin condition, or muscle weakness at the site of the  injection, or have myasthenia gravis, Lambert-Eaton syndrome, or ALS.  It is also possible that as with any injection, there may be an allergic reaction or no effect from the medication. Reduced effectiveness after repeated injections is sometimes seen and rarely infection at the injection site may occur. All care will be taken to prevent these side effects. If therapy is given over a long time, atrophy and wasting in the muscle injected may occur. Occasionally the patient's become refractory to treatment because they develop antibodies to the toxin. In this event, therapy needs to be modified.  I have read the above information and consent to the administration of botulism toxin.    BOTOX PROCEDURE NOTE FOR MIGRAINE HEADACHE    Contraindications and precautions discussed with patient(above). Aseptic procedure was observed and patient tolerated procedure. Procedure performed by Dr. Georgia Dom  The condition has existed for more than 6 months, and pt does not have a diagnosis of ALS, Myasthenia Gravis or Lambert-Eaton Syndrome.  Risks and benefits of injections discussed and pt agrees to proceed with the procedure.  Written consent obtained  These injections are medically necessary. Pt  receives good benefits from these injections. These injections do not cause sedations or hallucinations which the oral therapies may cause.  Description of procedure:  The patient was placed in a sitting position. The standard protocol was used for Botox as follows, with 5 units of Botox injected at each site:   -Procerus muscle, midline injection  -Corrugator muscle, bilateral injection  -Frontalis muscle, bilateral injection,  with 2 sites each side, medial injection was performed in the upper one third of the frontalis muscle, in the region vertical from the medial inferior edge of the superior orbital rim. The lateral injection was again in the upper one third of the forehead vertically above the lateral  limbus of the cornea, 1.5 cm lateral to the medial injection site.  -Temporalis muscle injection, 4 sites, bilaterally. The first injection was 3 cm above the tragus of the ear, second injection site was 1.5 cm to 3 cm up from the first injection site in line with the tragus of the ear. The third injection site was 1.5-3 cm forward between the first 2 injection sites. The fourth injection site was 1.5 cm posterior to the second injection site.   -Occipitalis muscle injection, 3 sites, bilaterally. The first injection was done one half way between the occipital protuberance and the tip of the mastoid process behind the ear. The second injection site was done lateral and superior to the first, 1 fingerbreadth from the first injection. The third injection site was 1 fingerbreadth superiorly and medially from the first injection site.  -Cervical paraspinal muscle injection, 2 sites, bilateral knee first injection site was 1 cm from the midline of the cervical spine, 3 cm inferior to the lower border of the occipital protuberance. The second injection site was 1.5 cm superiorly and laterally to the first injection site.  -Trapezius muscle injection was performed at 3 sites, bilaterally. The first injection site was in the upper trapezius muscle halfway between the inflection point of the neck, and the acromion. The second injection site was one half way between the acromion and the first injection site. The third injection was done between the first injection site and the inflection point of the neck.   Will return for repeat injection in 3 months.   200 units of Botox was used, any Botox not injected was wasted. The patient tolerated the procedure well, there were no complications of the above procedure.

## 2019-09-28 ENCOUNTER — Ambulatory Visit (INDEPENDENT_AMBULATORY_CARE_PROVIDER_SITE_OTHER): Payer: Medicare Other | Admitting: Neurology

## 2019-09-28 ENCOUNTER — Other Ambulatory Visit: Payer: Self-pay

## 2019-09-28 VITALS — Temp 97.2°F

## 2019-09-28 DIAGNOSIS — G43711 Chronic migraine without aura, intractable, with status migrainosus: Secondary | ICD-10-CM

## 2019-09-28 NOTE — Progress Notes (Signed)
Botox consents signed today Botox- 100 units x 2 vials Lot: [id number] Expiration: 12/2021 NDC: 8719-5974-71  Bacteriostatic 0.9% Sodium Chloride- 37mL total Lot: [id number] Expiration: 12/10/2019 NDC: 8682-5749-35  Dx: L21.747 S/P

## 2019-10-02 DIAGNOSIS — F4321 Adjustment disorder with depressed mood: Secondary | ICD-10-CM | POA: Diagnosis not present

## 2019-10-05 ENCOUNTER — Ambulatory Visit: Payer: BC Managed Care – PPO

## 2019-10-05 ENCOUNTER — Other Ambulatory Visit: Payer: Self-pay

## 2019-10-05 DIAGNOSIS — R252 Cramp and spasm: Secondary | ICD-10-CM

## 2019-10-05 DIAGNOSIS — G4489 Other headache syndrome: Secondary | ICD-10-CM

## 2019-10-05 DIAGNOSIS — M6281 Muscle weakness (generalized): Secondary | ICD-10-CM

## 2019-10-05 DIAGNOSIS — M542 Cervicalgia: Secondary | ICD-10-CM

## 2019-10-05 NOTE — Therapy (Signed)
[facility] [address] [place], [place], [address] Phone: [phone]   Fax:  [phone]  Physical Therapy Treatment  Patient Details  Name: [name] [name] MRN: [id number] Date of Birth: 12-May-1966 Referring Provider (PT): Naomie Dean, MD   Encounter Date: 10/05/2019  PT End of Session - 10/05/19 1221    Visit Number  18    Date for PT Re-Evaluation  11/04/19    Authorization Type  Medicare/BCBS    PT Start Time  0757    PT Stop Time  0830    PT Time Calculation (min)  33 min    Activity Tolerance  Patient tolerated treatment well    Behavior During Therapy  [facility] for tasks assessed/performed       Past Medical History:  Diagnosis Date  . Anxiety   . Brain fog   . Chronic pain    COMPLEX REGIONAL PAIN SYNDROME  . Hemorrhoids   . History of neck problems    dry needling being done & PT  . Leg cramping 2020   right, says sometimes she has a hard time putting pressure on that leg   . Migraine headache     Past Surgical History:  Procedure Laterality Date  . CHOLECYSTECTOMY    . DILATION AND CURETTAGE OF UTERUS    . ESOPHAGEAL MANOMETRY N/A 05/05/2018   Procedure: ESOPHAGEAL MANOMETRY (EM);  Surgeon: Vida Rigger, MD;  Location: [facility];  Service: Endoscopy;  Laterality: N/A;  . SHOULDER ARTHROSCOPY Left 2008  . THORACOSCOPY    . TONSILLECTOMY AND ADENOIDECTOMY     AT AGE 11  . WISDOM TOOTH EXTRACTION      There were no vitals filed for this visit.  Subjective Assessment - 10/05/19 1218    Subjective  I had botox for my migraines and this has helped my headaches overall.  80% overall improvement since starting PT.    Currently in Pain?  Yes    Pain Score  6     Pain Location  Neck    Pain Orientation  Right;Left    Pain Descriptors / Indicators  Aching    Pain Type  Chronic pain    Pain Onset  More than a month ago    Pain Frequency  Constant    Aggravating Factors   turnin head,  looking up, reaching overhead, endurance tasks    Pain Relieving Factors  topical, rest, ice/heat                       [facility] Adult PT Treatment/Exercise - 10/05/19 0001      Manual Therapy   Manual Therapy  Soft tissue mobilization;Myofascial release    Manual therapy comments  thoracic and cervical musculature and PA mobs in thoracic spine       Trigger Point Dry Needling - 10/05/19 0001    Consent Given?  Yes    Muscles Treated Head and Neck  Upper trapezius    Muscles Treated Upper Quadrant  Rhomboids;Subscapularis    Upper Trapezius Response  Twitch reponse elicited;Palpable increased muscle length    Cervical multifidi Response  Twitch reponse elicited;Palpable increased muscle length    Rhomboids Response  Palpable increased muscle length;Twitch response elicited    Subscapularis Response  Twitch response elicited;Palpable increased muscle length             PT Short Term Goals - 08/13/19 0942      PT SHORT TERM GOAL #  2   Title  report < or = to 4-5/10 neck pain with self-care and ADLs    Baseline  2-3/10 today    Time  4        PT Long Term Goals - 09/23/19 0850      PT LONG TERM GOAL #1   Title  be independent in advanced HEP    Time  6    Period  Weeks    Status  On-going    Target Date  11/04/19      PT LONG TERM GOAL #2   Title  reduce FOTO to < or = to 44% limitation    Baseline  46%    Time  6    Period  Weeks    Status  On-going    Target Date  11/04/19      PT LONG TERM GOAL #3   Title  demonstrate bil cervical A/ROM rotation to > or = to 75 degrees to improve safety with driving    Baseline  65 and 70    Time  6    Period  Weeks    Status  On-going    Target Date  11/04/19      PT LONG TERM GOAL #4   Title  report < or =to  3/10 neck pain/thoracic pain with ADLs and self-care      PT LONG TERM GOAL #5   Title  improve postural strength to wash dishes with 90 % less thoracic and cervical pain    Baseline  70%     Time  6    Period  Weeks    Status  Revised    Target Date  11/04/19            Plan - 10/05/19 1220    Clinical Impression Statement  Pt with 2 week lapse in treatment.  Pt had botox treatment for migraines and reports an overall reduction in migraine frequency and intensity.  Pt reports 6/10 Rt sided neck and upper trap pain today.  Overall, pain has been 3-6/10 depending on activity.  Pt reports 80% overall improvement since the start of care and is independent and compliant with HEP for strength and flexibility.  Pt with trigger points that are overall improved in the thoracic and cervical spine.  Pt demonstrated improved tissue mobility and reduced pain after manual therapy today.  Pt will continue to benefit from dry needling and manual therapy to manage chronic pain syndrome.    Rehab Potential  Excellent    PT Frequency  1x / week    PT Duration  6 weeks    PT Treatment/Interventions  ADLs/Self Care Home Management;Cryotherapy;Electrical Stimulation;Ultrasound;Traction;Moist Heat;Therapeutic activities;Therapeutic exercise;Patient/family education;Neuromuscular re-education;Manual techniques;Passive range of motion;Dry needling;Spinal Manipulations;Taping;Joint Manipulations    PT Next Visit Plan  manual therapy to address tissue mobility and chronic pain    PT Home Exercise Plan  review HEP issued at previous PT       Patient will benefit from skilled therapeutic intervention in order to improve the following deficits and impairments:  Decreased activity tolerance, Decreased strength, Decreased endurance, Decreased range of motion, Increased muscle spasms, Impaired flexibility, Impaired UE functional use, Postural dysfunction, Pain  Visit Diagnosis: Other headache syndrome  Cramp and spasm  Cervicalgia  Muscle weakness (generalized)     Problem List Patient Active Problem List   Diagnosis Date Noted  . Chronic migraine without aura, with intractable migraine, so  stated, with status migrainosus 04/30/2019  .  Laryngopharyngeal reflux (LPR) 10/30/2017  . Lung nodule 10/30/2017  . Right foot pain 11/04/2012  . Migraine with aura 02/06/2011  . CONSTIPATION, CHRONIC 07/11/2007  . 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 NAUSEA WITH VOMITING 06/13/2007  . 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 RESTLESS LEG SYNDROME 04/02/2007  . INSOMNIA 04/02/2007     Sigurd Sos, PT 10/05/19 12:22 PM  [facility] [address] [place], [place], [address] Phone: [phone]   Fax:  [phone]  Name: [name] [name] MRN: [id number] Date of Birth: 07-23-1965

## 2019-10-08 DIAGNOSIS — F4321 Adjustment disorder with depressed mood: Secondary | ICD-10-CM | POA: Diagnosis not present

## 2019-10-15 ENCOUNTER — Ambulatory Visit: Payer: BC Managed Care – PPO | Attending: Neurology

## 2019-10-15 ENCOUNTER — Other Ambulatory Visit: Payer: Self-pay

## 2019-10-15 DIAGNOSIS — M6281 Muscle weakness (generalized): Secondary | ICD-10-CM | POA: Insufficient documentation

## 2019-10-15 DIAGNOSIS — M542 Cervicalgia: Secondary | ICD-10-CM | POA: Insufficient documentation

## 2019-10-15 DIAGNOSIS — R252 Cramp and spasm: Secondary | ICD-10-CM

## 2019-10-15 DIAGNOSIS — G4489 Other headache syndrome: Secondary | ICD-10-CM | POA: Diagnosis not present

## 2019-10-15 NOTE — Therapy (Signed)
[facility] [address] [place], [place], [address] Phone: [phone]   Fax:  [phone]  Physical Therapy Treatment  Patient Details  Name: [name] [name] MRN: [id number] Date of Birth: 26-Mar-1966 Referring Provider (PT): Naomie Dean, MD   Encounter Date: 10/15/2019  PT End of Session - 10/15/19 0844    Visit Number  19    Date for PT Re-Evaluation  11/04/19    Authorization Type  Medicare/BCBS    PT Start Time  0800    PT Stop Time  0845    PT Time Calculation (min)  45 min    Activity Tolerance  Patient tolerated treatment well    Behavior During Therapy  [facility] for tasks assessed/performed       Past Medical History:  Diagnosis Date  . Anxiety   . Brain fog   . Chronic pain    COMPLEX REGIONAL PAIN SYNDROME  . Hemorrhoids   . History of neck problems    dry needling being done & PT  . Leg cramping 2020   right, says sometimes she has a hard time putting pressure on that leg   . Migraine headache     Past Surgical History:  Procedure Laterality Date  . CHOLECYSTECTOMY    . DILATION AND CURETTAGE OF UTERUS    . ESOPHAGEAL MANOMETRY N/A 05/05/2018   Procedure: ESOPHAGEAL MANOMETRY (EM);  Surgeon: Vida Rigger, MD;  Location: [facility];  Service: Endoscopy;  Laterality: N/A;  . SHOULDER ARTHROSCOPY Left 2008  . THORACOSCOPY    . TONSILLECTOMY AND ADENOIDECTOMY     AT AGE 38  . WISDOM TOOTH EXTRACTION      There were no vitals filed for this visit.  Subjective Assessment - 10/15/19 0804    Subjective  The botox has really helped.  The headaches have been really good-no migraines.    Currently in Pain?  Yes    Pain Score  2     Pain Location  Neck    Pain Orientation  Right;Left    Pain Descriptors / Indicators  Tightness    Pain Type  Chronic pain    Pain Onset  More than a month ago    Pain Frequency  Constant    Aggravating Factors   turning head to the Rt, reaching overhead    Pain Relieving Factors  heat, topical rub         [facility] PT Assessment - 10/15/19 0001      AROM   Cervical - Right Rotation  65    Cervical - Left Rotation  70                   [facility] Adult PT Treatment/Exercise - 10/15/19 0001      Shoulder Exercises: Prone   Other Prone Exercises  childs pose with thread the needle x2, cervical quadrant stretch      Manual Therapy   Manual Therapy  Soft tissue mobilization;Myofascial release    Manual therapy comments  thoracic and cervical musculature and PA mobs in thoracic spine       Trigger Point Dry Needling - 10/15/19 0001    Consent Given?  Yes    Muscles Treated Head and Neck  Upper trapezius    Muscles Treated Upper Quadrant  Rhomboids;Subscapularis    Upper Trapezius Response  Twitch reponse elicited;Palpable increased muscle length    Cervical multifidi Response  Twitch reponse elicited;Palpable increased muscle length    Rhomboids  Response  Palpable increased muscle length;Twitch response elicited    Subscapularis Response  Twitch response elicited;Palpable increased muscle length           PT Education - 10/15/19 0817    Education Details  Access Code: [email or web address]    Person(s) Educated  Patient    Methods  Explanation;Demonstration;Handout    Comprehension  Verbalized understanding;Returned demonstration       PT Short Term Goals - 08/13/19 0942      PT SHORT TERM GOAL #2   Title  report < or = to 4-5/10 neck pain with self-care and ADLs    Baseline  2-3/10 today    Time  4        PT Long Term Goals - 10/15/19 0803      PT LONG TERM GOAL #1   Title  be independent in advanced HEP    Time  6    Period  Weeks    Status  On-going            Plan - 10/15/19 3748    Clinical Impression Statement  Pt with 2 week lapse in treatment.  Pt had botox treatment for migraines and reports an overall reduction in migraine frequency and intensity.  Pt reports 2/10 Rt sided neck and upper trap pain  todayPt reports 80% overall improvement since the start of care and is independent and compliant with HEP for strength and flexibility. PT added HEP for thoracic and cervical segmental mobility. Pt with trigger points that are overall improved in the thoracic and cervical spine.  Pt demonstrated improved tissue mobility and reduced pain after manual therapy today.  Pt will continue to benefit from dry needling and manual therapy to manage chronic pain syndrome.    PT Frequency  1x / week    PT Duration  6 weeks    PT Treatment/Interventions  ADLs/Self Care Home Management;Cryotherapy;Electrical Stimulation;Ultrasound;Traction;Moist Heat;Therapeutic activities;Therapeutic exercise;Patient/family education;Neuromuscular re-education;Manual techniques;Passive range of motion;Dry needling;Spinal Manipulations;Taping;Joint Manipulations    PT Next Visit Plan  manual therapy to address tissue mobility and chronic pain.  Review HEP    PT Home Exercise Plan  Access Code: [email or web address]    Consulted and Agree with Plan of Care  Patient       Patient will benefit from skilled therapeutic intervention in order to improve the following deficits and impairments:  Decreased activity tolerance, Decreased strength, Decreased endurance, Decreased range of motion, Increased muscle spasms, Impaired flexibility, Impaired UE functional use, Postural dysfunction, Pain  Visit Diagnosis: Other headache syndrome  Cramp and spasm  Cervicalgia  Muscle weakness (generalized)     Problem List Patient Active Problem List   Diagnosis Date Noted  . Chronic migraine without aura, with intractable migraine, so stated, with status migrainosus 04/30/2019  . Laryngopharyngeal reflux (LPR) 10/30/2017  . Lung nodule 10/30/2017  . Right foot pain 11/04/2012  . Migraine with aura 02/06/2011  . CONSTIPATION, CHRONIC 07/11/2007  . 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 NAUSEA WITH VOMITING 06/13/2007  . PULMONARY  SARCOIDOSIS 04/02/2007  . RESTLESS LEG SYNDROME 04/02/2007  . INSOMNIA 04/02/2007     Sigurd Sos, PT 10/15/19 8:46 AM  [facility] [address], [place], [place], [address] Phone: [phone]   Fax:  [phone]  Name: [name] [name] MRN: [id number] Date of Birth: [birth year]

## 2019-10-15 NOTE — Patient Instructions (Signed)
Access Code: [email or web address] URL: [email or web address] Date: 10/15/2019 Prepared by: Tresa Endo  Exercises Cervical Rotation Prone on Elbows - 1 x daily - 7 x weekly - 1 sets - 10 reps - 3 hold Child's Pose with Thread the Needle - 2 x daily - 7 x weekly - 1 sets - 3 reps - 20 hold Sidelying Open Book Thoracic Rotation with Knee on Foam Roll - 3 x daily - 7 x weekly - 10 reps - 1 sets - 3 hold

## 2019-10-19 DIAGNOSIS — Z01419 Encounter for gynecological examination (general) (routine) without abnormal findings: Secondary | ICD-10-CM | POA: Diagnosis not present

## 2019-10-19 DIAGNOSIS — Z1231 Encounter for screening mammogram for malignant neoplasm of breast: Secondary | ICD-10-CM | POA: Diagnosis not present

## 2019-10-20 DIAGNOSIS — F902 Attention-deficit hyperactivity disorder, combined type: Secondary | ICD-10-CM | POA: Diagnosis not present

## 2019-10-20 DIAGNOSIS — F411 Generalized anxiety disorder: Secondary | ICD-10-CM | POA: Diagnosis not present

## 2019-10-22 ENCOUNTER — Ambulatory Visit: Payer: BC Managed Care – PPO

## 2019-11-02 DIAGNOSIS — F4321 Adjustment disorder with depressed mood: Secondary | ICD-10-CM | POA: Diagnosis not present

## 2019-11-03 ENCOUNTER — Ambulatory Visit: Payer: BC Managed Care – PPO

## 2019-11-03 ENCOUNTER — Other Ambulatory Visit: Payer: Self-pay

## 2019-11-03 DIAGNOSIS — M542 Cervicalgia: Secondary | ICD-10-CM

## 2019-11-03 DIAGNOSIS — M6281 Muscle weakness (generalized): Secondary | ICD-10-CM | POA: Diagnosis not present

## 2019-11-03 DIAGNOSIS — G4489 Other headache syndrome: Secondary | ICD-10-CM

## 2019-11-03 DIAGNOSIS — R252 Cramp and spasm: Secondary | ICD-10-CM

## 2019-11-03 NOTE — Therapy (Signed)
[facility] [address] [place], [place], [address] Phone: [phone]   Fax:  [phone]  Physical Therapy Treatment  Patient Details  Name: [name] [name] MRN: [id number] Date of Birth: May 25, 1966 Referring Provider (PT): Sarina Ill, MD   Encounter Date: 11/03/2019  PT End of Session - 11/03/19 0927    Visit Number  20    Authorization Type  Medicare/BCBS    PT Start Time  3244    PT Stop Time  0928    PT Time Calculation (min)  41 min    Activity Tolerance  Patient tolerated treatment well    Behavior During Therapy  [facility] for tasks assessed/performed       Past Medical History:  Diagnosis Date  . Anxiety   . Brain fog   . Chronic pain    COMPLEX REGIONAL PAIN SYNDROME  . Hemorrhoids   . History of neck problems    dry needling being done & PT  . Leg cramping 2020   right, says sometimes she has a hard time putting pressure on that leg   . Migraine headache     Past Surgical History:  Procedure Laterality Date  . CHOLECYSTECTOMY    . DILATION AND CURETTAGE OF UTERUS    . ESOPHAGEAL MANOMETRY N/A 05/05/2018   Procedure: ESOPHAGEAL MANOMETRY (EM);  Surgeon: Clarene Essex, MD;  Location: [facility];  Service: Endoscopy;  Laterality: N/A;  . SHOULDER ARTHROSCOPY Left 2008  . THORACOSCOPY    . TONSILLECTOMY AND ADENOIDECTOMY     AT AGE 89  . WISDOM TOOTH EXTRACTION      There were no vitals filed for this visit.      [facility] PT Assessment - 11/03/19 0001      Assessment   Medical Diagnosis  chronic migraine without aura, cervical myofascial pain syndrome    Referring Provider (PT)  Sarina Ill, MD      Prior Function   Level of Independence  Independent      Cognition   Overall Cognitive Status  Within Functional Limits for tasks assessed      Observation/Other Assessments   Focus on Therapeutic Outcomes (FOTO)   44% limitation      AROM   Cervical - Right Side Bend  50    Cervical - Left Side Bend  55    Cervical - Right Rotation  60    Cervical - Left Rotation  65                    [facility] Adult PT Treatment/Exercise - 11/03/19 0001      Manual Therapy   Manual Therapy  Soft tissue mobilization;Myofascial release    Manual therapy comments  thoracic and cervical musculature and PA mobs in thoracic spine       Trigger Point Dry Needling - 11/03/19 0001    Consent Given?  Yes    Muscles Treated Head and Neck  Upper trapezius    Muscles Treated Upper Quadrant  Rhomboids;Subscapularis    Upper Trapezius Response  Twitch reponse elicited;Palpable increased muscle length    Cervical multifidi Response  Twitch reponse elicited;Palpable increased muscle length    Rhomboids Response  Palpable increased muscle length;Twitch response elicited    Subscapularis Response  Twitch response elicited;Palpable increased muscle length             PT Short Term Goals - 08/13/19 0942      PT SHORT TERM GOAL #2  Title  report < or = to 4-5/10 neck pain with self-care and ADLs    Baseline  2-3/10 today    Time  4        PT Long Term Goals - 11/03/19 0859      PT LONG TERM GOAL #1   Title  be independent in advanced HEP    Status  Achieved      PT LONG TERM GOAL #2   Title  reduce FOTO to < or = to 44% limitation    Baseline  44%    Status  Achieved      PT LONG TERM GOAL #3   Title  demonstrate bil cervical A/ROM rotation to > or = to 75 degrees to improve safety with driving    Status  Partially Met      PT LONG TERM GOAL #4   Title  report < or =to  3/10 neck pain/thoracic pain with ADLs and self-care    Baseline  3/10    Status  Achieved      PT LONG TERM GOAL #5   Title  improve postural strength to wash dishes with 90 % less thoracic and cervical pain    Baseline  90%    Status  Achieved            Plan - 11/03/19 0902    Clinical Impression Statement  Pt is ready for D/C to HEP.  Pt reports 90% overall improvement  in neck and thoracic pain since the start of care.  Pt demonstrates improved cervical A/ROM in all directions.  Pt reports 3/10 neck and thoracic pain with daily tasks.  Pt demonstrates improve postural awareness and is making corrections at home.  Pt will D/C to HEP for postural strength and flexibility and follow-up with MD as needed.    PT Next Visit Plan  D/C PT to HEP today    PT Home Exercise Plan  Access Code: [email or web address]       Patient will benefit from skilled therapeutic intervention in order to improve the following deficits and impairments:     Visit Diagnosis: Cramp and spasm  Other headache syndrome  Cervicalgia  Muscle weakness (generalized)     Problem List Patient Active Problem List   Diagnosis Date Noted  . Chronic migraine without aura, with intractable migraine, so stated, with status migrainosus 04/30/2019  . Laryngopharyngeal reflux (LPR) 10/30/2017  . Lung nodule 10/30/2017  . Right foot pain 11/04/2012  . Migraine with aura 02/06/2011  . CONSTIPATION, CHRONIC 07/11/2007  . 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 NAUSEA WITH VOMITING 06/13/2007  . 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 RESTLESS LEG SYNDROME 04/02/2007  . INSOMNIA 04/02/2007    PHYSICAL THERAPY DISCHARGE SUMMARY  Visits from Start of Care: 20 Current functional level related to goals / functional outcomes: See above for current status.     Remaining deficits: Chronic neck pain/thoracic pain and migraines.  Pt is managing this well and pain is improved by 90%.     Education / Equipment: HEP, posture/body mechanics Plan: Patient agrees to discharge.  Patient goals were met. Patient is being discharged due to meeting the stated rehab goals.  ?????         Sigurd Sos, PT 11/03/19 9:29 AM  [facility] [address] [place], [place], [address] Phone: [phone]   Fax:  [phone]  Name: [name] [name] MRN: [id number] Date of Birth: [birth year]

## 2019-11-16 DIAGNOSIS — F4321 Adjustment disorder with depressed mood: Secondary | ICD-10-CM | POA: Diagnosis not present

## 2019-11-19 ENCOUNTER — Telehealth: Payer: Self-pay | Admitting: Neurology

## 2019-11-19 NOTE — Telephone Encounter (Signed)
I called [facility] and spoke with pharmacist Baird Lyons to give a verbal prescription because previous expired. 200U x 1 vial, 1 refill. She states she will call patient to get consent and reach back out to schedule.

## 2019-11-25 ENCOUNTER — Other Ambulatory Visit: Payer: Self-pay | Admitting: *Deleted

## 2019-11-25 MED ORDER — AMITRIPTYLINE HCL 25 MG PO TABS: ORAL_TABLET | ORAL | 0 refills | Status: DC

## 2019-11-26 DIAGNOSIS — Z118 Encounter for screening for other infectious and parasitic diseases: Secondary | ICD-10-CM | POA: Diagnosis not present

## 2019-11-26 DIAGNOSIS — Z202 Contact with and (suspected) exposure to infections with a predominantly sexual mode of transmission: Secondary | ICD-10-CM | POA: Diagnosis not present

## 2019-12-01 DIAGNOSIS — F4321 Adjustment disorder with depressed mood: Secondary | ICD-10-CM | POA: Diagnosis not present

## 2019-12-21 NOTE — Telephone Encounter (Signed)
I called [facility] [phone]) and I spoke with Helmut Muster to see about scheduling delivery of Botox. She states that the Botox patient needs (1 200U vial) is out of stock. She states it went out of stock on 6/30 and has not been replenished yet. She scheduled a tentative delivery for 7/15. If the medication is not back in stock, they will call us to let us know.

## 2019-12-22 DIAGNOSIS — F4321 Adjustment disorder with depressed mood: Secondary | ICD-10-CM | POA: Diagnosis not present

## 2019-12-23 DIAGNOSIS — J310 Chronic rhinitis: Secondary | ICD-10-CM | POA: Diagnosis not present

## 2019-12-24 NOTE — Telephone Encounter (Signed)
Received (1) 200U vial from [facility] today for patient's 7/20 appointment.

## 2019-12-29 ENCOUNTER — Other Ambulatory Visit: Payer: Self-pay

## 2019-12-29 ENCOUNTER — Ambulatory Visit (INDEPENDENT_AMBULATORY_CARE_PROVIDER_SITE_OTHER): Payer: Medicare Other | Admitting: Neurology

## 2019-12-29 DIAGNOSIS — G43711 Chronic migraine without aura, intractable, with status migrainosus: Secondary | ICD-10-CM | POA: Diagnosis not present

## 2019-12-29 NOTE — Progress Notes (Signed)
This is our second botox. She did great, > 80% improvement in headaches and migraines,  She has very tight masseters +15 each side. Also she does not tolerate increased botox in the  trap/cervical muscles but added +LS, the right side is worse. No side effects, she did well, if she has any forehead drooping we can move the botox higher but she did well, advised not to lay down for 4 hours and no rubbing face to avoid botox spread and ptosis. She is unfortunately getting divorced, her husband left her on Easter.   Consent Form Botulism Toxin Injection For Chronic Migraine    Reviewed orally with patient, additionally signature is on file:  Botulism toxin has been approved by the Federal drug administration for treatment of chronic migraine. Botulism toxin does not cure chronic migraine and it may not be effective in some patients.  The administration of botulism toxin is accomplished by injecting a small amount of toxin into the muscles of the neck and head. Dosage must be titrated for each individual. Any benefits resulting from botulism toxin tend to wear off after 3 months with a repeat injection required if benefit is to be maintained. Injections are usually done every 3-4 months with maximum effect peak achieved by about 2 or 3 weeks. Botulism toxin is expensive and you should be sure of what costs you will incur resulting from the injection.  The side effects of botulism toxin use for chronic migraine may include:   -Transient, and usually mild, facial weakness with facial injections  -Transient, and usually mild, head or neck weakness with head/neck injections  -Reduction or loss of forehead facial animation due to forehead muscle weakness  -Eyelid drooping  -Dry eye  -Pain at the site of injection or bruising at the site of injection  -Double vision  -Potential unknown long term risks  Contraindications: You should not have Botox if you are pregnant, nursing, allergic to albumin, have  an infection, skin condition, or muscle weakness at the site of the injection, or have myasthenia gravis, Lambert-Eaton syndrome, or ALS.  It is also possible that as with any injection, there may be an allergic reaction or no effect from the medication. Reduced effectiveness after repeated injections is sometimes seen and rarely infection at the injection site may occur. All care will be taken to prevent these side effects. If therapy is given over a long time, atrophy and wasting in the muscle injected may occur. Occasionally the patient's become refractory to treatment because they develop antibodies to the toxin. In this event, therapy needs to be modified.  I have read the above information and consent to the administration of botulism toxin.    BOTOX PROCEDURE NOTE FOR MIGRAINE HEADACHE    Contraindications and precautions discussed with patient(above). Aseptic procedure was observed and patient tolerated procedure. Procedure performed by Dr. Artemio Aly  The condition has existed for more than 6 months, and pt does not have a diagnosis of ALS, Myasthenia Gravis or Lambert-Eaton Syndrome.  Risks and benefits of injections discussed and pt agrees to proceed with the procedure.  Written consent obtained  These injections are medically necessary. Pt  receives good benefits from these injections. These injections do not cause sedations or hallucinations which the oral therapies may cause.  Description of procedure:  The patient was placed in a sitting position. The standard protocol was used for Botox as follows, with 5 units of Botox injected at each site:   -Procerus muscle, midline injection  -  Corrugator muscle, bilateral injection  -Frontalis muscle, bilateral injection, with 2 sites each side, medial injection was performed in the upper one third of the frontalis muscle, in the region vertical from the medial inferior edge of the superior orbital rim. The lateral injection was again  in the upper one third of the forehead vertically above the lateral limbus of the cornea, 1.5 cm lateral to the medial injection site.  -Temporalis muscle injection, 4 sites, bilaterally. The first injection was 3 cm above the tragus of the ear, second injection site was 1.5 cm to 3 cm up from the first injection site in line with the tragus of the ear. The third injection site was 1.5-3 cm forward between the first 2 injection sites. The fourth injection site was 1.5 cm posterior to the second injection site.   -Occipitalis muscle injection, 3 sites, bilaterally. The first injection was done one half way between the occipital protuberance and the tip of the mastoid process behind the ear. The second injection site was done lateral and superior to the first, 1 fingerbreadth from the first injection. The third injection site was 1 fingerbreadth superiorly and medially from the first injection site.  -Cervical paraspinal muscle injection, 2 sites, bilateral knee first injection site was 1 cm from the midline of the cervical spine, 3 cm inferior to the lower border of the occipital protuberance. The second injection site was 1.5 cm superiorly and laterally to the first injection site.  -Trapezius muscle injection was performed at 3 sites, bilaterally. The first injection site was in the upper trapezius muscle halfway between the inflection point of the neck, and the acromion. The second injection site was one half way between the acromion and the first injection site. The third injection was done between the first injection site and the inflection point of the neck.   Will return for repeat injection in 3 months.   200 units of Botox was used, any Botox not injected was wasted. The patient tolerated the procedure well, there were no complications of the above procedure.

## 2019-12-29 NOTE — Progress Notes (Signed)
Botox- 200 UnitsX1 Lot: [id number] Expiration: 09/2022 NDC: 2725-3664-40  Bacteriostatic 0.9% Sodium Chloride- 40mL total Lot: [id number] Expiration: 01/09/2020 NDC: 8756-4332-95  Dx: J88.416 S/P

## 2020-01-05 DIAGNOSIS — F4321 Adjustment disorder with depressed mood: Secondary | ICD-10-CM | POA: Diagnosis not present

## 2020-01-26 DIAGNOSIS — F4321 Adjustment disorder with depressed mood: Secondary | ICD-10-CM | POA: Diagnosis not present

## 2020-02-25 DIAGNOSIS — F4321 Adjustment disorder with depressed mood: Secondary | ICD-10-CM | POA: Diagnosis not present

## 2020-03-01 DIAGNOSIS — Z20828 Contact with and (suspected) exposure to other viral communicable diseases: Secondary | ICD-10-CM | POA: Diagnosis not present

## 2020-03-04 DIAGNOSIS — Z20822 Contact with and (suspected) exposure to covid-19: Secondary | ICD-10-CM | POA: Diagnosis not present

## 2020-03-04 DIAGNOSIS — Z03818 Encounter for observation for suspected exposure to other biological agents ruled out: Secondary | ICD-10-CM | POA: Diagnosis not present

## 2020-03-05 ENCOUNTER — Other Ambulatory Visit: Payer: Self-pay

## 2020-03-05 ENCOUNTER — Ambulatory Visit (HOSPITAL_COMMUNITY)
Admission: EM | Admit: 2020-03-05 | Discharge: 2020-03-05 | Disposition: A | Discharge status: Home / Self Care | Payer: BC Managed Care – PPO | Attending: Emergency Medicine | Admitting: Emergency Medicine

## 2020-03-05 ENCOUNTER — Encounter (HOSPITAL_COMMUNITY): Payer: Self-pay

## 2020-03-05 DIAGNOSIS — R509 Fever, unspecified: Secondary | ICD-10-CM | POA: Diagnosis not present

## 2020-03-05 DIAGNOSIS — B349 Viral infection, unspecified: Secondary | ICD-10-CM | POA: Insufficient documentation

## 2020-03-05 DIAGNOSIS — Z20822 Contact with and (suspected) exposure to covid-19: Secondary | ICD-10-CM | POA: Diagnosis not present

## 2020-03-05 LAB — POC INFLUENZA A AND B ANTIGEN (URGENT CARE ONLY)
Influenza A Ag: NEGATIVE
Influenza B Ag: NEGATIVE

## 2020-03-05 LAB — POCT RAPID STREP A, ED / UC: Streptococcus, Group A Screen (Direct): NEGATIVE

## 2020-03-05 LAB — SARS CORONAVIRUS 2 (TAT 6-24 HRS): SARS Coronavirus 2: POSITIVE — AB

## 2020-03-05 MED ORDER — ACETAMINOPHEN 325 MG PO TABS
ORAL_TABLET | ORAL | Status: AC
  Filled 2020-03-05: qty 2

## 2020-03-05 MED ORDER — ACETAMINOPHEN 325 MG PO TABS
650.0000 mg | ORAL_TABLET | Freq: Once | ORAL | Status: AC
  Administered 2020-03-05: 650 mg via ORAL

## 2020-03-05 NOTE — Discharge Instructions (Addendum)
Your rapid strep test is negative.  A throat culture is pending; we will call you if it is positive requiring treatment.    You flu test was negative.    Your COVID test is pending.  You should self quarantine until the test result is back.    Take Tylenol as needed for fever or discomfort.  Rest and keep yourself hydrated.    Go to the emergency department if you develop acute worsening symptoms.

## 2020-03-05 NOTE — ED Triage Notes (Signed)
Pt c/o nausea, body aches, sore throat and body achesx1 wk. Pt states on Monday her daughter tested + for COVID. Pt states she was tested for COVID 3 times in the past week and it was neg. Pt states Tues her test was neg. Pt did a rapid test yesterday and it was neg and home test was neg on Wed.

## 2020-03-05 NOTE — ED Provider Notes (Signed)
[facility]    CSN: [id number] Arrival date & time: 03/05/20  1026      History   Chief Complaint Chief Complaint  Patient presents with  . Fever    HPI [name] [name] is a 54 y.o. female.   Patient presents with fever, nausea, body aches, sore throat, diarrhea x1 week.  No diarrhea today.  No emesis.  T-max 100.5.  Patient took Tylenol yesterday but has not taken any medications today.  She denies rash, cough, shortness of breath, or other symptoms.  She had a negative PCR COVID on 03/01/2020 and 2 negative rapid tests this week.  Her daughter is COVID positive on 02/29/2020.  Patient states that she spoke with her physician this morning and was instructed to get a flu test.  Patient's medical history includes chronic pain, CRPS, chronic migraines, lung nodule, pulmonary sarcoidosis, restless leg syndrome, insomnia, anxiety, brain fog.  The history is provided by the patient.    Past Medical History:  Diagnosis Date  . Anxiety   . Brain fog   . Chronic pain    COMPLEX REGIONAL PAIN SYNDROME  . Hemorrhoids   . History of neck problems    dry needling being done & PT  . Leg cramping 2020   right, says sometimes she has a hard time putting pressure on that leg   . Migraine headache     Patient Active Problem List   Diagnosis Date Noted  . Chronic migraine without aura, with intractable migraine, so stated, with status migrainosus 04/30/2019  . Laryngopharyngeal reflux (LPR) 10/30/2017  . Lung nodule 10/30/2017  . Right foot pain 11/04/2012  . Migraine with aura 02/06/2011  . CONSTIPATION, CHRONIC 07/11/2007  . 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 NAUSEA WITH VOMITING 06/13/2007  . 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 RESTLESS LEG SYNDROME 04/02/2007  . INSOMNIA 04/02/2007    Past Surgical History:  Procedure Laterality Date  . CHOLECYSTECTOMY    . DILATION AND CURETTAGE OF UTERUS    . ESOPHAGEAL MANOMETRY N/A 05/05/2018   Procedure:  ESOPHAGEAL MANOMETRY (EM);  Surgeon: Vida Rigger, MD;  Location: [facility];  Service: Endoscopy;  Laterality: N/A;  . SHOULDER ARTHROSCOPY Left 2008  . THORACOSCOPY    . TONSILLECTOMY AND ADENOIDECTOMY     AT AGE 68  . WISDOM TOOTH EXTRACTION      OB History   No obstetric history on file.      Home Medications    Prior to Admission medications   Medication Sig Start Date End Date Taking? Authorizing Provider  ALPRAZolam Prudy Feeler) 0.5 MG tablet Take 0.5 mg by mouth at bedtime as needed for anxiety.    [provider]  amitriptyline (ELAVIL) 25 MG tablet TAKE 1 TABLET BY MOUTH 2 HOURS BEFORE BEDTIME. Patient taking differently: 50 mg. TAKE 2 TABLET BY MOUTH 2 HOURS BEFORE BEDTIME. 11/25/19   Anson Fret, MD  amoxicillin-clavulanate (AUGMENTIN) 875-125 MG tablet Take 1 tablet by mouth 2 (two) times daily. 04/30/19   Anson Fret, MD  Amphetamine ER (ADZENYS XR-ODT) 18.8 MG TBED Take 1 tablet by mouth daily.     [provider]  botulinum toxin Type A (BOTOX) 100 units SOLR injection Provider to inject 155 units into the muscles of the head and neck every 3 months. Discard remainder. 08/05/19   Anson Fret, MD  estradiol-norethindrone (ACTIVELLA) 1-0.5 MG tablet Take 1 tablet by mouth daily.    [provider]  fluticasone (FLONASE) 50 MCG/ACT  nasal spray Place 2 sprays into both nostrils daily.    [provider]  Rimegepant Sulfate (NURTEC) 75 MG TBDP Take 75 mg by mouth daily as needed. For migraines. Take as close to onset of migraine as possible. One daily maximum. 08/03/19   Anson Fret, MD  rizatriptan (MAXALT-MLT) 10 MG disintegrating tablet Take 1 tablet (10 mg total) by mouth as needed for migraine. May repeat in 2 hours if needed 08/03/19   Anson Fret, MD  sertraline (ZOLOFT) 100 MG tablet Take 100 mg by mouth daily.      [provider]  tiZANidine (ZANAFLEX) 4 MG tablet Take 1-2 tablets (4-8 mg total) by mouth  every 8 (eight) hours as needed. 08/03/19   Anson Fret, MD  TRAZODONE HCL PO Take 125 mg by mouth at bedtime.     [provider]    Family History Family History  Problem Relation Age of Onset  . Diabetes Mother        TYPE 2  . Hypertension Mother   . Cancer Mother        UTERINE CANCER  . Heart disease Father        HEART ATTACK  . Migraines Father     Social History Social History   Tobacco Use  . Smoking status: Never Smoker  . Smokeless tobacco: Never Used  Vaping Use  . Vaping Use: Never used  Substance Use Topics  . Alcohol use: Not Currently    Alcohol/week: 1.0 standard drink    Types: 1 Standard drinks or equivalent per week  . Drug use: No     Allergies   Levaquin [levofloxacin in d5w], Metoclopramide hcl, Penicillins, and Reglan [metoclopramide]   Review of Systems Review of Systems  Constitutional: Positive for fever. Negative for chills.  HENT: Positive for sore throat. Negative for ear pain.   Eyes: Negative for pain and visual disturbance.  Respiratory: Negative for cough and shortness of breath.   Cardiovascular: Negative for chest pain and palpitations.  Gastrointestinal: Positive for diarrhea and nausea. Negative for abdominal pain and vomiting.  Genitourinary: Negative for dysuria and hematuria.  Musculoskeletal: Negative for arthralgias and back pain.  Skin: Negative for color change and rash.  Neurological: Negative for seizures and syncope.  All other systems reviewed and are negative.    Physical Exam Triage Vital Signs ED Triage Vitals [03/05/20 1152]  Enc Vitals Group     BP      Pulse Rate 98     Resp 18     Temp (!) 100.5 F (38.1 C)     Temp Source Oral     SpO2 100 %     Weight      Height      Head Circumference      Peak Flow      Pain Score      Pain Loc      Pain Edu?      Excl. in GC?    No data found.  Updated Vital Signs BP 108/67   Pulse 98   Temp (!) 100.5 F (38.1 C) (Oral)   Resp 18    Ht 5\' 1"  (1.549 m)   Wt 103 lb (46.7 kg)   LMP 01/05/2011   SpO2 100%   BMI 19.46 kg/m   Visual Acuity Right Eye Distance:   Left Eye Distance:   Bilateral Distance:    Right Eye Near:   Left Eye Near:  Bilateral Near:     Physical Exam Vitals and nursing note reviewed.  Constitutional:      General: She is not in acute distress.    Appearance: She is well-developed. She is not ill-appearing.  HENT:     Head: Normocephalic and atraumatic.     Right Ear: Tympanic membrane normal.     Left Ear: Tympanic membrane normal.     Nose: Nose normal.     Mouth/Throat:     Mouth: Mucous membranes are moist.     Pharynx: Oropharynx is clear.  Eyes:     Conjunctiva/sclera: Conjunctivae normal.  Cardiovascular:     Rate and Rhythm: Normal rate and regular rhythm.     Heart sounds: No murmur heard.   Pulmonary:     Effort: Pulmonary effort is normal. No respiratory distress.     Breath sounds: Normal breath sounds.  Abdominal:     Palpations: Abdomen is soft.     Tenderness: There is no abdominal tenderness. There is no guarding or rebound.  Musculoskeletal:     Cervical back: Neck supple.  Skin:    General: Skin is warm and dry.     Findings: No rash.  Neurological:     General: No focal deficit present.     Mental Status: She is alert and oriented to person, place, and time.     Gait: Gait normal.  Psychiatric:        Mood and Affect: Mood normal.        Behavior: Behavior normal.      UC Treatments / Results  Labs (all labs ordered are listed, but only abnormal results are displayed) Labs Reviewed  SARS CORONAVIRUS 2 (TAT 6-24 HRS)  CULTURE, GROUP A STREP ([facility])  INFLUENZA PANEL BY PCR (TYPE A & B)  POCT RAPID STREP A, ED / UC    EKG   Radiology No results found.  Procedures Procedures (including critical care time)  Medications Ordered in UC Medications  acetaminophen (TYLENOL) tablet 650 mg (650 mg Oral Given 03/05/20 1231)    Initial  Impression / Assessment and Plan / UC Course  I have reviewed the triage vital signs and the nursing notes.  Pertinent labs & imaging results that were available during my care of the patient were reviewed by me and considered in my medical decision making (see chart for details).   Exposure to COVID. Viral illness.   Rapid strep negative; culture pending.  Flu negative.  PCR COVID pending.  PCR COVID pending.  Instructed patient to self quarantine until the test result is back.  Discussed symptomatic treatment including Tylenol, rest, hydration.  Instructed patient to go to the ED if she has acute worsening symptoms.  Patient agrees to plan of care.    Final Clinical Impressions(s) / UC Diagnoses   Final diagnoses:  Exposure to COVID-19 virus  Viral illness     Discharge Instructions     Your rapid strep test is negative.  A throat culture is pending; we will call you if it is positive requiring treatment.    You flu test was negative.    Your COVID test is pending.  You should self quarantine until the test result is back.    Take Tylenol as needed for fever or discomfort.  Rest and keep yourself hydrated.    Go to the emergency department if you develop acute worsening symptoms.        ED Prescriptions    None  PDMP not reviewed this encounter.   Mickie Bail, NP 03/05/20 1302

## 2020-03-07 ENCOUNTER — Other Ambulatory Visit: Payer: Self-pay | Admitting: Internal Medicine

## 2020-03-07 DIAGNOSIS — U071 COVID-19: Secondary | ICD-10-CM

## 2020-03-07 DIAGNOSIS — D869 Sarcoidosis, unspecified: Secondary | ICD-10-CM

## 2020-03-07 LAB — CULTURE, GROUP A STREP (THRC)

## 2020-03-07 NOTE — Progress Notes (Signed)
I connected by phone with Fran Lowes on 03/07/2020 at 12:19 PM to discuss the potential use of a new treatment for mild to moderate COVID-19 viral infection in non-hospitalized patients.  This patient is a 54 y.o. female that meets the FDA criteria for Emergency Use Authorization of COVID monoclonal antibody casirivimab/imdevimab or bamlanivimab/eteseviamb.  Has a (+) direct SARS-CoV-2 viral test result  Has mild or moderate COVID-19   Is NOT hospitalized due to COVID-19  Is within 10 days of symptom onset  Has at least one of the high risk factor(s) for progression to severe COVID-19 and/or hospitalization as defined in EUA.  Specific high risk criteria : Chronic Lung Disease   I have spoken and communicated the following to the patient or parent/caregiver regarding COVID monoclonal antibody treatment:  1. FDA has authorized the emergency use for the treatment of mild to moderate COVID-19 in adults and pediatric patients with positive results of direct SARS-CoV-2 viral testing who are 50 years of age and older weighing at least 40 kg, and who are at high risk for progressing to severe COVID-19 and/or hospitalization.  2. The significant known and potential risks and benefits of COVID monoclonal antibody, and the extent to which such potential risks and benefits are unknown.  3. Information on available alternative treatments and the risks and benefits of those alternatives, including clinical trials.  4. Patients treated with COVID monoclonal antibody should continue to self-isolate and use infection control measures (e.g., wear mask, isolate, social distance, avoid sharing personal items, clean and disinfect "high touch" surfaces, and frequent handwashing) according to CDC guidelines.   5. The patient or parent/caregiver has the option to accept or refuse COVID monoclonal antibody treatment.  After reviewing this information with the patient, the patient has agreed to receive  one of the available covid 19 monoclonal antibodies and will be provided an appropriate fact sheet prior to infusion.  Cyndee Brightly, NP [facility]

## 2020-03-08 ENCOUNTER — Ambulatory Visit (HOSPITAL_COMMUNITY)
Admission: RE | Admit: 2020-03-08 | Discharge: 2020-03-08 | Disposition: A | Discharge status: Home / Self Care | Payer: BC Managed Care – PPO | Source: Ambulatory Visit | Attending: Pulmonary Disease | Admitting: Pulmonary Disease

## 2020-03-08 DIAGNOSIS — D869 Sarcoidosis, unspecified: Secondary | ICD-10-CM | POA: Diagnosis not present

## 2020-03-08 DIAGNOSIS — U071 COVID-19: Secondary | ICD-10-CM | POA: Diagnosis not present

## 2020-03-08 DIAGNOSIS — Z23 Encounter for immunization: Secondary | ICD-10-CM | POA: Insufficient documentation

## 2020-03-08 MED ORDER — EPINEPHRINE 0.3 MG/0.3ML IJ SOAJ: 0.3000 mg | Freq: Once | INTRAMUSCULAR | Status: DC | PRN

## 2020-03-08 MED ORDER — SODIUM CHLORIDE 0.9 % IV SOLN
1200.0000 mg | Freq: Once | INTRAVENOUS | Status: AC
  Administered 2020-03-08: 1200 mg via INTRAVENOUS

## 2020-03-08 MED ORDER — FAMOTIDINE IN NACL 20-0.9 MG/50ML-% IV SOLN: 20.0000 mg | Freq: Once | INTRAVENOUS | Status: DC | PRN

## 2020-03-08 MED ORDER — ALBUTEROL SULFATE HFA 108 (90 BASE) MCG/ACT IN AERS: 2.0000 | INHALATION_SPRAY | Freq: Once | RESPIRATORY_TRACT | Status: DC | PRN

## 2020-03-08 MED ORDER — SODIUM CHLORIDE 0.9 % IV SOLN: INTRAVENOUS | Status: DC | PRN

## 2020-03-08 MED ORDER — METHYLPREDNISOLONE SODIUM SUCC 125 MG IJ SOLR: 125.0000 mg | Freq: Once | INTRAMUSCULAR | Status: DC | PRN

## 2020-03-08 MED ORDER — DIPHENHYDRAMINE HCL 50 MG/ML IJ SOLN: 50.0000 mg | Freq: Once | INTRAMUSCULAR | Status: DC | PRN

## 2020-03-08 NOTE — Discharge Instructions (Signed)

## 2020-03-08 NOTE — Progress Notes (Signed)
  Diagnosis: COVID-19  Physician:Dr Wright  Procedure: [facility] Med: casirivimab\imdevimab infusion - Provided patient with casirivimab\imdevimab fact sheet for patients, parents and caregivers prior to infusion.  Complications: No immediate complications noted.  Discharge: Discharged home   [name] [name] 03/08/2020  

## 2020-03-14 ENCOUNTER — Telehealth: Payer: Self-pay | Admitting: Neurology

## 2020-03-14 NOTE — Telephone Encounter (Signed)
Patient has a Botox appointment on 11/2. Her BCBS Botox PA expired today. I filled out BCBS continuation PA and gave to MD to sign.

## 2020-03-15 NOTE — Telephone Encounter (Signed)
Faxed signed BCBS continuation form.

## 2020-03-20 ENCOUNTER — Other Ambulatory Visit: Payer: Self-pay | Admitting: Neurology

## 2020-03-22 NOTE — Telephone Encounter (Signed)
Received BCBS approval via fax. PA #[id number] (03/15/20- 03/14/21). Patient uses [facility]. I called and spoke with Annabelle Harman to give her this updated PA information. Annabelle Harman had to send PA information to the major medical review team for benefit verification.

## 2020-03-30 DIAGNOSIS — F4323 Adjustment disorder with mixed anxiety and depressed mood: Secondary | ICD-10-CM | POA: Diagnosis not present

## 2020-03-30 DIAGNOSIS — G43711 Chronic migraine without aura, intractable, with status migrainosus: Secondary | ICD-10-CM | POA: Diagnosis not present

## 2020-03-30 MED ORDER — BOTOX 100 UNITS IJ SOLR: INTRAMUSCULAR | 2 refills | Status: DC

## 2020-03-30 NOTE — Telephone Encounter (Signed)
Received a fax from [facility]. Botox TBD 10/21.  Toma Copier, can you send more Botox refills to the specialty pharmacy?

## 2020-03-30 NOTE — Addendum Note (Signed)
Addended by: Bertram Savin on: 03/30/2020 08:07 AM   Modules accepted: Orders

## 2020-03-30 NOTE — Telephone Encounter (Signed)
Done

## 2020-03-31 NOTE — Telephone Encounter (Signed)
(  1) 200U vial of Botox delivered today from [facility] for patient's 11/2 appointment.

## 2020-04-06 DIAGNOSIS — F4323 Adjustment disorder with mixed anxiety and depressed mood: Secondary | ICD-10-CM | POA: Diagnosis not present

## 2020-04-12 ENCOUNTER — Ambulatory Visit: Payer: Medicare Other | Admitting: Neurology

## 2020-04-12 DIAGNOSIS — F4323 Adjustment disorder with mixed anxiety and depressed mood: Secondary | ICD-10-CM | POA: Diagnosis not present

## 2020-04-26 ENCOUNTER — Emergency Department (HOSPITAL_COMMUNITY)
Admission: EM | Admit: 2020-04-26 | Discharge: 2020-04-26 | Disposition: A | Discharge status: Home / Self Care | Payer: BC Managed Care – PPO | Attending: Emergency Medicine | Admitting: Emergency Medicine

## 2020-04-26 ENCOUNTER — Other Ambulatory Visit: Payer: Self-pay

## 2020-04-26 ENCOUNTER — Encounter (HOSPITAL_COMMUNITY): Payer: Self-pay

## 2020-04-26 ENCOUNTER — Emergency Department (HOSPITAL_COMMUNITY): Payer: BC Managed Care – PPO

## 2020-04-26 ENCOUNTER — Ambulatory Visit (HOSPITAL_COMMUNITY)
Admission: EM | Admit: 2020-04-26 | Discharge: 2020-04-26 | Payer: BC Managed Care – PPO | Attending: Family Medicine | Admitting: Family Medicine

## 2020-04-26 DIAGNOSIS — R1032 Left lower quadrant pain: Secondary | ICD-10-CM | POA: Diagnosis not present

## 2020-04-26 DIAGNOSIS — R102 Pelvic and perineal pain: Secondary | ICD-10-CM | POA: Diagnosis not present

## 2020-04-26 DIAGNOSIS — R109 Unspecified abdominal pain: Secondary | ICD-10-CM | POA: Diagnosis not present

## 2020-04-26 HISTORY — DX: Sarcoidosis, unspecified: D86.9

## 2020-04-26 LAB — COMPREHENSIVE METABOLIC PANEL
ALT: 16 U/L (ref 0–44)
AST: 29 U/L (ref 15–41)
Albumin: 4.7 g/dL (ref 3.5–5.0)
Alkaline Phosphatase: 49 U/L (ref 38–126)
Anion gap: 10 (ref 5–15)
BUN: 15 mg/dL (ref 6–20)
CO2: 28 mmol/L (ref 22–32)
Calcium: 9.5 mg/dL (ref 8.9–10.3)
Chloride: 100 mmol/L (ref 98–111)
Creatinine, Ser: 0.81 mg/dL (ref 0.44–1.00)
GFR, Estimated: >60 mL/min (ref >60)
Glucose, Bld: 93 mg/dL (ref 70–99)
Potassium: 5 mmol/L (ref 3.5–5.1)
Sodium: 138 mmol/L (ref 135–145)
Total Bilirubin: 1.1 mg/dL (ref 0.3–1.2)
Total Protein: 8 g/dL (ref 6.5–8.1)

## 2020-04-26 LAB — URINALYSIS, ROUTINE W REFLEX MICROSCOPIC
Bilirubin Urine: NEGATIVE
Glucose, UA: NEGATIVE mg/dL
Ketones, ur: NEGATIVE mg/dL
Leukocytes,Ua: NEGATIVE
Nitrite: NEGATIVE
Protein, ur: NEGATIVE mg/dL
Specific Gravity, Urine: 1.008 (ref 1.005–1.030)
pH: 6 (ref 5.0–8.0)

## 2020-04-26 LAB — POCT URINALYSIS DIPSTICK, ED / UC
Bilirubin Urine: NEGATIVE
Glucose, UA: NEGATIVE mg/dL
Ketones, ur: NEGATIVE mg/dL
Leukocytes,Ua: NEGATIVE
Nitrite: NEGATIVE
Protein, ur: NEGATIVE mg/dL
Specific Gravity, Urine: 1.02 (ref 1.005–1.030)
Urobilinogen, UA: 0.2 mg/dL (ref 0.0–1.0)
pH: 5 (ref 5.0–8.0)

## 2020-04-26 LAB — LIPASE, BLOOD: Lipase: 27 U/L (ref 11–51)

## 2020-04-26 LAB — CBC
HCT: 46.8 % — ABNORMAL HIGH (ref 36.0–46.0)
Hemoglobin: 14.8 g/dL (ref 12.0–15.0)
MCH: 29.1 pg (ref 26.0–34.0)
MCHC: 31.6 g/dL (ref 30.0–36.0)
MCV: 92.1 fL (ref 80.0–100.0)
Platelets: 221 10*3/uL (ref 150–400)
RBC: 5.08 MIL/uL (ref 3.87–5.11)
RDW: 12.2 % (ref 11.5–15.5)
WBC: 7 10*3/uL (ref 4.0–10.5)
nRBC: 0 % (ref 0.0–0.2)

## 2020-04-26 MED ORDER — HYDROCODONE-ACETAMINOPHEN 5-325 MG PO TABS
1.0000 | ORAL_TABLET | Freq: Four times a day (QID) | ORAL | 0 refills | Status: DC | PRN
Start: 2020-04-26 — End: 2020-08-09

## 2020-04-26 MED ORDER — KETOROLAC TROMETHAMINE 30 MG/ML IJ SOLN
15.0000 mg | Freq: Once | INTRAMUSCULAR | Status: AC
  Administered 2020-04-26: 15 mg via INTRAVENOUS
  Filled 2020-04-26: qty 1

## 2020-04-26 MED ORDER — SODIUM CHLORIDE (PF) 0.9 % IJ SOLN
INTRAMUSCULAR | Status: AC
  Administered 2020-04-26: 3 mL
  Filled 2020-04-26: qty 50

## 2020-04-26 MED ORDER — ACETAMINOPHEN 500 MG PO TABS
1000.0000 mg | ORAL_TABLET | Freq: Once | ORAL | Status: AC
  Administered 2020-04-26: 1000 mg via ORAL
  Filled 2020-04-26: qty 2

## 2020-04-26 MED ORDER — SODIUM CHLORIDE 0.9 % IV BOLUS
1000.0000 mL | Freq: Once | INTRAVENOUS | Status: AC
  Administered 2020-04-26: 1000 mL via INTRAVENOUS

## 2020-04-26 MED ORDER — IOHEXOL 300 MG/ML  SOLN
100.0000 mL | Freq: Once | INTRAMUSCULAR | Status: AC | PRN
  Administered 2020-04-26: 100 mL via INTRAVENOUS

## 2020-04-26 NOTE — ED Provider Notes (Addendum)
[facility]    CSN: [id number] Arrival date & time: 04/26/20  1117      History   Chief Complaint Chief Complaint  Patient presents with  . Groin Pain    HPI [name] [name] is a 54 y.o. female.   Patient is a 54 year old female past medical history of anxiety, chronic pain, hemorrhoids, migraines, sarcoidosis, constipation, IBS .  She presents today with approximately 1 week of worsening left groin, pelvic pain, radiates some in to lower back area.  The pain is worse with certain movements to include going from lying to sitting, bending, reaching, coughing, walking and going upstairs.  She has not noticed any swelling, bruising or erythema to the area.  Denies any trouble with urination to include dysuria, hematuria or urinary frequency.  Denies any fevers. She is been taking Tylenol, ibuprofen, diclofenac gel and using ice without any relief at all.  Thought maybe she had pulled a muscle due to it starting not too long after lifting up a package on her front porch that was a vacuum cleaner.  She is mildly tachycardic here today.  Appears to be uncomfortable.     Past Medical History:  Diagnosis Date  . Anxiety   . Brain fog   . Chronic pain    COMPLEX REGIONAL PAIN SYNDROME  . Hemorrhoids   . History of neck problems    dry needling being done & PT  . Leg cramping 2020   right, says sometimes she has a hard time putting pressure on that leg   . Migraine headache   . Sarcoidosis     Patient Active Problem List   Diagnosis Date Noted  . Chronic migraine without aura, with intractable migraine, so stated, with status migrainosus 04/30/2019  . Laryngopharyngeal reflux (LPR) 10/30/2017  . Lung nodule 10/30/2017  . Right foot pain 11/04/2012  . Migraine with aura 02/06/2011  . CONSTIPATION, CHRONIC 07/11/2007  . 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 NAUSEA WITH VOMITING 06/13/2007  . 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 RESTLESS LEG SYNDROME  04/02/2007  . INSOMNIA 04/02/2007    Past Surgical History:  Procedure Laterality Date  . CHOLECYSTECTOMY    . DILATION AND CURETTAGE OF UTERUS    . ESOPHAGEAL MANOMETRY N/A 05/05/2018   Procedure: ESOPHAGEAL MANOMETRY (EM);  Surgeon: Vida Rigger, MD;  Location: [facility];  Service: Endoscopy;  Laterality: N/A;  . SHOULDER ARTHROSCOPY Left 2008  . THORACOSCOPY    . TONSILLECTOMY AND ADENOIDECTOMY     AT AGE 54  . WISDOM TOOTH EXTRACTION      OB History   No obstetric history on file.      Home Medications    Prior to Admission medications   Medication Sig Start Date End Date Taking? Authorizing Provider  ALPRAZolam Prudy Feeler) 0.5 MG tablet Take 0.5 mg by mouth at bedtime as needed for anxiety.   Yes [provider]  Amphetamine ER (ADZENYS XR-ODT) 18.8 MG TBED Take 1 tablet by mouth daily.    Yes [provider]  estradiol-norethindrone (ACTIVELLA) 1-0.5 MG tablet Take 1 tablet by mouth daily.   Yes [provider]  fluticasone (FLONASE) 50 MCG/ACT nasal spray Place 2 sprays into both nostrils daily.   Yes [provider]  sertraline (ZOLOFT) 100 MG tablet Take 100 mg by mouth daily.     Yes [provider]  tiZANidine (ZANAFLEX) 4 MG tablet Take 1-2 tablets (4-8 mg total) by mouth every 8 (eight) hours as needed. 08/03/19  Yes Anson Fret, MD  TRAZODONE HCL PO Take 125 mg by mouth at bedtime.    Yes [provider]  botulinum toxin Type A (BOTOX) 100 units SOLR injection Provider to inject 155 units into the muscles of the head and neck every 3 months. Discard remainder. 03/30/20   Anson Fret, MD  rizatriptan (MAXALT-MLT) 10 MG disintegrating tablet Take 1 tablet (10 mg total) by mouth as needed for migraine. May repeat in 2 hours if needed 08/03/19   Anson Fret, MD  amitriptyline (ELAVIL) 25 MG tablet TAKE 1 TABLET BY MOUTH 2 HOURS BEFORE BEDTIME. Patient not taking: Reported on 04/26/2020 03/21/20 04/26/20   Anson Fret, MD    Family History Family History  Problem Relation Age of Onset  . Diabetes Mother        TYPE 2  . Hypertension Mother   . Cancer Mother        UTERINE CANCER  . Heart disease Father        HEART ATTACK  . Migraines Father     Social History Social History   Tobacco Use  . Smoking status: Never Smoker  . Smokeless tobacco: Never Used  Vaping Use  . Vaping Use: Never used  Substance Use Topics  . Alcohol use: Not Currently    Alcohol/week: 1.0 standard drink    Types: 1 Standard drinks or equivalent per week  . Drug use: No     Allergies   Levaquin [levofloxacin in d5w], Metoclopramide hcl, Penicillins, and Reglan [metoclopramide]   Review of Systems Review of Systems   Physical Exam Triage Vital Signs ED Triage Vitals  Enc Vitals Group     BP 04/26/20 1147 109/83     Pulse Rate 04/26/20 1147 (!) 112     Resp 04/26/20 1147 16     Temp 04/26/20 1147 97.8 F (36.6 C)     Temp Source 04/26/20 1147 Oral     SpO2 04/26/20 1147 100 %     Weight --      Height --      Head Circumference --      Peak Flow --      Pain Score 04/26/20 1148 7     Pain Loc --      Pain Edu? --      Excl. in GC? --    No data found.  Updated Vital Signs BP 109/83   Pulse (!) 112   Temp 97.8 F (36.6 C) (Oral)   Resp 16   LMP 01/05/2011   SpO2 100%   Visual Acuity Right Eye Distance:   Left Eye Distance:   Bilateral Distance:    Right Eye Near:   Left Eye Near:    Bilateral Near:     Physical Exam Abdominal:       Comments: Moderately tender to palpation, more with deep palpation. Some pain in the LLQ but more severe in pelvic area.  No obvious bruising, swelling, deformities.  No discoloration. No lymphadenopathy No hernia palpated.        UC Treatments / Results  Labs (all labs ordered are listed, but only abnormal results are displayed) Labs Reviewed  POCT URINALYSIS DIPSTICK, ED / UC - Abnormal; Notable for the following  components:      Result Value   Hgb urine dipstick TRACE (*)    All other components within normal limits    EKG   Radiology No results found.  Procedures Procedures (including critical  care time)  Medications Ordered in UC Medications - No data to display  Initial Impression / Assessment and Plan / UC Course  I have reviewed the triage vital signs and the nursing notes.  Pertinent labs & imaging results that were available during my care of the patient were reviewed by me and considered in my medical decision making (see chart for details).     Groin pain/left pelvic pain. Patient with worsening pain over the past week. Differentials include hernia versus incarcerated hernia versus muscle strain versus muscle spasm versus ovarian torsion. Patient appears to be very uncomfortable.  She is having pain upon deep palpation.  There are no obvious hernias on exam or bulges.  Skin appears normal without erythema, bruising, or discoloration. Urine with trace blood but otherwise no urinary tract infection Some pain in the LLQ but more severe lower. Another possibility could be diverticulitis. No fever, N,V,D. Does have IBS and goes from diarrhea to constipation. Sees GI for this.  Recommend ultrasound to rule out ovarian torsion.  Patient agrees and will go to the ER for further evaluation Final Clinical Impressions(s) / UC Diagnoses   Final diagnoses:  Groin pain, left  LLQ pain     Discharge Instructions     I am concerned for ovarian torsion I would like for you to go to the ER for an ultrasound.  Follow up as needed for continued or worsening symptoms     ED Prescriptions    None     PDMP not reviewed this encounter.      Janace Aris, NP 04/26/20 1417

## 2020-04-26 NOTE — ED Provider Notes (Signed)
[facility]EMERGENCY [facility]PT Provider Note   CSN: [id number] Arrival date & time: 04/26/20  1325     History Chief Complaint  Patient presents with  . Abdominal Pain    [name] [name] is a 54 y.o. female who presents to the ED today with complaint of gradual onset, constant, sharp, LLQ radiating into left flank x 1 week. Pt reports last week she was lifting a vacuum cleaner into her house and did not think much of it as it was not particularly very heavy. The next day she began having a sharp pain in the above mentioned area that has persisted. She has also had some mild diarrhea. She has been taking OTC meds without much relief and went to UC today for eval. Her U/A at UC had trace hgb in it; she does report hx of "bleeding hemorrhoids" and states the blood could be from that; no vaginal bleeding or hematuria appreciated by patient. She was sent from UC with concern for possible ovarian torsion. Pt denies fevers, chills, nausea, vomiting, urinary sx, vaginal discharge, or any other associated symptoms. She is not sexually active currently; separated from her husband and has not had intercourse in 6 months. She is unconcerned regarding STIs. PSHx includes cholecystectomy. No history of kidney stones. Colonoscopy recently with findings of diverticulosis.   The history is provided by the patient and medical records.       Past Medical History:  Diagnosis Date  . Anxiety   . Brain fog   . Chronic pain    COMPLEX REGIONAL PAIN SYNDROME  . Hemorrhoids   . History of neck problems    dry needling being done & PT  . Leg cramping 2020   right, says sometimes she has a hard time putting pressure on that leg   . Migraine headache   . Sarcoidosis     Patient Active Problem List   Diagnosis Date Noted  . Chronic migraine without aura, with intractable migraine, so stated, with status migrainosus 04/30/2019  . Laryngopharyngeal reflux (LPR) 10/30/2017  . Lung nodule  10/30/2017  . Right foot pain 11/04/2012  . Migraine with aura 02/06/2011  . CONSTIPATION, CHRONIC 07/11/2007  . 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 NAUSEA WITH VOMITING 06/13/2007  . 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 RESTLESS LEG SYNDROME 04/02/2007  . INSOMNIA 04/02/2007    Past Surgical History:  Procedure Laterality Date  . CHOLECYSTECTOMY    . DILATION AND CURETTAGE OF UTERUS    . ESOPHAGEAL MANOMETRY N/A 05/05/2018   Procedure: ESOPHAGEAL MANOMETRY (EM);  Surgeon: Vida Rigger, MD;  Location: [facility];  Service: Endoscopy;  Laterality: N/A;  . SHOULDER ARTHROSCOPY Left 2008  . THORACOSCOPY    . TONSILLECTOMY AND ADENOIDECTOMY     AT AGE 32  . WISDOM TOOTH EXTRACTION       OB History   No obstetric history on file.     Family History  Problem Relation Age of Onset  . Diabetes Mother        TYPE 2  . Hypertension Mother   . Cancer Mother        UTERINE CANCER  . Heart disease Father        HEART ATTACK  . Migraines Father     Social History   Tobacco Use  . Smoking status: Never Smoker  . Smokeless tobacco: Never Used  Vaping Use  . Vaping Use: Never used  Substance Use Topics  . Alcohol use: Not Currently  Alcohol/week: 1.0 standard drink    Types: 1 Standard drinks or equivalent per week  . Drug use: No    Home Medications Prior to Admission medications   Medication Sig Start Date End Date Taking? Authorizing Provider  ALPRAZolam Prudy Feeler) 0.5 MG tablet Take 0.5 mg by mouth at bedtime as needed for anxiety.    [provider]  Amphetamine ER (ADZENYS XR-ODT) 18.8 MG TBED Take 1 tablet by mouth daily.     [provider]  botulinum toxin Type A (BOTOX) 100 units SOLR injection Provider to inject 155 units into the muscles of the head and neck every 3 months. Discard remainder. 03/30/20   Anson Fret, MD  estradiol-norethindrone (ACTIVELLA) 1-0.5 MG tablet Take 1 tablet by mouth daily.    [provider]  fluticasone (FLONASE) 50 MCG/ACT nasal spray Place 2 sprays into both nostrils daily.    [provider]  rizatriptan (MAXALT-MLT) 10 MG disintegrating tablet Take 1 tablet (10 mg total) by mouth as needed for migraine. May repeat in 2 hours if needed 08/03/19   Anson Fret, MD  sertraline (ZOLOFT) 100 MG tablet Take 100 mg by mouth daily.      [provider]  tiZANidine (ZANAFLEX) 4 MG tablet Take 1-2 tablets (4-8 mg total) by mouth every 8 (eight) hours as needed. 08/03/19   Anson Fret, MD  TRAZODONE HCL PO Take 125 mg by mouth at bedtime.     [provider]  amitriptyline (ELAVIL) 25 MG tablet TAKE 1 TABLET BY MOUTH 2 HOURS BEFORE BEDTIME. Patient not taking: Reported on 04/26/2020 03/21/20 04/26/20  Anson Fret, MD    Allergies    Levaquin [levofloxacin in d5w], Metoclopramide hcl, Penicillins, and Reglan [metoclopramide]  Review of Systems   Review of Systems  Constitutional: Negative for chills and fever.  Gastrointestinal: Positive for abdominal pain and diarrhea. Negative for constipation, nausea and vomiting.  Genitourinary: Positive for flank pain. Negative for dysuria, frequency, vaginal bleeding and vaginal discharge.  All other systems reviewed and are negative.   Physical Exam Updated Vital Signs BP 114/82 (BP Location: Right Arm)   Pulse (!) 106   Temp 98.7 F (37.1 C) (Oral)   Resp 18   Ht 5\' 1"  (1.549 m)   Wt 45.8 kg   LMP 01/05/2011   SpO2 100%   BMI 19.08 kg/m   Physical Exam Vitals and nursing note reviewed.  Constitutional:      Appearance: She is not ill-appearing or diaphoretic.  HENT:     Head: Normocephalic and atraumatic.  Eyes:     Conjunctiva/sclera: Conjunctivae normal.  Cardiovascular:     Rate and Rhythm: Normal rate and regular rhythm.     Heart sounds: Normal heart sounds.  Pulmonary:     Effort: Pulmonary effort is normal.     Breath sounds: Normal breath sounds. No wheezing, rhonchi or  rales.  Abdominal:     General: Abdomen is flat.     Palpations: Abdomen is soft.     Tenderness: There is abdominal tenderness in the left lower quadrant. There is no guarding or rebound.     Comments: + left flank TTP  Genitourinary:    Comments: Deferred pelvic exam Musculoskeletal:     Cervical back: Neck supple.  Skin:    General: Skin is warm and dry.  Neurological:     Mental Status: She is alert.     ED Results / Procedures / Treatments   Labs (  all labs ordered are listed, but only abnormal results are displayed) Labs Reviewed  LIPASE, BLOOD  COMPREHENSIVE METABOLIC PANEL  URINALYSIS, ROUTINE W REFLEX MICROSCOPIC  CBC    EKG None  Radiology No results found.  Procedures Procedures (including critical care time)  Medications Ordered in ED Medications  sodium chloride 0.9 % bolus 1,000 mL (has no administration in time range)  ketorolac (TORADOL) 30 MG/ML injection 15 mg (has no administration in time range)    ED Course  I have reviewed the triage vital signs and the nursing notes.  Pertinent labs & imaging results that were available during my care of the patient were reviewed by me and considered in my medical decision making (see chart for details).    MDM Rules/Calculators/A&P                          54 year old female presents to the ED today complaining of gradual onset, sharp, left lower quadrant radiating to the left flank pain for the past week.  Seen in urgent care earlier today and sent over here with concern for ovarian torsion.  On arrival to the ED patient is afebrile, nontachypneic, mildly tachycardic in the low 100s however this is resolved since patient is in the room.  On exam she has tenderness to the left lower quadrant as well as the left flank.  Appears her urinalysis at urgent care had a trace hemoglobin, no history of kidney stones in the past.  Patient denies any hematuria and states she is unsure if this is blood from her bleeding  hemorrhoids.  Is not sexually active currently, is not concerned about STIs and defers GU exam at this time.  I am more suspicious for kidney stone versus diverticulitis given where her pain is however given she was sent over here for ovarian torsion will obtain ultrasound today.  CBC, CMP, lipase, UA ordered as well as Toradol for pain  CMP without electrolyte abnormalities  Lipase 27 It appears CBC has clotted; will need it re-drawn. Nursing staff made aware. She is in ultrasound currently and once she comes back will have IV placed with redraw.   At shift change case signed out to [place] Robinson, PA-C, who will dispo patient accordingly. If no signs of ovarian torsion on ultrasound which I do not suspect today will need CT scan to rule out stone vs diverticulitis.   This note was prepared using Dragon voice recognition software and may include unintentional dictation errors due to the inherent limitations of voice recognition software.  Final Clinical Impression(s) / ED Diagnoses Final diagnoses:  Pelvic pain    Rx / DC Orders ED Discharge Orders    None       Tanda Rockers, PA-C 04/26/20 1519    Gwyneth Sprout, MD 04/28/20 1017

## 2020-04-26 NOTE — ED Triage Notes (Addendum)
One week ago today patient lifted a box and now has left lower abd pain radiating to her flank. 7/10 pain and is constant. Has increased pain with bending over or reaching. Took tylenol yesterday with no relief.

## 2020-04-26 NOTE — ED Triage Notes (Signed)
Patient c/o constant LLQ pain x 1 week. Patient states pain has been getting progressively worse. Patient states she has had "a little diarrhea."

## 2020-04-26 NOTE — ED Notes (Signed)
Waiting for pt to get back from [place] to start IV.

## 2020-04-26 NOTE — ED Notes (Signed)
Patient is being discharged from the Urgent Care and sent to the Emergency Department via personal vehicle . Per provider Dahlia Byes, patient is in need of higher level of care due to possible ovarian torsion. Patient is aware and verbalizes understanding of plan of care.   Vitals:   04/26/20 1147 04/26/20 1240  BP: 109/83   Pulse: (!) 112 (!) 112  Resp: 16   Temp: 97.8 F (36.6 C)   SpO2: 100%

## 2020-04-26 NOTE — Discharge Instructions (Addendum)
Please follow closely with your primary care provider regarding your visit today. Schedule an appointment with your gynecologist to discuss CT findings within your pelvis. Return to the ED if you develop severely worsening pain, fever, uncontrollable vomiting, or other concerning symptoms.

## 2020-04-26 NOTE — ED Provider Notes (Signed)
Care assumed at shift change from [place], [place], pending pelvic US and remaining laboratory work. See their note for full HPI and workup. Briefly, pt presenting with left lower abd pain radiating to back for 1 week. Seen at [facility] today, sent to ED to rule out ovarian torsion.   Pt reports pain began about 1 week ago, is worse with movement. No new changes in bowel habits or urinary sx.   Physical Exam  BP 124/79   Pulse (!) 101   Temp 98.7 F (37.1 C) (Oral)   Resp 18   Ht 5\' 1"  (1.549 m)   Wt 45.8 kg   LMP 01/05/2011   SpO2 100%   BMI 19.08 kg/m   Physical Exam Vitals and nursing note reviewed.  Constitutional:      General: She is not in acute distress.    Appearance: She is well-developed. She is not ill-appearing.  HENT:     Head: Normocephalic and atraumatic.  Eyes:     Conjunctiva/sclera: Conjunctivae normal.  Cardiovascular:     Rate and Rhythm: Normal rate.  Pulmonary:     Effort: Pulmonary effort is normal.  Abdominal:     General: Bowel sounds are normal.     Palpations: Abdomen is soft.     Tenderness: There is abdominal tenderness in the left lower quadrant. There is no guarding or rebound.  Skin:    General: Skin is warm.  Neurological:     Mental Status: She is alert.  Psychiatric:        Behavior: Behavior normal.     ED Course/Procedures     Procedures Results for orders placed or performed during the hospital encounter of 04/26/20  Lipase, blood  Result Value Ref Range   Lipase 27 11 - 51 U/L  Comprehensive metabolic panel  Result Value Ref Range   Sodium 138 135 - 145 mmol/L   Potassium 5.0 3.5 - 5.1 mmol/L   Chloride 100 98 - 111 mmol/L   CO2 28 22 - 32 mmol/L   Glucose, Bld 93 70 - 99 mg/dL   BUN 15 6 - 20 mg/dL   Creatinine, Ser 04/28/20 0.44 - 1.00 mg/dL   Calcium 9.5 8.9 - 7.56 mg/dL   Total Protein 8.0 6.5 - 8.1 g/dL   Albumin 4.7 3.5 - 5.0 g/dL   AST 29 15 - 41 U/L   ALT 16 0 - 44 U/L   Alkaline Phosphatase 49 38 - 126 U/L   Total  Bilirubin 1.1 0.3 - 1.2 mg/dL   GFR, Estimated 43.3 >29 mL/min   Anion gap 10 5 - 15  Urinalysis, Routine w reflex microscopic  Result Value Ref Range   Color, Urine STRAW (A) YELLOW   APPearance CLEAR CLEAR   Specific Gravity, Urine 1.008 1.005 - 1.030   pH 6.0 5.0 - 8.0   Glucose, UA NEGATIVE NEGATIVE mg/dL   Hgb urine dipstick SMALL (A) NEGATIVE   Bilirubin Urine NEGATIVE NEGATIVE   Ketones, ur NEGATIVE NEGATIVE mg/dL   Protein, ur NEGATIVE NEGATIVE mg/dL   Nitrite NEGATIVE NEGATIVE   Leukocytes,Ua NEGATIVE NEGATIVE   RBC / HPF 0-5 0 - 5 RBC/hpf   WBC, UA 0-5 0 - 5 WBC/hpf   Bacteria, UA RARE (A) NONE SEEN   Squamous Epithelial / LPF 0-5 0 - 5  CBC  Result Value Ref Range   WBC 7.0 4.0 - 10.5 K/uL   RBC 5.08 3.87 - 5.11 MIL/uL   Hemoglobin 14.8 12.0 - 15.0 g/dL  HCT 46.8 (H) 36 - 46 %   MCV 92.1 80.0 - 100.0 fL   MCH 29.1 26.0 - 34.0 pg   MCHC 31.6 30.0 - 36.0 g/dL   RDW 35.3 29.9 - 24.2 %   Platelets 221 150 - 400 K/uL   nRBC 0.0 0.0 - 0.2 %   CT Abdomen Pelvis W Contrast  Result Date: 04/26/2020 CLINICAL DATA:  Left side pain EXAM: CT ABDOMEN AND PELVIS WITH CONTRAST TECHNIQUE: Multidetector CT imaging of the abdomen and pelvis was performed using the standard protocol following bolus administration of intravenous contrast. CONTRAST:  OMNIPAQUE IOHEXOL 300 MG/ML  SOLN COMPARISON:  None. FINDINGS: Lower chest: Lung bases are clear. No effusions. Heart is normal size. Hepatobiliary: No focal liver abnormality is seen. Status post cholecystectomy. No biliary dilatation. Pancreas: No focal abnormality or ductal dilatation. Spleen: No focal abnormality.  Normal size. Adrenals/Urinary Tract: No adrenal abnormality. No focal renal abnormality. No stones or hydronephrosis. Urinary bladder is unremarkable. Stomach/Bowel: Stomach, large and small bowel grossly unremarkable. Large stool burden in the cecum. Appendix not definitively seen. Vascular/Lymphatic: No evidence of  aneurysm or adenopathy. Prominent left ovarian vein may reflect reflux. Reproductive: Uterus and ovaries unremarkable. Prominent vessels in the left adnexal region. Other: No free fluid or free air. Musculoskeletal: No acute bony abnormality. IMPRESSION: No acute findings in the abdomen or pelvis. Moderate to large stool burden in the cecum. Prominent vessels in the left adnexal region with prominent left ovarian vein which may be related to reflux. Electronically Signed   By: Charlett Nose M.D.   On: 04/26/2020 17:53   US PELVIC COMPLETE W TRANSVAGINAL AND TORSION R/O  Result Date: 04/26/2020 CLINICAL DATA:  LEFT pelvic pain. EXAM: TRANSABDOMINAL AND TRANSVAGINAL ULTRASOUND OF PELVIS DOPPLER ULTRASOUND OF OVARIES TECHNIQUE: Both transabdominal and transvaginal ultrasound examinations of the pelvis were performed. Transabdominal technique was performed for global imaging of the pelvis including uterus, ovaries, adnexal regions, and pelvic cul-de-sac. It was necessary to proceed with endovaginal exam following the transabdominal exam to visualize the uterus, endometrium, ovaries, adnexal regions. Color and duplex Doppler ultrasound was utilized to evaluate blood flow to the ovaries. COMPARISON:  CT of the abdomen and pelvis on 08/22/2017 FINDINGS: Uterus Measurements: 6.2 x 3.3 x 4.9 centimeters = volume: 51.9 mL. Uterus is anteflexed. No fibroids or other mass. Endometrium Thickness: 1.1 millimeters.  No focal abnormality visualized. Right ovary Measurements: 2.1 x 1.2 x 1.7 centimeter = volume: 2.2 mL. Normal appearance/no adnexal mass. Left ovary Measurements: 1.7 x 0.8 x 1.1 centimeters = volume: 0.8 mL. Normal appearance/no adnexal mass. Pulsed Doppler evaluation of both ovaries demonstrates normal low-resistance arterial and venous waveforms. Other findings Trace free pelvic fluid. IMPRESSION: Normal pelvic ultrasound.  No adnexal mass or evidence for torsion. Electronically Signed   By: Norva Pavlov  M.D.   On: 04/26/2020 15:48    MDM  Pelvic ultrasound is negative.  Given patient's abdominal exam, history of diverticulosis, CT AP ordered for further evaluation.   Labs are reassuring, no leukocytosis or electrolyte abnormality.  Lipase within normal limits.  UA is negative for signs of infection.  CT is negative for acute findings.  Discussed incidental findings of moderate to large stool burden in the cecum as well as prominent vessels in the left adnexa which could be reflective of pelvic congestion and may attribute to patient's pain.  Recommend PCP follow-up and GYN follow-up.  Pain medication discussed, will provide small amount.  She is instructed of strict return  precautions, including fever, worsening pain, uncontrollable vomiting, or other concerning symptoms.  Patient discharged in no acute distress.       Alaney Witter, [place] N, PA-C 04/26/20 1937    Gerhard Munch, MD 04/26/20 [id number]

## 2020-04-26 NOTE — Discharge Instructions (Addendum)
I am concerned for ovarian torsion I would like for you to go to the ER for an ultrasound.  Follow up as needed for continued or worsening symptoms

## 2020-04-27 DIAGNOSIS — F4323 Adjustment disorder with mixed anxiety and depressed mood: Secondary | ICD-10-CM | POA: Diagnosis not present

## 2020-05-03 ENCOUNTER — Ambulatory Visit: Payer: Medicare Other | Admitting: Neurology

## 2020-05-09 DIAGNOSIS — F4323 Adjustment disorder with mixed anxiety and depressed mood: Secondary | ICD-10-CM | POA: Diagnosis not present

## 2020-05-10 ENCOUNTER — Ambulatory Visit (INDEPENDENT_AMBULATORY_CARE_PROVIDER_SITE_OTHER): Payer: Medicare Other | Admitting: Neurology

## 2020-05-10 DIAGNOSIS — G43711 Chronic migraine without aura, intractable, with status migrainosus: Secondary | ICD-10-CM

## 2020-05-10 NOTE — Progress Notes (Signed)
Botox- 200 UnitsX1 Lot: [id number] cExpiration: 09/2022 NDC: 1025-8527-78  Bacteriostatic 0.9% Sodium Chloride- 69mL total [id number] Expiration: 07/12/2021 NDC: 4315-4008-67  Dx: G43.711 S/P

## 2020-05-10 NOTE — Progress Notes (Signed)
05/10/2020: This is our third botox. She did great, > 80% improvement in headaches and migraines,  She has very tight masseters +15 each side. Also she does not tolerate increased botox in the  trap/cervical muscles but added +LS, the right side is worse. No side effects, she did well, if she has any forehead drooping we can move the botox higher but she did well, advised not to lay down for 4 hours and no rubbing face to avoid botox spread and ptosis. She is unfortunately getting divorced, her husband left her on Easter. She is in therapy with him and things are working out. The remdesevir helped her migraines, ask about that again.   Consent Form Botulism Toxin Injection For Chronic Migraine    Reviewed orally with patient, additionally signature is on file:  Botulism toxin has been approved by the Federal drug administration for treatment of chronic migraine. Botulism toxin does not cure chronic migraine and it may not be effective in some patients.  The administration of botulism toxin is accomplished by injecting a small amount of toxin into the muscles of the neck and head. Dosage must be titrated for each individual. Any benefits resulting from botulism toxin tend to wear off after 3 months with a repeat injection required if benefit is to be maintained. Injections are usually done every 3-4 months with maximum effect peak achieved by about 2 or 3 weeks. Botulism toxin is expensive and you should be sure of what costs you will incur resulting from the injection.  The side effects of botulism toxin use for chronic migraine may include:   -Transient, and usually mild, facial weakness with facial injections  -Transient, and usually mild, head or neck weakness with head/neck injections  -Reduction or loss of forehead facial animation due to forehead muscle weakness  -Eyelid drooping  -Dry eye  -Pain at the site of injection or bruising at the site of injection  -Double vision  -Potential  unknown long term risks  Contraindications: You should not have Botox if you are pregnant, nursing, allergic to albumin, have an infection, skin condition, or muscle weakness at the site of the injection, or have myasthenia gravis, Lambert-Eaton syndrome, or ALS.  It is also possible that as with any injection, there may be an allergic reaction or no effect from the medication. Reduced effectiveness after repeated injections is sometimes seen and rarely infection at the injection site may occur. All care will be taken to prevent these side effects. If therapy is given over a long time, atrophy and wasting in the muscle injected may occur. Occasionally the patient's become refractory to treatment because they develop antibodies to the toxin. In this event, therapy needs to be modified.  I have read the above information and consent to the administration of botulism toxin.    BOTOX PROCEDURE NOTE FOR MIGRAINE HEADACHE    Contraindications and precautions discussed with patient(above). Aseptic procedure was observed and patient tolerated procedure. Procedure performed by Dr. Artemio Aly  The condition has existed for more than 6 months, and pt does not have a diagnosis of ALS, Myasthenia Gravis or Lambert-Eaton Syndrome.  Risks and benefits of injections discussed and pt agrees to proceed with the procedure.  Written consent obtained  These injections are medically necessary. Pt  receives good benefits from these injections. These injections do not cause sedations or hallucinations which the oral therapies may cause.  Description of procedure:  The patient was placed in a sitting position. The standard protocol was  used for Botox as follows, with 5 units of Botox injected at each site:   -Procerus muscle, midline injection  -Corrugator muscle, bilateral injection  -Frontalis muscle, bilateral injection, with 2 sites each side, medial injection was performed in the upper one third of the  frontalis muscle, in the region vertical from the medial inferior edge of the superior orbital rim. The lateral injection was again in the upper one third of the forehead vertically above the lateral limbus of the cornea, 1.5 cm lateral to the medial injection site.  -Temporalis muscle injection, 4 sites, bilaterally. The first injection was 3 cm above the tragus of the ear, second injection site was 1.5 cm to 3 cm up from the first injection site in line with the tragus of the ear. The third injection site was 1.5-3 cm forward between the first 2 injection sites. The fourth injection site was 1.5 cm posterior to the second injection site.   -Occipitalis muscle injection, 3 sites, bilaterally. The first injection was done one half way between the occipital protuberance and the tip of the mastoid process behind the ear. The second injection site was done lateral and superior to the first, 1 fingerbreadth from the first injection. The third injection site was 1 fingerbreadth superiorly and medially from the first injection site.  -Cervical paraspinal muscle injection, 2 sites, bilateral knee first injection site was 1 cm from the midline of the cervical spine, 3 cm inferior to the lower border of the occipital protuberance. The second injection site was 1.5 cm superiorly and laterally to the first injection site.  -Trapezius muscle injection was performed at 3 sites, bilaterally. The first injection site was in the upper trapezius muscle halfway between the inflection point of the neck, and the acromion. The second injection site was one half way between the acromion and the first injection site. The third injection was done between the first injection site and the inflection point of the neck.   Will return for repeat injection in 3 months.   200 units of Botox was used, any Botox not injected was wasted. The patient tolerated the procedure well, there were no complications of the above  procedure.

## 2020-05-13 DIAGNOSIS — Z0000 Encounter for general adult medical examination without abnormal findings: Secondary | ICD-10-CM | POA: Diagnosis not present

## 2020-05-13 DIAGNOSIS — E7800 Pure hypercholesterolemia, unspecified: Secondary | ICD-10-CM | POA: Diagnosis not present

## 2020-05-16 DIAGNOSIS — F4323 Adjustment disorder with mixed anxiety and depressed mood: Secondary | ICD-10-CM | POA: Diagnosis not present

## 2020-05-20 DIAGNOSIS — Z1212 Encounter for screening for malignant neoplasm of rectum: Secondary | ICD-10-CM | POA: Diagnosis not present

## 2020-05-20 DIAGNOSIS — R82998 Other abnormal findings in urine: Secondary | ICD-10-CM | POA: Diagnosis not present

## 2020-05-20 DIAGNOSIS — Z0000 Encounter for general adult medical examination without abnormal findings: Secondary | ICD-10-CM | POA: Diagnosis not present

## 2020-05-20 DIAGNOSIS — E7800 Pure hypercholesterolemia, unspecified: Secondary | ICD-10-CM | POA: Diagnosis not present

## 2020-05-24 DIAGNOSIS — F4323 Adjustment disorder with mixed anxiety and depressed mood: Secondary | ICD-10-CM | POA: Diagnosis not present

## 2020-06-07 DIAGNOSIS — F4323 Adjustment disorder with mixed anxiety and depressed mood: Secondary | ICD-10-CM | POA: Diagnosis not present

## 2020-06-17 DIAGNOSIS — F4321 Adjustment disorder with depressed mood: Secondary | ICD-10-CM | POA: Diagnosis not present

## 2020-06-20 ENCOUNTER — Telehealth: Payer: Self-pay | Admitting: Neurology

## 2020-06-20 DIAGNOSIS — F411 Generalized anxiety disorder: Secondary | ICD-10-CM | POA: Diagnosis not present

## 2020-06-20 DIAGNOSIS — F5112 Insufficient sleep syndrome: Secondary | ICD-10-CM | POA: Diagnosis not present

## 2020-06-20 DIAGNOSIS — F902 Attention-deficit hyperactivity disorder, combined type: Secondary | ICD-10-CM | POA: Diagnosis not present

## 2020-06-20 NOTE — Telephone Encounter (Signed)
Patient has her next Botox appointment on 3/1. I received a VM over the weekend from [facility], advising that the NPI we have on patient's PA with [employer] for the specialty pharmacy is incorrect. I called [employer] and spoke with Chanel, who states that I must submit a new PA request to change this, as it can not be done over the phone. I will fill out new PA form and fax it to [facility] with the correct NPI number.

## 2020-06-21 DIAGNOSIS — F4323 Adjustment disorder with mixed anxiety and depressed mood: Secondary | ICD-10-CM | POA: Diagnosis not present

## 2020-06-23 NOTE — Telephone Encounter (Signed)
Received approval with updated NPI from [employer]. PA #[id number] (03/15/20- 03/14/21). I called [facility] and spoke with Vernona Rieger to give PA information.

## 2020-06-28 DIAGNOSIS — G43711 Chronic migraine without aura, intractable, with status migrainosus: Secondary | ICD-10-CM | POA: Diagnosis not present

## 2020-06-29 NOTE — Telephone Encounter (Signed)
Received (2) 100U vials of Botox today from [facility]. Patient's appointment is on 3/1.

## 2020-07-05 DIAGNOSIS — F4321 Adjustment disorder with depressed mood: Secondary | ICD-10-CM | POA: Diagnosis not present

## 2020-07-05 DIAGNOSIS — F4323 Adjustment disorder with mixed anxiety and depressed mood: Secondary | ICD-10-CM | POA: Diagnosis not present

## 2020-07-07 ENCOUNTER — Ambulatory Visit (INDEPENDENT_AMBULATORY_CARE_PROVIDER_SITE_OTHER): Payer: Medicare Other

## 2020-07-07 ENCOUNTER — Encounter: Payer: Self-pay | Admitting: Podiatry

## 2020-07-07 ENCOUNTER — Ambulatory Visit (INDEPENDENT_AMBULATORY_CARE_PROVIDER_SITE_OTHER): Payer: Medicare Other | Admitting: Podiatry

## 2020-07-07 ENCOUNTER — Other Ambulatory Visit: Payer: Self-pay

## 2020-07-07 DIAGNOSIS — M79671 Pain in right foot: Secondary | ICD-10-CM | POA: Diagnosis not present

## 2020-07-07 DIAGNOSIS — M779 Enthesopathy, unspecified: Secondary | ICD-10-CM | POA: Diagnosis not present

## 2020-07-07 DIAGNOSIS — M79672 Pain in left foot: Secondary | ICD-10-CM

## 2020-07-07 DIAGNOSIS — M25571 Pain in right ankle and joints of right foot: Secondary | ICD-10-CM | POA: Diagnosis not present

## 2020-07-07 MED ORDER — TRIAMCINOLONE ACETONIDE 10 MG/ML IJ SUSP
10.0000 mg | Freq: Once | INTRAMUSCULAR | Status: AC
  Administered 2020-07-07: 10 mg

## 2020-07-08 NOTE — Progress Notes (Signed)
Subjective:   Patient ID: [name] [name], female   DOB: 55 y.o.   MRN: [id number]   HPI Patient presents stating that she has had an intensification of her right foot pain and ankle pain and states that she does have history of chronic pain syndrome and that it is affected her feet her hands and that it makes it difficult for her to be active because of the pain she is experiencing her foot.  Patient does not smoke and would like to be more active   Review of Systems  All other systems reviewed and are negative.       Objective:  Physical Exam Vitals and nursing note reviewed.  Constitutional:      Appearance: She is well-developed and well-nourished.  Cardiovascular:     Pulses: Intact distal pulses.  Pulmonary:     Effort: Pulmonary effort is normal.  Musculoskeletal:        General: Normal range of motion.  Skin:    General: Skin is warm.  Neurological:     Mental Status: She is alert.     Neurovascular status was found to be intact with patient not exhibiting mottled skin formation or coolness to the foot or indications of disuse atrophy.  Patient has discomfort which peers do extend from the sinus tarsi distal and has had numerous orthotics and different types of devices tried to help with the discomfort she experiences.  She does get problems with her hands also and this appears to be a more chronic problem related to systemic disease     Assessment:  Possibility for acute sinus tarsitis right along with chronic pain syndrome which appears to be through multiple areas and she cannot tolerate it with any medications at the current time      Plan:  H&P and reviewed condition and x-rays at great length.  This is a difficult problem and there is most likely no long-term complete answer but I am hoping that his years ago by this will burn out as it is been present for around 8 years.  It has gradually improved but still gives her a lot of problems and today and get a try to  help with acute inflammation and I did sterile prep and injected the sinus tarsi 3 mg dexamethasone Kenalog 5 mg Xylocaine advised on reduced activity soaks and patient will be seen back we also discussed possible bracing and whether she could tolerate this to try to help with this chronic pain she experiences  X-rays indicate no signs of ankle instability diastases injury or indications of complex pain syndrome with her views.

## 2020-07-12 DIAGNOSIS — F4321 Adjustment disorder with depressed mood: Secondary | ICD-10-CM | POA: Diagnosis not present

## 2020-07-19 ENCOUNTER — Other Ambulatory Visit: Payer: Self-pay

## 2020-07-19 ENCOUNTER — Other Ambulatory Visit: Payer: Medicare Other | Admitting: Orthotics

## 2020-07-26 DIAGNOSIS — F4323 Adjustment disorder with mixed anxiety and depressed mood: Secondary | ICD-10-CM | POA: Diagnosis not present

## 2020-07-27 DIAGNOSIS — F4321 Adjustment disorder with depressed mood: Secondary | ICD-10-CM | POA: Diagnosis not present

## 2020-08-03 DIAGNOSIS — F4321 Adjustment disorder with depressed mood: Secondary | ICD-10-CM | POA: Diagnosis not present

## 2020-08-08 DIAGNOSIS — K648 Other hemorrhoids: Secondary | ICD-10-CM | POA: Diagnosis not present

## 2020-08-08 DIAGNOSIS — K644 Residual hemorrhoidal skin tags: Secondary | ICD-10-CM | POA: Diagnosis not present

## 2020-08-09 ENCOUNTER — Ambulatory Visit (INDEPENDENT_AMBULATORY_CARE_PROVIDER_SITE_OTHER): Payer: BC Managed Care – PPO | Admitting: Neurology

## 2020-08-09 DIAGNOSIS — G43711 Chronic migraine without aura, intractable, with status migrainosus: Secondary | ICD-10-CM | POA: Diagnosis not present

## 2020-08-09 MED ORDER — HYDROCODONE-ACETAMINOPHEN 5-325 MG PO TABS
1.0000 | ORAL_TABLET | Freq: Four times a day (QID) | ORAL | 0 refills | Status: DC | PRN
Start: 2020-08-09 — End: 2021-08-22

## 2020-08-09 NOTE — Progress Notes (Signed)
08/09/2020: Still doing great. +5 left masseter, +15 right masseter, +a. Only put in the right levator(ask her where to put it, right side hurts). Can give one norco a year.  05/10/2020: This is our third botox. She did great, > 80% improvement in headaches and migraines,  She has very tight masseters +15 each side. Also she does not tolerate increased botox in the  trap/cervical muscles but added +LS, the right side is worse. No side effects, she did well, if she has any forehead drooping we can move the botox higher but she did well, advised not to lay down for 4 hours and no rubbing face to avoid botox spread and ptosis. She is unfortunately getting divorced, her husband left her on Easter. She is in therapy with him and things are working out. The remdesevir helped her migraines, ask about that again.   Consent Form Botulism Toxin Injection For Chronic Migraine    Reviewed orally with patient, additionally signature is on file:  Botulism toxin has been approved by the Federal drug administration for treatment of chronic migraine. Botulism toxin does not cure chronic migraine and it may not be effective in some patients.  The administration of botulism toxin is accomplished by injecting a small amount of toxin into the muscles of the neck and head. Dosage must be titrated for each individual. Any benefits resulting from botulism toxin tend to wear off after 3 months with a repeat injection required if benefit is to be maintained. Injections are usually done every 3-4 months with maximum effect peak achieved by about 2 or 3 weeks. Botulism toxin is expensive and you should be sure of what costs you will incur resulting from the injection.  The side effects of botulism toxin use for chronic migraine may include:   -Transient, and usually mild, facial weakness with facial injections  -Transient, and usually mild, head or neck weakness with head/neck injections  -Reduction or loss of forehead facial  animation due to forehead muscle weakness  -Eyelid drooping  -Dry eye  -Pain at the site of injection or bruising at the site of injection  -Double vision  -Potential unknown long term risks  Contraindications: You should not have Botox if you are pregnant, nursing, allergic to albumin, have an infection, skin condition, or muscle weakness at the site of the injection, or have myasthenia gravis, Lambert-Eaton syndrome, or ALS.  It is also possible that as with any injection, there may be an allergic reaction or no effect from the medication. Reduced effectiveness after repeated injections is sometimes seen and rarely infection at the injection site may occur. All care will be taken to prevent these side effects. If therapy is given over a long time, atrophy and wasting in the muscle injected may occur. Occasionally the patient's become refractory to treatment because they develop antibodies to the toxin. In this event, therapy needs to be modified.  I have read the above information and consent to the administration of botulism toxin.    BOTOX PROCEDURE NOTE FOR MIGRAINE HEADACHE    Contraindications and precautions discussed with patient(above). Aseptic procedure was observed and patient tolerated procedure. Procedure performed by Dr. Artemio Aly  The condition has existed for more than 6 months, and pt does not have a diagnosis of ALS, Myasthenia Gravis or Lambert-Eaton Syndrome.  Risks and benefits of injections discussed and pt agrees to proceed with the procedure.  Written consent obtained  These injections are medically necessary. Pt  receives good benefits from these  injections. These injections do not cause sedations or hallucinations which the oral therapies may cause.  Description of procedure:  The patient was placed in a sitting position. The standard protocol was used for Botox as follows, with 5 units of Botox injected at each site:   -Procerus muscle, midline  injection  -Corrugator muscle, bilateral injection  -Frontalis muscle, bilateral injection, with 2 sites each side, medial injection was performed in the upper one third of the frontalis muscle, in the region vertical from the medial inferior edge of the superior orbital rim. The lateral injection was again in the upper one third of the forehead vertically above the lateral limbus of the cornea, 1.5 cm lateral to the medial injection site.  -Temporalis muscle injection, 4 sites, bilaterally. The first injection was 3 cm above the tragus of the ear, second injection site was 1.5 cm to 3 cm up from the first injection site in line with the tragus of the ear. The third injection site was 1.5-3 cm forward between the first 2 injection sites. The fourth injection site was 1.5 cm posterior to the second injection site.   -Occipitalis muscle injection, 3 sites, bilaterally. The first injection was done one half way between the occipital protuberance and the tip of the mastoid process behind the ear. The second injection site was done lateral and superior to the first, 1 fingerbreadth from the first injection. The third injection site was 1 fingerbreadth superiorly and medially from the first injection site.  -Cervical paraspinal muscle injection, 2 sites, bilateral knee first injection site was 1 cm from the midline of the cervical spine, 3 cm inferior to the lower border of the occipital protuberance. The second injection site was 1.5 cm superiorly and laterally to the first injection site.  -Trapezius muscle injection was performed at 3 sites, bilaterally. The first injection site was in the upper trapezius muscle halfway between the inflection point of the neck, and the acromion. The second injection site was one half way between the acromion and the first injection site. The third injection was done between the first injection site and the inflection point of the neck.   Will return for repeat injection  in 3 months.   200 units of Botox was used, any Botox not injected was wasted. The patient tolerated the procedure well, there were no complications of the above procedure.

## 2020-08-09 NOTE — Progress Notes (Signed)
Botox- 100 units x 2 vials Lot: [id number] Expiration: 07/2022 NDC: 1117-3567-01  Bacteriostatic 0.9% Sodium Chloride- 74mL total Lot: [id number] Expiration: 07/12/2021 NDC: 3143-8887-57  Dx: V72.820 S/P

## 2020-08-09 NOTE — Addendum Note (Signed)
Addended by: Bertram Savin on: 08/09/2020 02:40 PM   Modules accepted: Orders

## 2020-08-11 DIAGNOSIS — F4323 Adjustment disorder with mixed anxiety and depressed mood: Secondary | ICD-10-CM | POA: Diagnosis not present

## 2020-08-16 ENCOUNTER — Ambulatory Visit (INDEPENDENT_AMBULATORY_CARE_PROVIDER_SITE_OTHER): Payer: Medicare Other | Admitting: Podiatry

## 2020-08-16 ENCOUNTER — Other Ambulatory Visit: Payer: Self-pay

## 2020-08-16 DIAGNOSIS — M25571 Pain in right ankle and joints of right foot: Secondary | ICD-10-CM

## 2020-08-16 DIAGNOSIS — M779 Enthesopathy, unspecified: Secondary | ICD-10-CM

## 2020-08-16 NOTE — Progress Notes (Signed)
Patient presents today for orthotic pick up. Patient voices no new complaints.  Orthotics were fitted to patient's feet. No discomfort and no rubbing. Patient was satisfied with the orthotics but was concerned that the inserts were not full length.  I stated to the patient to try the inserts and give it about 2 to 4 weeks and if not satisfied to come back to the office and we would re-adjust.  Orthotics were dispensed to the patient with instructions for break in wear and to call the office with any concerns or questions.

## 2020-08-16 NOTE — Patient Instructions (Signed)

## 2020-08-18 DIAGNOSIS — F4321 Adjustment disorder with depressed mood: Secondary | ICD-10-CM | POA: Diagnosis not present

## 2020-08-25 DIAGNOSIS — F4323 Adjustment disorder with mixed anxiety and depressed mood: Secondary | ICD-10-CM | POA: Diagnosis not present

## 2020-09-05 DIAGNOSIS — F4321 Adjustment disorder with depressed mood: Secondary | ICD-10-CM | POA: Diagnosis not present

## 2020-09-14 DIAGNOSIS — F4323 Adjustment disorder with mixed anxiety and depressed mood: Secondary | ICD-10-CM | POA: Diagnosis not present

## 2020-09-23 DIAGNOSIS — E162 Hypoglycemia, unspecified: Secondary | ICD-10-CM | POA: Diagnosis not present

## 2020-09-29 DIAGNOSIS — F4321 Adjustment disorder with depressed mood: Secondary | ICD-10-CM | POA: Diagnosis not present

## 2020-10-03 ENCOUNTER — Telehealth: Payer: Self-pay | Admitting: Neurology

## 2020-10-03 NOTE — Telephone Encounter (Signed)
Patient's next Botox appointment is 6/1. I received a fax from [facility] stating they have been trying to reach the patient to get consent for shipment. I called [facility] and spoke with Jacki Cones who states that the prescription has been restarted, but since it is April she advised to call back around the first of May to schedule delivery. She also let me know that they have a new policy in place which states that if a patient's co-pay is $250.00 or lower, they no longer need the patient's consent for shipment.

## 2020-10-19 NOTE — Telephone Encounter (Signed)
I called [facility] and spoke with Casimiro Needle to schedule Botox delivery. Casimiro Needle states patient will need to pay $50.00 co-pay before Botox can be shipped.

## 2020-10-21 ENCOUNTER — Other Ambulatory Visit: Payer: Self-pay

## 2020-10-21 ENCOUNTER — Ambulatory Visit (INDEPENDENT_AMBULATORY_CARE_PROVIDER_SITE_OTHER): Payer: Medicare Other

## 2020-10-21 DIAGNOSIS — M779 Enthesopathy, unspecified: Secondary | ICD-10-CM

## 2020-10-24 NOTE — Telephone Encounter (Signed)
I called [facility] and spoke with Trey Paula to schedule Botox delivery. Botox TBD 5/18.

## 2020-10-25 DIAGNOSIS — G43711 Chronic migraine without aura, intractable, with status migrainosus: Secondary | ICD-10-CM | POA: Diagnosis not present

## 2020-10-26 NOTE — Telephone Encounter (Signed)
Received (2) 100 unit vials of Botox today from [facility].

## 2020-11-09 ENCOUNTER — Ambulatory Visit (INDEPENDENT_AMBULATORY_CARE_PROVIDER_SITE_OTHER): Payer: Medicare Other | Admitting: Neurology

## 2020-11-09 DIAGNOSIS — G43711 Chronic migraine without aura, intractable, with status migrainosus: Secondary | ICD-10-CM | POA: Diagnosis not present

## 2020-11-09 NOTE — Progress Notes (Signed)
11/09/2020: +10 left masseter, +15 right masseter, +a. Put in both levators and traps, ask her to show where  08/09/2020: Still doing great. +5 left masseter, +15 right masseter, +a. Only put in the right levator(ask her where to put it, right side hurts). Can give one norco a year.  05/10/2020: This is our third botox. She did great, > 80% improvement in headaches and migraines,  She has very tight masseters +15 each side. Also she does not tolerate increased botox in the  trap/cervical muscles but added +LS, the right side is worse. No side effects, she did well, if she has any forehead drooping we can move the botox higher but she did well, advised not to lay down for 4 hours and no rubbing face to avoid botox spread and ptosis. She is unfortunately getting divorced, her husband left her on Easter. She is in therapy with him and things are working out. The remdesevir helped her migraines, ask about that again.   Consent Form Botulism Toxin Injection For Chronic Migraine    Reviewed orally with patient, additionally signature is on file:  Botulism toxin has been approved by the Federal drug administration for treatment of chronic migraine. Botulism toxin does not cure chronic migraine and it may not be effective in some patients.  The administration of botulism toxin is accomplished by injecting a small amount of toxin into the muscles of the neck and head. Dosage must be titrated for each individual. Any benefits resulting from botulism toxin tend to wear off after 3 months with a repeat injection required if benefit is to be maintained. Injections are usually done every 3-4 months with maximum effect peak achieved by about 2 or 3 weeks. Botulism toxin is expensive and you should be sure of what costs you will incur resulting from the injection.  The side effects of botulism toxin use for chronic migraine may include:   -Transient, and usually mild, facial weakness with facial  injections  -Transient, and usually mild, head or neck weakness with head/neck injections  -Reduction or loss of forehead facial animation due to forehead muscle weakness  -Eyelid drooping  -Dry eye  -Pain at the site of injection or bruising at the site of injection  -Double vision  -Potential unknown long term risks  Contraindications: You should not have Botox if you are pregnant, nursing, allergic to albumin, have an infection, skin condition, or muscle weakness at the site of the injection, or have myasthenia gravis, Lambert-Eaton syndrome, or ALS.  It is also possible that as with any injection, there may be an allergic reaction or no effect from the medication. Reduced effectiveness after repeated injections is sometimes seen and rarely infection at the injection site may occur. All care will be taken to prevent these side effects. If therapy is given over a long time, atrophy and wasting in the muscle injected may occur. Occasionally the patient's become refractory to treatment because they develop antibodies to the toxin. In this event, therapy needs to be modified.  I have read the above information and consent to the administration of botulism toxin.    BOTOX PROCEDURE NOTE FOR MIGRAINE HEADACHE    Contraindications and precautions discussed with patient(above). Aseptic procedure was observed and patient tolerated procedure. Procedure performed by Dr. Artemio Aly  The condition has existed for more than 6 months, and pt does not have a diagnosis of ALS, Myasthenia Gravis or Lambert-Eaton Syndrome.  Risks and benefits of injections discussed and pt agrees to proceed  with the procedure.  Written consent obtained  These injections are medically necessary. Pt  receives good benefits from these injections. These injections do not cause sedations or hallucinations which the oral therapies may cause.  Description of procedure:  The patient was placed in a sitting position. The  standard protocol was used for Botox as follows, with 5 units of Botox injected at each site:   -Procerus muscle, midline injection  -Corrugator muscle, bilateral injection  -Frontalis muscle, bilateral injection, with 2 sites each side, medial injection was performed in the upper one third of the frontalis muscle, in the region vertical from the medial inferior edge of the superior orbital rim. The lateral injection was again in the upper one third of the forehead vertically above the lateral limbus of the cornea, 1.5 cm lateral to the medial injection site.  -Temporalis muscle injection, 4 sites, bilaterally. The first injection was 3 cm above the tragus of the ear, second injection site was 1.5 cm to 3 cm up from the first injection site in line with the tragus of the ear. The third injection site was 1.5-3 cm forward between the first 2 injection sites. The fourth injection site was 1.5 cm posterior to the second injection site.   -Occipitalis muscle injection, 3 sites, bilaterally. The first injection was done one half way between the occipital protuberance and the tip of the mastoid process behind the ear. The second injection site was done lateral and superior to the first, 1 fingerbreadth from the first injection. The third injection site was 1 fingerbreadth superiorly and medially from the first injection site.  -Cervical paraspinal muscle injection, 2 sites, bilateral knee first injection site was 1 cm from the midline of the cervical spine, 3 cm inferior to the lower border of the occipital protuberance. The second injection site was 1.5 cm superiorly and laterally to the first injection site.  -Trapezius muscle injection was performed at 3 sites, bilaterally. The first injection site was in the upper trapezius muscle halfway between the inflection point of the neck, and the acromion. The second injection site was one half way between the acromion and the first injection site. The third  injection was done between the first injection site and the inflection point of the neck.   Will return for repeat injection in 3 months.   200 units of Botox was used, any Botox not injected was wasted. The patient tolerated the procedure well, there were no complications of the above procedure.

## 2020-11-09 NOTE — Progress Notes (Signed)
Botox- 100 units x 2 vials Lot: [id number] Expiration: 031594 NDC: 5859-2924-46  Bacteriostatic 0.9% Sodium Chloride- 34mL total Lot: [id number] Expiration: 11/09/2021 NDC: 7711-6579-03  Dx: Y33.383 S/P

## 2020-11-18 NOTE — Progress Notes (Signed)
Patient seen by EJ today in office. Orthotics will be refurbished with full cover.

## 2020-12-08 DIAGNOSIS — K644 Residual hemorrhoidal skin tags: Secondary | ICD-10-CM | POA: Diagnosis not present

## 2020-12-08 DIAGNOSIS — K625 Hemorrhage of anus and rectum: Secondary | ICD-10-CM | POA: Diagnosis not present

## 2020-12-08 DIAGNOSIS — K589 Irritable bowel syndrome without diarrhea: Secondary | ICD-10-CM | POA: Diagnosis not present

## 2020-12-08 DIAGNOSIS — K641 Second degree hemorrhoids: Secondary | ICD-10-CM | POA: Diagnosis not present

## 2020-12-14 DIAGNOSIS — F902 Attention-deficit hyperactivity disorder, combined type: Secondary | ICD-10-CM | POA: Diagnosis not present

## 2020-12-14 DIAGNOSIS — F411 Generalized anxiety disorder: Secondary | ICD-10-CM | POA: Diagnosis not present

## 2021-01-17 NOTE — Telephone Encounter (Signed)
Spoke with patient today because she needed to r/s her Botox appointment. During conversation, she requested a call specifically from Dr. Lucia Gaskins to discuss concerns with other "neurologic issues"

## 2021-02-08 DIAGNOSIS — Z01419 Encounter for gynecological examination (general) (routine) without abnormal findings: Secondary | ICD-10-CM | POA: Diagnosis not present

## 2021-02-08 DIAGNOSIS — Z124 Encounter for screening for malignant neoplasm of cervix: Secondary | ICD-10-CM | POA: Diagnosis not present

## 2021-02-08 DIAGNOSIS — Z1231 Encounter for screening mammogram for malignant neoplasm of breast: Secondary | ICD-10-CM | POA: Diagnosis not present

## 2021-02-14 ENCOUNTER — Ambulatory Visit: Payer: Self-pay | Admitting: Neurology

## 2021-02-20 DIAGNOSIS — G43711 Chronic migraine without aura, intractable, with status migrainosus: Secondary | ICD-10-CM | POA: Diagnosis not present

## 2021-02-21 ENCOUNTER — Ambulatory Visit: Payer: Medicare Other | Admitting: Neurology

## 2021-02-21 ENCOUNTER — Ambulatory Visit (INDEPENDENT_AMBULATORY_CARE_PROVIDER_SITE_OTHER): Payer: BC Managed Care – PPO | Admitting: Neurology

## 2021-02-21 DIAGNOSIS — G43711 Chronic migraine without aura, intractable, with status migrainosus: Secondary | ICD-10-CM

## 2021-02-21 NOTE — Progress Notes (Signed)
02/21/2021:  See notes for details. She reconciled with her husband.Her one daughter is in [place] and Social research officer, government and had a hard time first year of college but is ok now.   Consent Form Botulism Toxin Injection For Chronic Migraine    Reviewed orally with patient, additionally signature is on file:  Botulism toxin has been approved by the Federal drug administration for treatment of chronic migraine. Botulism toxin does not cure chronic migraine and it may not be effective in some patients.  The administration of botulism toxin is accomplished by injecting a small amount of toxin into the muscles of the neck and head. Dosage must be titrated for each individual. Any benefits resulting from botulism toxin tend to wear off after 3 months with a repeat injection required if benefit is to be maintained. Injections are usually done every 3-4 months with maximum effect peak achieved by about 2 or 3 weeks. Botulism toxin is expensive and you should be sure of what costs you will incur resulting from the injection.  The side effects of botulism toxin use for chronic migraine may include:   -Transient, and usually mild, facial weakness with facial injections  -Transient, and usually mild, head or neck weakness with head/neck injections  -Reduction or loss of forehead facial animation due to forehead muscle weakness  -Eyelid drooping  -Dry eye  -Pain at the site of injection or bruising at the site of injection  -Double vision  -Potential unknown long term risks  Contraindications: You should not have Botox if you are pregnant, nursing, allergic to albumin, have an infection, skin condition, or muscle weakness at the site of the injection, or have myasthenia gravis, Lambert-Eaton syndrome, or ALS.  It is also possible that as with any injection, there may be an allergic reaction or no effect from the medication. Reduced effectiveness after repeated injections is sometimes seen and rarely  infection at the injection site may occur. All care will be taken to prevent these side effects. If therapy is given over a long time, atrophy and wasting in the muscle injected may occur. Occasionally the patient's become refractory to treatment because they develop antibodies to the toxin. In this event, therapy needs to be modified.  I have read the above information and consent to the administration of botulism toxin.    BOTOX PROCEDURE NOTE FOR MIGRAINE HEADACHE    Contraindications and precautions discussed with patient(above). Aseptic procedure was observed and patient tolerated procedure. Procedure performed by Dr. Artemio Aly  The condition has existed for more than 6 months, and pt does not have a diagnosis of ALS, Myasthenia Gravis or Lambert-Eaton Syndrome.  Risks and benefits of injections discussed and pt agrees to proceed with the procedure.  Written consent obtained  These injections are medically necessary. Pt  receives good benefits from these injections. These injections do not cause sedations or hallucinations which the oral therapies may cause.  Description of procedure:  The patient was placed in a sitting position. The standard protocol was used for Botox as follows, with 5 units of Botox injected at each site:   -Procerus muscle, midline injection  -Corrugator muscle, bilateral injection  -Frontalis muscle, bilateral injection, with 2 sites each side, medial injection was performed in the upper one third of the frontalis muscle, in the region vertical from the medial inferior edge of the superior orbital rim. The lateral injection was again in the upper one third of the forehead vertically above the lateral limbus of the cornea,  1.5 cm lateral to the medial injection site.  -Temporalis muscle injection, 4 sites, bilaterally. The first injection was 3 cm above the tragus of the ear, second injection site was 1.5 cm to 3 cm up from the first injection site in line with  the tragus of the ear. The third injection site was 1.5-3 cm forward between the first 2 injection sites. The fourth injection site was 1.5 cm posterior to the second injection site.   -Occipitalis muscle injection, 3 sites, bilaterally. The first injection was done one half way between the occipital protuberance and the tip of the mastoid process behind the ear. The second injection site was done lateral and superior to the first, 1 fingerbreadth from the first injection. The third injection site was 1 fingerbreadth superiorly and medially from the first injection site.  -Cervical paraspinal muscle injection, 2 sites, bilateral knee first injection site was 1 cm from the midline of the cervical spine, 3 cm inferior to the lower border of the occipital protuberance. The second injection site was 1.5 cm superiorly and laterally to the first injection site.  -Trapezius muscle injection was performed at 3 sites, bilaterally. The first injection site was in the upper trapezius muscle halfway between the inflection point of the neck, and the acromion. The second injection site was one half way between the acromion and the first injection site. The third injection was done between the first injection site and the inflection point of the neck.   Will return for repeat injection in 3 months.   155 units of Botox was used, any of the 45U Botox not used were injected was wasted. The patient tolerated the procedure well, there were no complications of the above procedure.

## 2021-02-21 NOTE — Progress Notes (Signed)
Botox- 100 units x 2 vials Lot: [id number] Expiration: 04/2023 NDC: 7858-8502-77  Lot: [id number] Expiration: 05/2023 NDC: 7672-0947-09  Bacteriostatic 0.9% Sodium Chloride- 25mL total Lot: [id number] Expiration: 06/11/2022 NDC: 2947-6546-50  Dx: P54.656 S/P

## 2021-03-07 DIAGNOSIS — M79632 Pain in left forearm: Secondary | ICD-10-CM | POA: Diagnosis not present

## 2021-03-07 DIAGNOSIS — M79631 Pain in right forearm: Secondary | ICD-10-CM | POA: Diagnosis not present

## 2021-03-22 DIAGNOSIS — K624 Stenosis of anus and rectum: Secondary | ICD-10-CM | POA: Diagnosis not present

## 2021-03-22 DIAGNOSIS — K648 Other hemorrhoids: Secondary | ICD-10-CM | POA: Diagnosis not present

## 2021-03-22 DIAGNOSIS — K602 Anal fissure, unspecified: Secondary | ICD-10-CM | POA: Diagnosis not present

## 2021-04-20 ENCOUNTER — Telehealth: Payer: Self-pay | Admitting: Neurology

## 2021-04-20 NOTE — Telephone Encounter (Signed)
Patient has Botox appt in December. Botox BCBS auth expired 03/14/21. I completed new PA form and faxed to [facility]. Pending.

## 2021-04-24 MED ORDER — BOTOX 100 UNITS IJ SOLR: INTRAMUSCULAR | 2 refills | Status: DC

## 2021-04-24 NOTE — Telephone Encounter (Signed)
Received approval from [facility]. PA #[id number] (04/20/21- 03/21/22).

## 2021-04-24 NOTE — Addendum Note (Signed)
Addended by: Bertram Savin on: 04/24/2021 02:46 PM   Modules accepted: Orders

## 2021-05-01 DIAGNOSIS — J029 Acute pharyngitis, unspecified: Secondary | ICD-10-CM | POA: Diagnosis not present

## 2021-05-01 DIAGNOSIS — D869 Sarcoidosis, unspecified: Secondary | ICD-10-CM | POA: Diagnosis not present

## 2021-05-01 DIAGNOSIS — R051 Acute cough: Secondary | ICD-10-CM | POA: Diagnosis not present

## 2021-05-23 ENCOUNTER — Other Ambulatory Visit: Payer: Self-pay

## 2021-05-23 ENCOUNTER — Ambulatory Visit (INDEPENDENT_AMBULATORY_CARE_PROVIDER_SITE_OTHER): Payer: 59 | Admitting: Neurology

## 2021-05-23 DIAGNOSIS — G43711 Chronic migraine without aura, intractable, with status migrainosus: Secondary | ICD-10-CM | POA: Diagnosis not present

## 2021-05-23 NOTE — Telephone Encounter (Signed)
Thank you Taylor!

## 2021-05-23 NOTE — Progress Notes (Signed)
05/23/2021:  She reconciled with her husband.Her one daughter is in [place] and Social research officer, government and had a hard time first year of college but is ok now.she just got a dog, a doodle blend 16 pounds. Her daughter. Doing spectacular, > 60% reduced in migraine freq and severity   Consent Form Botulism Toxin Injection For Chronic Migraine    Reviewed orally with patient, additionally signature is on file:  Botulism toxin has been approved by the Federal drug administration for treatment of chronic migraine. Botulism toxin does not cure chronic migraine and it may not be effective in some patients.  The administration of botulism toxin is accomplished by injecting a small amount of toxin into the muscles of the neck and head. Dosage must be titrated for each individual. Any benefits resulting from botulism toxin tend to wear off after 3 months with a repeat injection required if benefit is to be maintained. Injections are usually done every 3-4 months with maximum effect peak achieved by about 2 or 3 weeks. Botulism toxin is expensive and you should be sure of what costs you will incur resulting from the injection.  The side effects of botulism toxin use for chronic migraine may include:   -Transient, and usually mild, facial weakness with facial injections  -Transient, and usually mild, head or neck weakness with head/neck injections  -Reduction or loss of forehead facial animation due to forehead muscle weakness  -Eyelid drooping  -Dry eye  -Pain at the site of injection or bruising at the site of injection  -Double vision  -Potential unknown long term risks  Contraindications: You should not have Botox if you are pregnant, nursing, allergic to albumin, have an infection, skin condition, or muscle weakness at the site of the injection, or have myasthenia gravis, Lambert-Eaton syndrome, or ALS.  It is also possible that as with any injection, there may be an allergic reaction or no effect  from the medication. Reduced effectiveness after repeated injections is sometimes seen and rarely infection at the injection site may occur. All care will be taken to prevent these side effects. If therapy is given over a long time, atrophy and wasting in the muscle injected may occur. Occasionally the patient's become refractory to treatment because they develop antibodies to the toxin. In this event, therapy needs to be modified.  I have read the above information and consent to the administration of botulism toxin.    BOTOX PROCEDURE NOTE FOR MIGRAINE HEADACHE    Contraindications and precautions discussed with patient(above). Aseptic procedure was observed and patient tolerated procedure. Procedure performed by Dr. Artemio Aly  The condition has existed for more than 6 months, and pt does not have a diagnosis of ALS, Myasthenia Gravis or Lambert-Eaton Syndrome.  Risks and benefits of injections discussed and pt agrees to proceed with the procedure.  Written consent obtained  These injections are medically necessary. Pt  receives good benefits from these injections. These injections do not cause sedations or hallucinations which the oral therapies may cause.  Description of procedure:  The patient was placed in a sitting position. The standard protocol was used for Botox as follows, with 5 units of Botox injected at each site:   -Procerus muscle, midline injection  -Corrugator muscle, bilateral injection  -Frontalis muscle, bilateral injection, with 2 sites each side, medial injection was performed in the upper one third of the frontalis muscle, in the region vertical from the medial inferior edge of the superior orbital rim. The lateral injection was  again in the upper one third of the forehead vertically above the lateral limbus of the cornea, 1.5 cm lateral to the medial injection site.  -Temporalis muscle injection, 4 sites, bilaterally. The first injection was 3 cm above the tragus  of the ear, second injection site was 1.5 cm to 3 cm up from the first injection site in line with the tragus of the ear. The third injection site was 1.5-3 cm forward between the first 2 injection sites. The fourth injection site was 1.5 cm posterior to the second injection site.   -Occipitalis muscle injection, 3 sites, bilaterally. The first injection was done one half way between the occipital protuberance and the tip of the mastoid process behind the ear. The second injection site was done lateral and superior to the first, 1 fingerbreadth from the first injection. The third injection site was 1 fingerbreadth superiorly and medially from the first injection site.  -Cervical paraspinal muscle injection, 2 sites, bilateral knee first injection site was 1 cm from the midline of the cervical spine, 3 cm inferior to the lower border of the occipital protuberance. The second injection site was 1.5 cm superiorly and laterally to the first injection site.  -Trapezius muscle injection was performed at 3 sites, bilaterally. The first injection site was in the upper trapezius muscle halfway between the inflection point of the neck, and the acromion. The second injection site was one half way between the acromion and the first injection site. The third injection was done between the first injection site and the inflection point of the neck.   Will return for repeat injection in 3 months.   155 units of Botox was used, any of the 45U Botox not used were injected was wasted. The patient tolerated the procedure well, there were no complications of the above procedure.

## 2021-05-23 NOTE — Progress Notes (Signed)
Botox- 200 units x 1 vial Lot: [id number] Expiration: 02/2024 NDC: 5909-3112-16  Bacteriostatic 0.9% Sodium Chloride- 39mL total Lot: [id number] Expiration: 06/11/2022 NDC: 0722-5750-51  Dx: G33.582 B/B

## 2021-05-23 NOTE — Telephone Encounter (Signed)
Patient had a Botox appointment today. At check-in, she presented with 2 additional insurance cards to add in her chart. Patient previously only had coverage under Medicare A & B and BCBS [place] (primary). Upon review, BCBS is patient's primary insurance, with Medicare as a secondary. [employer] is now her third coverage & a supplemental insurance is her 4th. Patient did not inform staff of insurance change prior to her appointment. Because of this, changes needed to be made to Botox prior authorizations (today) to ensure smooth billing process/paid claims post injection. I spoke with Angie in billing. She informed me that if the patient has active coverage under each insurance, we have to file each insurance accordingly.   Patient previously utilized [facility] to obtain Botox for her injections. Adding these additional insurances will create an issue with billing for our office as well as the specialty pharmacy (each insurance has its own requirements for obtaining Botox; how to obtain/where to obtain). Because of this conflict, I called [employer] to update patient's current PA to buy & bill instead of specialty pharmacy. I then called [facility] to cancel patient's current prescription and shipment scheduled to arrive within the week.  Additionally, patient's [facility] coverage requires an authorization for Botox. I called [employer] PA line @ [phone] and spoke with Jeralyn Bennett to initiate PA. Advised PA will need to be for buy & bill due to patient having Medicare & other commercial plans. Jeralyn Bennett was able to approve the request for CPT J0585, utilizing 155 units every 12 weeks for 1 year. PA #[id number] (05/23/21- 05/23/22).  Patient's Medicare insurance as well as her supplemental added today do not require authorization for Botox.

## 2021-05-29 NOTE — Telephone Encounter (Signed)
Received updated PA letter from [facility]. PA is now for buy and bill. New PA number issued. PA #[id number] (05/23/21- 04/23/22).

## 2021-07-02 IMAGING — US US PELVIS COMPLETE TRANSABD/TRANSVAG W DUPLEX
1 series · 13 of 25 positions shown · non-contrast
Comparison: CT of the abdomen and pelvis on 08/22/2017

CLINICAL DATA: LEFT pelvic pain.

EXAM:
TRANSABDOMINAL AND TRANSVAGINAL ULTRASOUND OF PELVIS
DOPPLER ULTRASOUND OF OVARIES
TECHNIQUE: Both transabdominal and transvaginal ultrasound examinations of the
pelvis were performed. Transabdominal technique was performed for
global imaging of the pelvis including uterus, ovaries, adnexal
regions, and pelvic cul-de-sac.
It was necessary to proceed with endovaginal exam following the
transabdominal exam to visualize the uterus, endometrium, ovaries,
adnexal regions. Color and duplex Doppler ultrasound was utilized to
evaluate blood flow to the ovaries.

[Series 1: us pelvis complete transabd/transvag w duplex · 13 of 77 slices shown]
[im 1/77]
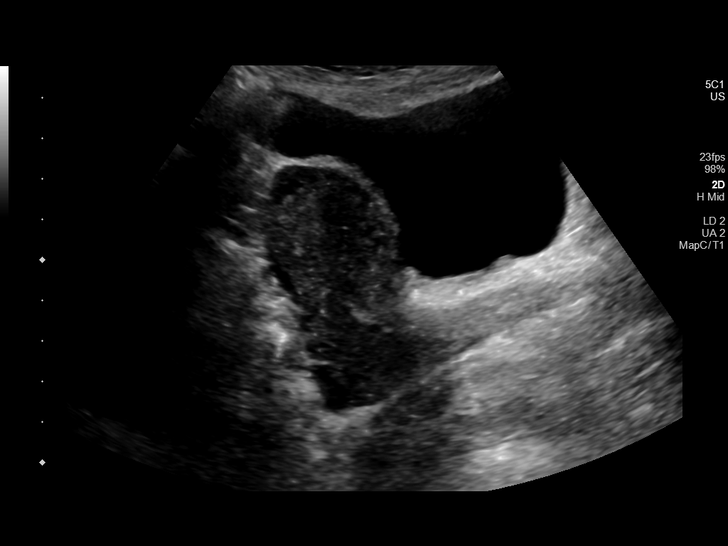
[im 7/77]
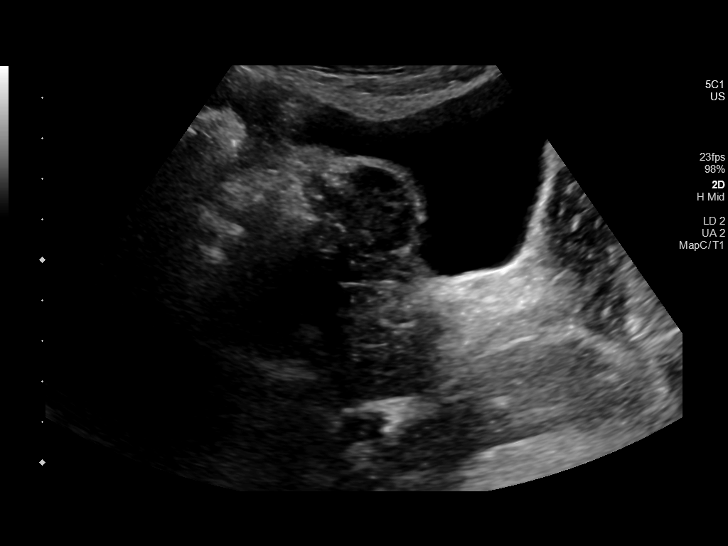
[im 13/77]
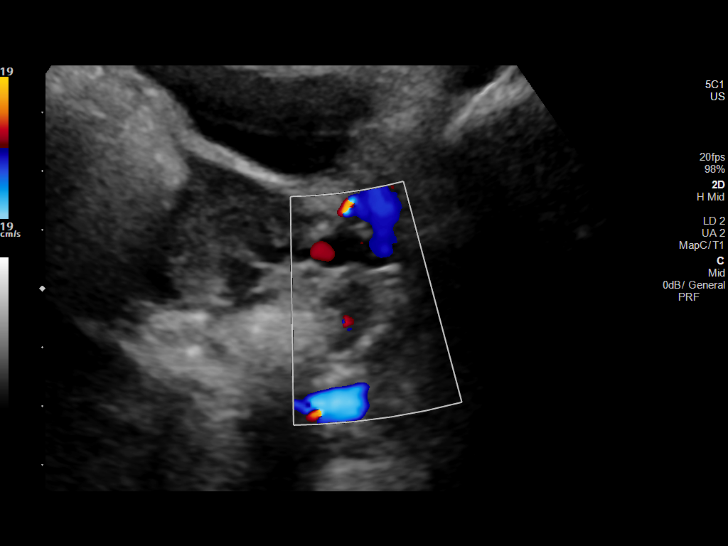
[im 20/77]
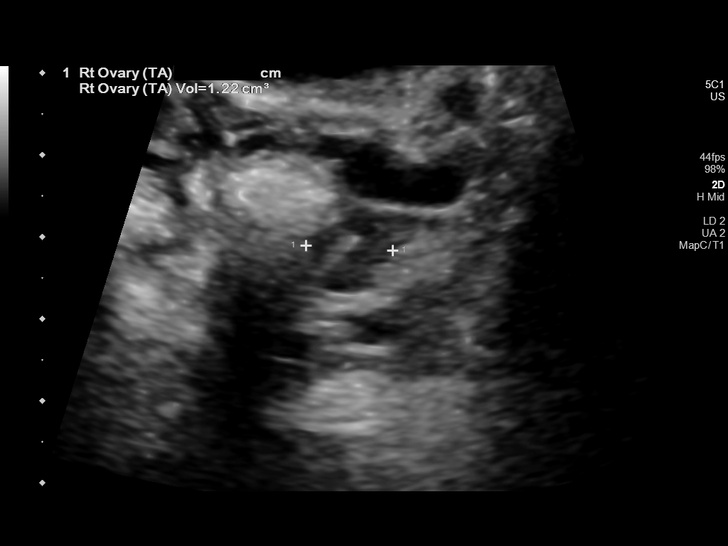
[im 26/77]
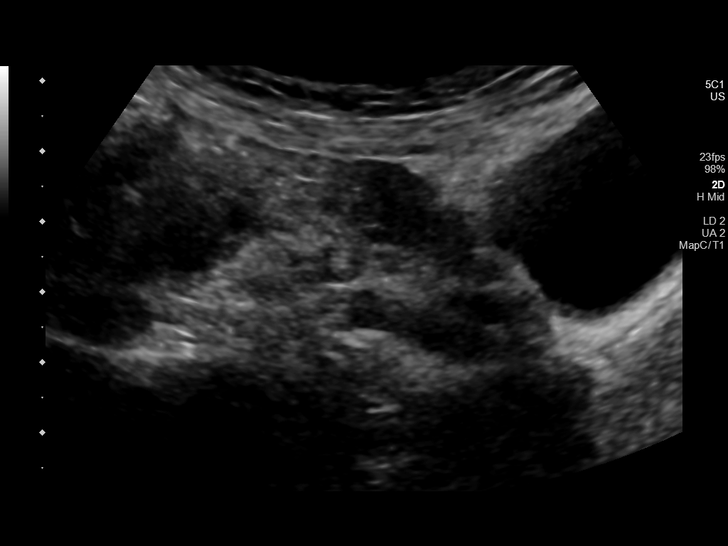
[im 32/77]
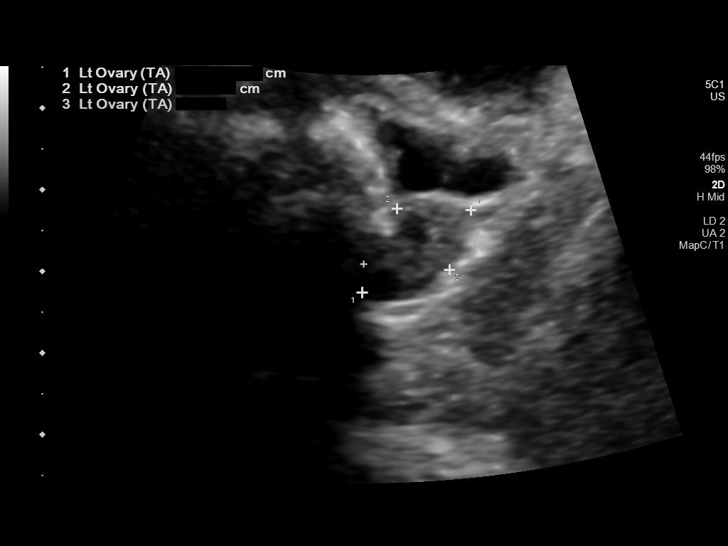
[im 39/77]
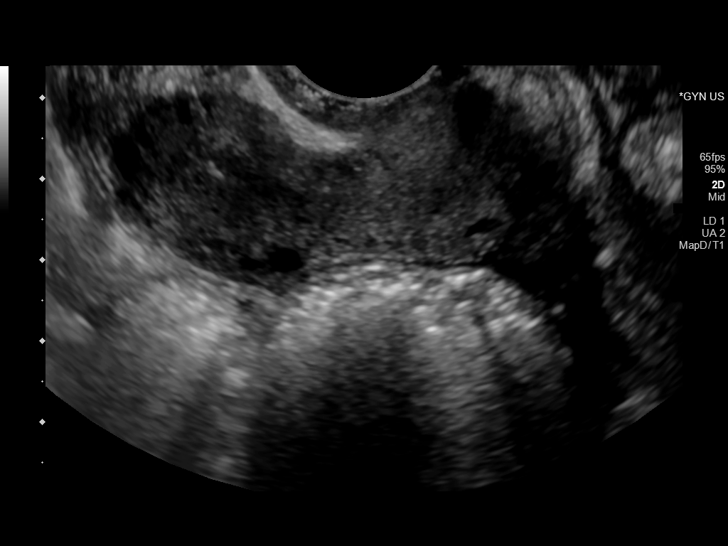
[im 45/77]
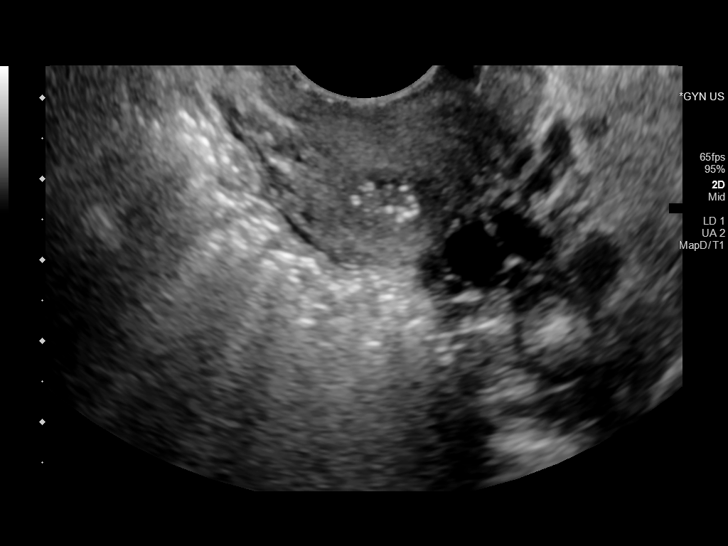
[im 51/77]
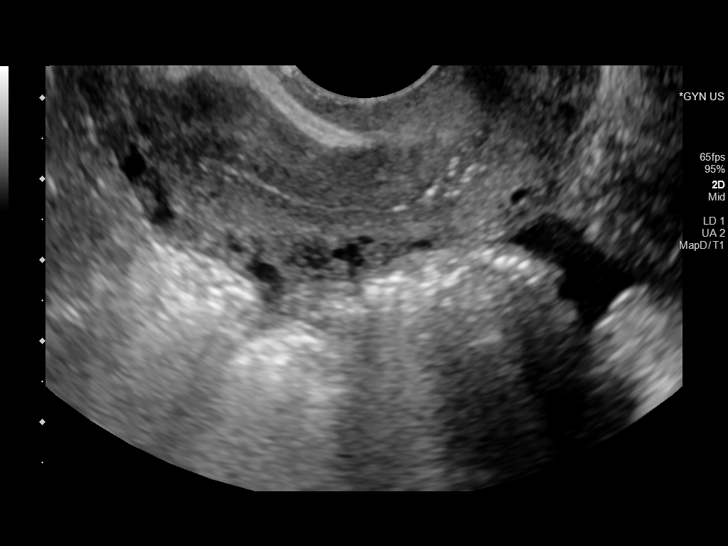
[im 58/77]
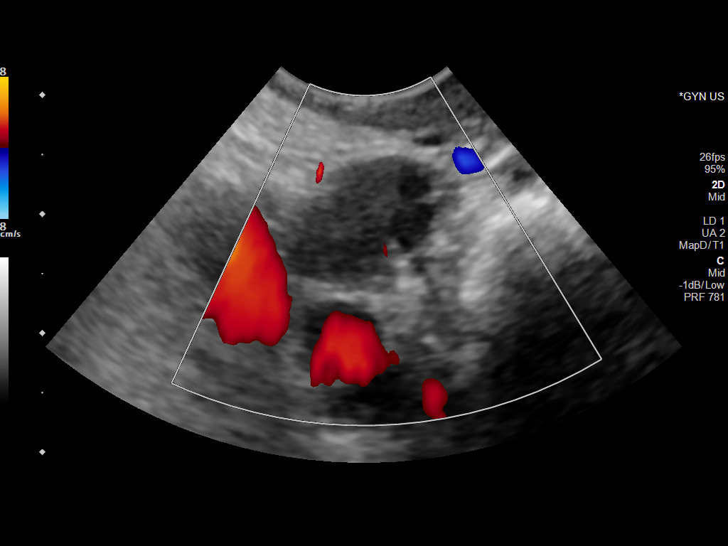
[im 64/77]
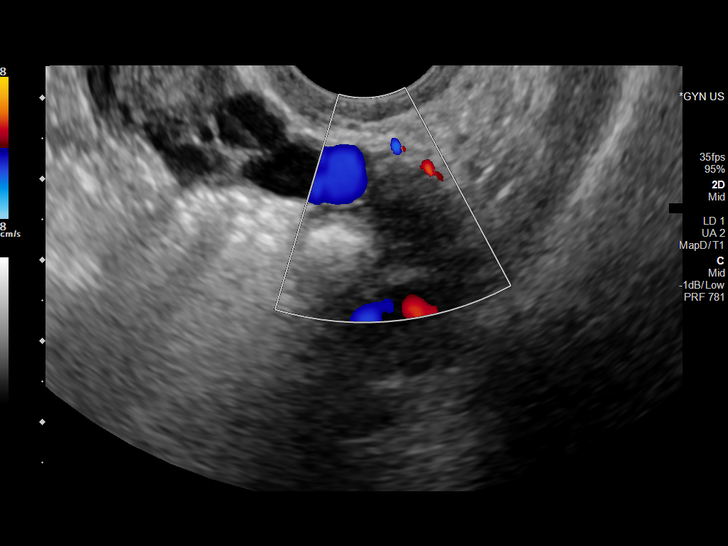
[im 70/77]
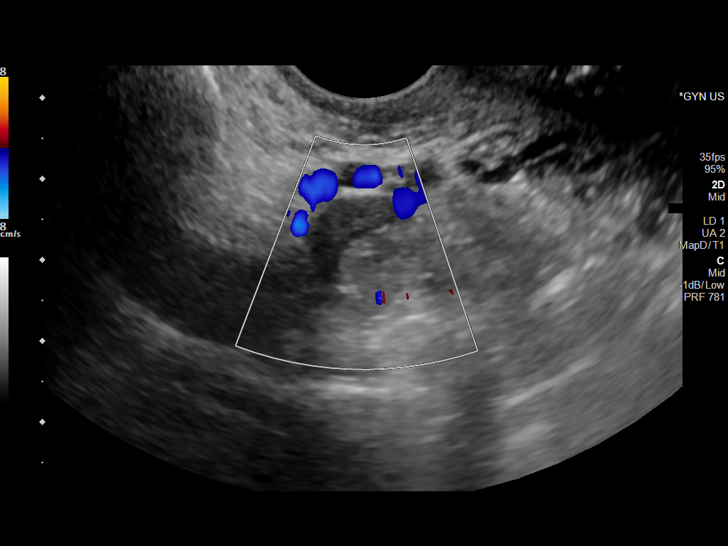
[im 77/77]
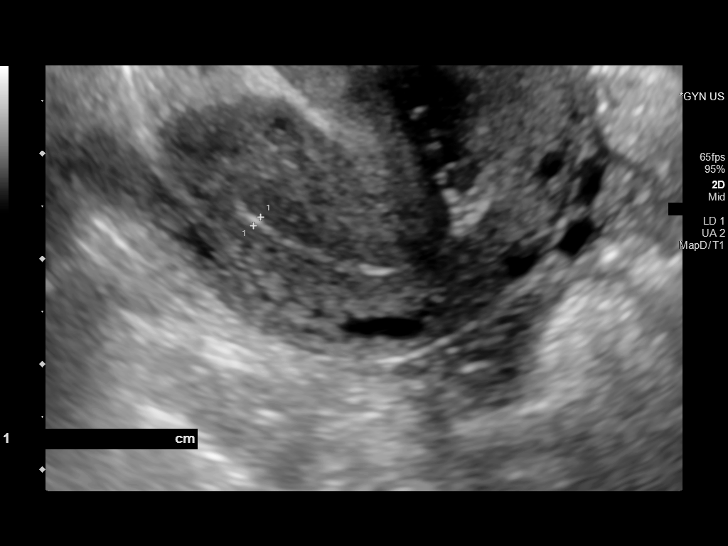

[13 of 25 positions shown; findings below may reference images not displayed]

FINDINGS: Uterus

Measurements: 6.2 x 3.3 x 4.9 centimeters = volume: 51.9 mL. Uterus
is anteflexed. No fibroids or other mass.

Endometrium

Thickness: 1.1 millimeters.  No focal abnormality visualized.

Right ovary

Measurements: 2.1 x 1.2 x 1.7 centimeter = volume: 2.2 mL. Normal
appearance/no adnexal mass.

Left ovary

Measurements: 1.7 x 0.8 x 1.1 centimeters = volume: 0.8 mL. Normal
appearance/no adnexal mass.

Pulsed Doppler evaluation of both ovaries demonstrates normal
low-resistance arterial and venous waveforms.

Other findings

Trace free pelvic fluid.
IMPRESSION: Normal pelvic ultrasound.  No adnexal mass or evidence for torsion.

## 2021-08-22 ENCOUNTER — Ambulatory Visit (INDEPENDENT_AMBULATORY_CARE_PROVIDER_SITE_OTHER): Payer: 59 | Admitting: Neurology

## 2021-08-22 DIAGNOSIS — G43711 Chronic migraine without aura, intractable, with status migrainosus: Secondary | ICD-10-CM | POA: Diagnosis not present

## 2021-08-22 MED ORDER — HYDROCODONE-ACETAMINOPHEN 5-325 MG PO TABS: 1.0000 | ORAL_TABLET | Freq: Four times a day (QID) | ORAL | 0 refills | Status: DC | PRN

## 2021-08-22 MED ORDER — TIZANIDINE HCL 4 MG PO TABS: 4.0000 mg | ORAL_TABLET | Freq: Four times a day (QID) | ORAL | 3 refills | Status: DC | PRN

## 2021-08-22 NOTE — Progress Notes (Signed)
Botox- 200 units x 1 vial ?Lot: [id number] ?Expiration: 02/2024 ?NDC: 0023-3921-02 ? ?Bacteriostatic 0.9% Sodium Chloride- 4mL total ?Lot: [id number] ?Expiration: 01/10/2023 ?NDC: 0409-1966-02 ? ?Dx: G43.711 ?B/B ? ?

## 2021-08-22 NOTE — Progress Notes (Signed)
08/22/2021: Stable ?05/23/2021:  She reconciled with her husband.Her one daughter is in [place] and Community education officer and had a hard time first year of college but is ok now.she just got a dog, a doodle blend 16 pounds. Her daughter. Doing spectacular, > 60% reduced in migraine freq and severity ? ? ?Consent Form ?Botulism Toxin Injection For Chronic Migraine ? ? ? ?Reviewed orally with patient, additionally signature is on file: ? ?Botulism toxin has been approved by the Federal drug administration for treatment of chronic migraine. Botulism toxin does not cure chronic migraine and it may not be effective in some patients. ? ?The administration of botulism toxin is accomplished by injecting a small amount of toxin into the muscles of the neck and head. Dosage must be titrated for each individual. Any benefits resulting from botulism toxin tend to wear off after 3 months with a repeat injection required if benefit is to be maintained. Injections are usually done every 3-4 months with maximum effect peak achieved by about 2 or 3 weeks. Botulism toxin is expensive and you should be sure of what costs you will incur resulting from the injection. ? ?The side effects of botulism toxin use for chronic migraine may include: ? ? -Transient, and usually mild, facial weakness with facial injections ? -Transient, and usually mild, head or neck weakness with head/neck injections ? -Reduction or loss of forehead facial animation due to forehead muscle weakness ? -Eyelid drooping ? -Dry eye ? -Pain at the site of injection or bruising at the site of injection ? -Double vision ? -Potential unknown long term risks ? ?Contraindications: You should not have Botox if you are pregnant, nursing, allergic to albumin, have an infection, skin condition, or muscle weakness at the site of the injection, or have myasthenia gravis, Lambert-Eaton syndrome, or ALS. ? ?It is also possible that as with any injection, there may be an allergic  reaction or no effect from the medication. Reduced effectiveness after repeated injections is sometimes seen and rarely infection at the injection site may occur. All care will be taken to prevent these side effects. If therapy is given over a long time, atrophy and wasting in the muscle injected may occur. Occasionally the patient's become refractory to treatment because they develop antibodies to the toxin. In this event, therapy needs to be modified. ? ?I have read the above information and consent to the administration of botulism toxin. ? ? ? ?BOTOX PROCEDURE NOTE FOR MIGRAINE HEADACHE ? ? ? ?Contraindications and precautions discussed with patient(above). Aseptic procedure was observed and patient tolerated procedure. Procedure performed by Dr. Georgia Dom ? ?The condition has existed for more than 6 months, and pt does not have a diagnosis of ALS, Myasthenia Gravis or Lambert-Eaton Syndrome.  Risks and benefits of injections discussed and pt agrees to proceed with the procedure.  Written consent obtained ? ?These injections are medically necessary. Pt  receives good benefits from these injections. These injections do not cause sedations or hallucinations which the oral therapies may cause. ? ?Description of procedure: ? ?The patient was placed in a sitting position. The standard protocol was used for Botox as follows, with 5 units of Botox injected at each site: ? ? ?-Procerus muscle, midline injection ? ?-Corrugator muscle, bilateral injection ? ?-Frontalis muscle, bilateral injection, with 2 sites each side, medial injection was performed in the upper one third of the frontalis muscle, in the region vertical from the medial inferior edge of the superior orbital rim. The lateral  injection was again in the upper one third of the forehead vertically above the lateral limbus of the cornea, 1.5 cm lateral to the medial injection site. ? ?-Temporalis muscle injection, 4 sites, bilaterally. The first injection was  3 cm above the tragus of the ear, second injection site was 1.5 cm to 3 cm up from the first injection site in line with the tragus of the ear. The third injection site was 1.5-3 cm forward between the first 2 injection sites. The fourth injection site was 1.5 cm posterior to the second injection site.  ? ?-Occipitalis muscle injection, 3 sites, bilaterally. The first injection was done one half way between the occipital protuberance and the tip of the mastoid process behind the ear. The second injection site was done lateral and superior to the first, 1 fingerbreadth from the first injection. The third injection site was 1 fingerbreadth superiorly and medially from the first injection site. ? ?-Cervical paraspinal muscle injection, 2 sites, bilateral knee first injection site was 1 cm from the midline of the cervical spine, 3 cm inferior to the lower border of the occipital protuberance. The second injection site was 1.5 cm superiorly and laterally to the first injection site. ? ?-Trapezius muscle injection was performed at 3 sites, bilaterally. The first injection site was in the upper trapezius muscle halfway between the inflection point of the neck, and the acromion. The second injection site was one half way between the acromion and the first injection site. The third injection was done between the first injection site and the inflection point of the neck. ? ? ?Will return for repeat injection in 3 months. ? ? ?155 units of Botox was used, any of the 45U Botox not used were injected was wasted. The patient tolerated the procedure well, there were no complications of the above procedure. ? ? ? ?

## 2021-11-14 ENCOUNTER — Ambulatory Visit (INDEPENDENT_AMBULATORY_CARE_PROVIDER_SITE_OTHER): Payer: 59 | Admitting: Neurology

## 2021-11-14 DIAGNOSIS — G43711 Chronic migraine without aura, intractable, with status migrainosus: Secondary | ICD-10-CM

## 2021-11-14 NOTE — Progress Notes (Signed)
Botox- 200 units x 1 vial Lot: [id number] Expiration: 05/2024 NDC: 0023-3921-02  Bacteriostatic 0.9% Sodium Chloride- 4mL total Lot: [id number] Expiration: 01/10/2023 NDC: 0409-1966-02  Dx: G43.711 B/B 

## 2021-11-14 NOTE — Progress Notes (Signed)
11/14/2021: She is doing well, stable > 70% improvement in migraines and headache frequency and severity. They bougt her daughter and emotional support dog.  08/22/2021: Stable 05/23/2021:  She reconciled with her husband.Her one daughter is in [place] and Social research officer, government and had a hard time first year of college but is ok now.she just got a dog, a doodle blend 16 pounds. Her daughter. Doing spectacular, > 60% reduced in migraine freq and severity   Consent Form Botulism Toxin Injection For Chronic Migraine    Reviewed orally with patient, additionally signature is on file:  Botulism toxin has been approved by the Federal drug administration for treatment of chronic migraine. Botulism toxin does not cure chronic migraine and it may not be effective in some patients.  The administration of botulism toxin is accomplished by injecting a small amount of toxin into the muscles of the neck and head. Dosage must be titrated for each individual. Any benefits resulting from botulism toxin tend to wear off after 3 months with a repeat injection required if benefit is to be maintained. Injections are usually done every 3-4 months with maximum effect peak achieved by about 2 or 3 weeks. Botulism toxin is expensive and you should be sure of what costs you will incur resulting from the injection.  The side effects of botulism toxin use for chronic migraine may include:   -Transient, and usually mild, facial weakness with facial injections  -Transient, and usually mild, head or neck weakness with head/neck injections  -Reduction or loss of forehead facial animation due to forehead muscle weakness  -Eyelid drooping  -Dry eye  -Pain at the site of injection or bruising at the site of injection  -Double vision  -Potential unknown long term risks  Contraindications: You should not have Botox if you are pregnant, nursing, allergic to albumin, have an infection, skin condition, or muscle weakness at the site  of the injection, or have myasthenia gravis, Lambert-Eaton syndrome, or ALS.  It is also possible that as with any injection, there may be an allergic reaction or no effect from the medication. Reduced effectiveness after repeated injections is sometimes seen and rarely infection at the injection site may occur. All care will be taken to prevent these side effects. If therapy is given over a long time, atrophy and wasting in the muscle injected may occur. Occasionally the patient's become refractory to treatment because they develop antibodies to the toxin. In this event, therapy needs to be modified.  I have read the above information and consent to the administration of botulism toxin.    BOTOX PROCEDURE NOTE FOR MIGRAINE HEADACHE    Contraindications and precautions discussed with patient(above). Aseptic procedure was observed and patient tolerated procedure. Procedure performed by Dr. Artemio Aly  The condition has existed for more than 6 months, and pt does not have a diagnosis of ALS, Myasthenia Gravis or Lambert-Eaton Syndrome.  Risks and benefits of injections discussed and pt agrees to proceed with the procedure.  Written consent obtained  These injections are medically necessary. Pt  receives good benefits from these injections. These injections do not cause sedations or hallucinations which the oral therapies may cause.  Description of procedure:  The patient was placed in a sitting position. The standard protocol was used for Botox as follows, with 5 units of Botox injected at each site:   -Procerus muscle, midline injection  -Corrugator muscle, bilateral injection  -Frontalis muscle, bilateral injection, with 2 sites each side, medial injection was performed  in the upper one third of the frontalis muscle, in the region vertical from the medial inferior edge of the superior orbital rim. The lateral injection was again in the upper one third of the forehead vertically above the  lateral limbus of the cornea, 1.5 cm lateral to the medial injection site.  -Temporalis muscle injection, 4 sites, bilaterally. The first injection was 3 cm above the tragus of the ear, second injection site was 1.5 cm to 3 cm up from the first injection site in line with the tragus of the ear. The third injection site was 1.5-3 cm forward between the first 2 injection sites. The fourth injection site was 1.5 cm posterior to the second injection site.   -Occipitalis muscle injection, 3 sites, bilaterally. The first injection was done one half way between the occipital protuberance and the tip of the mastoid process behind the ear. The second injection site was done lateral and superior to the first, 1 fingerbreadth from the first injection. The third injection site was 1 fingerbreadth superiorly and medially from the first injection site.  -Cervical paraspinal muscle injection, 2 sites, bilateral knee first injection site was 1 cm from the midline of the cervical spine, 3 cm inferior to the lower border of the occipital protuberance. The second injection site was 1.5 cm superiorly and laterally to the first injection site.  -Trapezius muscle injection was performed at 3 sites, bilaterally. The first injection site was in the upper trapezius muscle halfway between the inflection point of the neck, and the acromion. The second injection site was one half way between the acromion and the first injection site. The third injection was done between the first injection site and the inflection point of the neck.   Will return for repeat injection in 3 months.   155 units of Botox was used, any of the 45U Botox not used were injected was wasted. The patient tolerated the procedure well, there were no complications of the above procedure.

## 2021-11-30 ENCOUNTER — Other Ambulatory Visit: Payer: Self-pay | Admitting: Obstetrics and Gynecology

## 2021-11-30 DIAGNOSIS — M8580 Other specified disorders of bone density and structure, unspecified site: Secondary | ICD-10-CM

## 2022-02-06 ENCOUNTER — Ambulatory Visit (INDEPENDENT_AMBULATORY_CARE_PROVIDER_SITE_OTHER): Payer: 59 | Admitting: Neurology

## 2022-02-06 ENCOUNTER — Encounter: Payer: Self-pay | Admitting: Neurology

## 2022-02-06 DIAGNOSIS — G43711 Chronic migraine without aura, intractable, with status migrainosus: Secondary | ICD-10-CM

## 2022-02-06 MED ORDER — ONABOTULINUMTOXINA 200 UNITS IJ SOLR
155.0000 [IU] | Freq: Once | INTRAMUSCULAR | Status: AC
  Administered 2022-02-06: 155 [IU] via INTRAMUSCULAR

## 2022-02-06 NOTE — Progress Notes (Signed)
02/06/2022: Stable, doing well on botox, daughter just brought the support dog to school 10 pounds, cocker spaniel and a poodle, she sent me info on bigger dogs but they decided on a smaller dog 11/14/2021: She is doing well, stable > 70% improvement in migraines and headache frequency and severity. They bougt her daughter an emotional support dog.  08/22/2021: Stable 05/23/2021:  She reconciled with her husband.Her one daughter is in [place] and Social research officer, government and had a hard time first year of college but is ok now.she just got a dog, a doodle blend. Her daughter. Patient Doing spectacular on botox, > 60% reduced in migraine freq and severity   Consent Form Botulism Toxin Injection For Chronic Migraine    Reviewed orally with patient, additionally signature is on file:  Botulism toxin has been approved by the Federal drug administration for treatment of chronic migraine. Botulism toxin does not cure chronic migraine and it may not be effective in some patients.  The administration of botulism toxin is accomplished by injecting a small amount of toxin into the muscles of the neck and head. Dosage must be titrated for each individual. Any benefits resulting from botulism toxin tend to wear off after 3 months with a repeat injection required if benefit is to be maintained. Injections are usually done every 3-4 months with maximum effect peak achieved by about 2 or 3 weeks. Botulism toxin is expensive and you should be sure of what costs you will incur resulting from the injection.  The side effects of botulism toxin use for chronic migraine may include:   -Transient, and usually mild, facial weakness with facial injections  -Transient, and usually mild, head or neck weakness with head/neck injections  -Reduction or loss of forehead facial animation due to forehead muscle weakness  -Eyelid drooping  -Dry eye  -Pain at the site of injection or bruising at the site of injection  -Double  vision  -Potential unknown long term risks  Contraindications: You should not have Botox if you are pregnant, nursing, allergic to albumin, have an infection, skin condition, or muscle weakness at the site of the injection, or have myasthenia gravis, Lambert-Eaton syndrome, or ALS.  It is also possible that as with any injection, there may be an allergic reaction or no effect from the medication. Reduced effectiveness after repeated injections is sometimes seen and rarely infection at the injection site may occur. All care will be taken to prevent these side effects. If therapy is given over a long time, atrophy and wasting in the muscle injected may occur. Occasionally the patient's become refractory to treatment because they develop antibodies to the toxin. In this event, therapy needs to be modified.  I have read the above information and consent to the administration of botulism toxin.    BOTOX PROCEDURE NOTE FOR MIGRAINE HEADACHE    Contraindications and precautions discussed with patient(above). Aseptic procedure was observed and patient tolerated procedure. Procedure performed by Dr. Artemio Aly  The condition has existed for more than 6 months, and pt does not have a diagnosis of ALS, Myasthenia Gravis or Lambert-Eaton Syndrome.  Risks and benefits of injections discussed and pt agrees to proceed with the procedure.  Written consent obtained  These injections are medically necessary. Pt  receives good benefits from these injections. These injections do not cause sedations or hallucinations which the oral therapies may cause.  Description of procedure:  The patient was placed in a sitting position. The standard protocol was used for Botox  as follows, with 5 units of Botox injected at each site:   -Procerus muscle, midline injection  -Corrugator muscle, bilateral injection  -Frontalis muscle, bilateral injection, with 2 sites each side, medial injection was performed in the upper  one third of the frontalis muscle, in the region vertical from the medial inferior edge of the superior orbital rim. The lateral injection was again in the upper one third of the forehead vertically above the lateral limbus of the cornea, 1.5 cm lateral to the medial injection site.  -Temporalis muscle injection, 4 sites, bilaterally. The first injection was 3 cm above the tragus of the ear, second injection site was 1.5 cm to 3 cm up from the first injection site in line with the tragus of the ear. The third injection site was 1.5-3 cm forward between the first 2 injection sites. The fourth injection site was 1.5 cm posterior to the second injection site.   -Occipitalis muscle injection, 3 sites, bilaterally. The first injection was done one half way between the occipital protuberance and the tip of the mastoid process behind the ear. The second injection site was done lateral and superior to the first, 1 fingerbreadth from the first injection. The third injection site was 1 fingerbreadth superiorly and medially from the first injection site.  -Cervical paraspinal muscle injection, 2 sites, bilateral knee first injection site was 1 cm from the midline of the cervical spine, 3 cm inferior to the lower border of the occipital protuberance. The second injection site was 1.5 cm superiorly and laterally to the first injection site.  -Trapezius muscle injection was performed at 3 sites, bilaterally. The first injection site was in the upper trapezius muscle halfway between the inflection point of the neck, and the acromion. The second injection site was one half way between the acromion and the first injection site. The third injection was done between the first injection site and the inflection point of the neck.   Will return for repeat injection in 3 months.   155 units of Botox was used, any of the 45U Botox not used were injected was wasted. The patient tolerated the procedure well, there were no  complications of the above procedure.

## 2022-02-06 NOTE — Progress Notes (Signed)
Botox- 200 units x 1 vial Lot: [id number] Expiration: 07/2024 NDC: 0263-7858-85  Bacteriostatic 0.9% Sodium Chloride- 72mL total Lot: [id number] Expiration: 08/0/2024 NDC: 2878-6767-20  Dx: N47.096 B/B

## 2022-04-25 ENCOUNTER — Other Ambulatory Visit (HOSPITAL_COMMUNITY): Payer: Self-pay

## 2022-04-25 ENCOUNTER — Telehealth: Payer: Self-pay

## 2022-04-25 NOTE — Telephone Encounter (Signed)
Patient has new insurance - [employer]. It is e-verified in her chart but I'm not sure if she needs a new auth for botox through this new insurance . She cancelled her upcoming botox appointment and will call to reschedule it once she is back in town.

## 2022-04-25 NOTE — Telephone Encounter (Signed)
Chronic Migraine CPT 64615  Botox J0585 Units:200  G43.711 Chronic Migraine without aura, intractable, with status migrainous  

## 2022-05-01 ENCOUNTER — Ambulatory Visit: Payer: 59 | Admitting: Neurology

## 2022-05-22 ENCOUNTER — Inpatient Hospital Stay: Admission: RE | Admit: 2022-05-22 | Payer: Medicare Other | Source: Ambulatory Visit

## 2022-05-28 ENCOUNTER — Other Ambulatory Visit (HOSPITAL_COMMUNITY): Payer: Self-pay

## 2022-05-28 NOTE — Telephone Encounter (Signed)
Patient Advocate Encounter   Received notification that prior authorization for Botox 200UNIT solution is required.   PA submitted on 05/28/2022 Key [id number] Status is pending       Roland Earl, CPhT Pharmacy Patient Advocate Specialist [facility] Patient Advocate Team Direct Number: [phone]  Fax: [phone]

## 2022-05-29 NOTE — Telephone Encounter (Signed)
Received fax for additional information needed. Faxed to number provided on form: [phone]

## 2022-05-30 ENCOUNTER — Ambulatory Visit
Admission: RE | Admit: 2022-05-30 | Discharge: 2022-05-30 | Disposition: A | Discharge status: Home / Self Care | Payer: Medicare Other | Source: Ambulatory Visit | Attending: Obstetrics and Gynecology | Admitting: Obstetrics and Gynecology

## 2022-05-30 DIAGNOSIS — M8580 Other specified disorders of bone density and structure, unspecified site: Secondary | ICD-10-CM

## 2022-06-05 ENCOUNTER — Other Ambulatory Visit: Payer: Self-pay | Admitting: Neurology

## 2022-06-05 ENCOUNTER — Telehealth: Payer: Self-pay | Admitting: Neurology

## 2022-06-05 ENCOUNTER — Other Ambulatory Visit (HOSPITAL_COMMUNITY): Payer: Self-pay

## 2022-06-05 MED ORDER — BOTOX 100 UNITS IJ SOLR
INTRAMUSCULAR | 1 refills | Status: DC
Start: 2022-06-05 — End: 2024-03-25

## 2022-06-05 NOTE — Telephone Encounter (Signed)
Called and scheduled botox for 06/26/22 at 1 pm, pt will need new auth before then. Current auth on file is for [name] [name], she will have [employer] as of 06/11/22. She states she will upload her new card in MyChart today.

## 2022-06-05 NOTE — Telephone Encounter (Signed)
Ally from [facility] called to set up delivery on 06/14/22 for Viles: 2 Units: 100ea

## 2022-06-05 NOTE — Telephone Encounter (Signed)
Patient Advocate Encounter  Prior Authorization for Botox 200UNIT solution  has been approved.     Effective dates: 05/31/2022 through 06/01/2023  Must be filled through [facility] Fax:  [phone] Phone:  [phone]    Roland Earl, CPhT Pharmacy Patient Advocate Specialist [facility] Patient Advocate Team Direct Number: [phone]  Fax: [phone]

## 2022-06-06 NOTE — Telephone Encounter (Signed)
Pt will have [employer] as of 06/11/22. She states she will upload her new card in MyChart then PA can be completed for new yr.

## 2022-06-06 NOTE — Telephone Encounter (Signed)
Pt has appt sch 06/26/21

## 2022-06-07 NOTE — Telephone Encounter (Signed)
Appt note updated and I see Botox Rx was already sent to new pharmacy

## 2022-06-12 DIAGNOSIS — F411 Generalized anxiety disorder: Secondary | ICD-10-CM | POA: Diagnosis not present

## 2022-06-12 DIAGNOSIS — F902 Attention-deficit hyperactivity disorder, combined type: Secondary | ICD-10-CM | POA: Diagnosis not present

## 2022-06-12 DIAGNOSIS — F5112 Insufficient sleep syndrome: Secondary | ICD-10-CM | POA: Diagnosis not present

## 2022-06-14 DIAGNOSIS — R7989 Other specified abnormal findings of blood chemistry: Secondary | ICD-10-CM | POA: Diagnosis not present

## 2022-06-14 DIAGNOSIS — E7800 Pure hypercholesterolemia, unspecified: Secondary | ICD-10-CM | POA: Diagnosis not present

## 2022-06-19 ENCOUNTER — Ambulatory Visit: Payer: Medicare HMO | Admitting: Adult Health

## 2022-06-19 ENCOUNTER — Encounter: Payer: Self-pay | Admitting: Adult Health

## 2022-06-19 DIAGNOSIS — G43711 Chronic migraine without aura, intractable, with status migrainosus: Secondary | ICD-10-CM

## 2022-06-19 MED ORDER — ONABOTULINUMTOXINA 100 UNITS IJ SOLR
155.0000 [IU] | Freq: Once | INTRAMUSCULAR | Status: AC
  Administered 2022-06-19: 155 [IU] via INTRAMUSCULAR

## 2022-06-19 NOTE — Progress Notes (Signed)
06/19/2022: botox working well. Reports neck stiffnes and should tightness left > right   BOTOX PROCEDURE NOTE FOR MIGRAINE HEADACHE    Contraindications and precautions discussed with patient(above). Aseptic procedure was observed and patient tolerated procedure. Procedure performed by Ward Givens, NP  The condition has existed for more than 6 months, and pt does not have a diagnosis of ALS, Myasthenia Gravis or Lambert-Eaton Syndrome.  Risks and benefits of injections discussed and pt agrees to proceed with the procedure.  Written consent obtained  These injections are medically necessary. These injections do not cause sedations or hallucinations which the oral therapies may cause.  Indication/Diagnosis: chronic migraine BOTOX(J0585) injection was performed according to protocol by [facility]. 200 units of BOTOX was dissolved into 4 cc NS.   NDC: [address]-15  Type of toxin: Botox Botox- 100 units x 2 vials Lot: [id number] Expiration: 08/2024 NDC: 9741-6384-53   Bacteriostatic 0.9% Sodium Chloride- 10mL total Lot: [id number]     Expiration: 02/10/2023 NDC: 2122-4825-00   Dx: B70.488     Description of procedure:  The patient was placed in a sitting position. The standard protocol was used for Botox as follows, with 5 units of Botox injected at each site:   -Procerus muscle, midline injection  -Corrugator muscle, bilateral injection  -Frontalis muscle, bilateral injection, with 2 sites each side, medial injection was performed in the upper one third of the frontalis muscle, in the region vertical from the medial inferior edge of the superior orbital rim. The lateral injection was again in the upper one third of the forehead vertically above the lateral limbus of the cornea, 1.5 cm lateral to the medial injection site.  -Temporalis muscle injection, 4 sites, bilaterally. The first injection was 3 cm above the tragus of the ear, second injection site was 1.5 cm to 3 cm up  from the first injection site in line with the tragus of the ear. The third injection site was 1.5-3 cm forward between the first 2 injection sites. The fourth injection site was 1.5 cm posterior to the second injection site.  -Occipitalis muscle injection, 3 sites, bilaterally. The first injection was done one half way between the occipital protuberance and the tip of the mastoid process behind the ear. The second injection site was done lateral and superior to the first, 1 fingerbreadth from the first injection. The third injection site was 1 fingerbreadth superiorly and medially from the first injection site.  -Cervical paraspinal muscle injection, 2 sites, bilateral knee first injection site was 1 cm from the midline of the cervical spine, 3 cm inferior to the lower border of the occipital protuberance. The second injection site was 1.5 cm superiorly and laterally to the first injection site.  -Trapezius muscle injection was performed at 4 sites, bilaterally. The first injection site was in the upper trapezius muscle halfway between the inflection point of the neck, and the acromion. The second injection site was one half way between the acromion and the first injection site. The third injection was done between the first injection site and the inflection point of the neck. Fourth injection made beyond the first injection    Will return for repeat injection in 3 months.   A 200 units of Botox was used, 165 units were injected, the rest of the Botox was wasted. The patient tolerated the procedure well, there were no complications of the above procedure.  Ward Givens, MSN, NP-C 06/19/2022, 10:08 AM [facility] [address] [place],  [place] [address] [phone]

## 2022-06-19 NOTE — Progress Notes (Signed)
Botox- 100 units x 2 vials Lot: [id number]  Expiration: 08/2024 NDC: 0023-1145-01  Bacteriostatic 0.9% Sodium Chloride- 4mL total Lot: [id number] Expiration: 02/10/2023 NDC: 0409-1966-02  Dx: G43.711 S/P 

## 2022-06-20 DIAGNOSIS — Z1212 Encounter for screening for malignant neoplasm of rectum: Secondary | ICD-10-CM | POA: Diagnosis not present

## 2022-06-21 DIAGNOSIS — G43711 Chronic migraine without aura, intractable, with status migrainosus: Secondary | ICD-10-CM | POA: Diagnosis not present

## 2022-06-21 DIAGNOSIS — Z1339 Encounter for screening examination for other mental health and behavioral disorders: Secondary | ICD-10-CM | POA: Diagnosis not present

## 2022-06-21 DIAGNOSIS — R1313 Dysphagia, pharyngeal phase: Secondary | ICD-10-CM | POA: Diagnosis not present

## 2022-06-21 DIAGNOSIS — R82998 Other abnormal findings in urine: Secondary | ICD-10-CM | POA: Diagnosis not present

## 2022-06-21 DIAGNOSIS — F418 Other specified anxiety disorders: Secondary | ICD-10-CM | POA: Diagnosis not present

## 2022-06-21 DIAGNOSIS — G4700 Insomnia, unspecified: Secondary | ICD-10-CM | POA: Diagnosis not present

## 2022-06-21 DIAGNOSIS — K219 Gastro-esophageal reflux disease without esophagitis: Secondary | ICD-10-CM | POA: Diagnosis not present

## 2022-06-21 DIAGNOSIS — Z1331 Encounter for screening for depression: Secondary | ICD-10-CM | POA: Diagnosis not present

## 2022-06-21 DIAGNOSIS — Z0000 Encounter for general adult medical examination without abnormal findings: Secondary | ICD-10-CM | POA: Diagnosis not present

## 2022-06-21 DIAGNOSIS — D869 Sarcoidosis, unspecified: Secondary | ICD-10-CM | POA: Diagnosis not present

## 2022-06-21 DIAGNOSIS — E7800 Pure hypercholesterolemia, unspecified: Secondary | ICD-10-CM | POA: Diagnosis not present

## 2022-06-21 DIAGNOSIS — M542 Cervicalgia: Secondary | ICD-10-CM | POA: Diagnosis not present

## 2022-06-26 ENCOUNTER — Ambulatory Visit: Payer: Medicare Other | Admitting: Neurology

## 2022-08-09 NOTE — Telephone Encounter (Signed)
Pt scheduled for botox injection for 09/18/22 and will need a new auth with [employer] before appointment

## 2022-08-10 ENCOUNTER — Other Ambulatory Visit (HOSPITAL_COMMUNITY): Payer: Self-pay

## 2022-08-10 NOTE — Telephone Encounter (Signed)
Benefit Verification [id number] Submitted!

## 2022-08-10 NOTE — Telephone Encounter (Signed)
Pharmacy Patient Advocate Encounter   Received notification from [place] that prior authorization for Botox 200UNIT solution is required/requested.   PA submitted on 08/10/2022 to (ins) [employer] via [place] or (Medicaid) confirmation # [id number] Status is pending

## 2022-08-13 ENCOUNTER — Other Ambulatory Visit (HOSPITAL_COMMUNITY): Payer: Self-pay

## 2022-08-13 NOTE — Telephone Encounter (Signed)
Received fax from [facility] stating Botox [id number] has been approved from 08/10/22 - 06/11/23. [place] number [id number].   Approval letter faxed to Rx PA team. Received a receipt of confirmation.

## 2022-08-14 ENCOUNTER — Other Ambulatory Visit (HOSPITAL_COMMUNITY): Payer: Self-pay

## 2022-08-30 NOTE — Telephone Encounter (Signed)
Should this be B/B or SP?

## 2022-09-18 ENCOUNTER — Ambulatory Visit: Payer: Medicare HMO | Admitting: Neurology

## 2022-09-18 DIAGNOSIS — M542 Cervicalgia: Secondary | ICD-10-CM

## 2022-09-18 DIAGNOSIS — G43711 Chronic migraine without aura, intractable, with status migrainosus: Secondary | ICD-10-CM | POA: Diagnosis not present

## 2022-09-18 MED ORDER — ONABOTULINUMTOXINA 200 UNITS IJ SOLR
155.0000 [IU] | Freq: Once | INTRAMUSCULAR | Status: AC
  Administered 2022-09-18: 155 [IU] via INTRAMUSCULAR

## 2022-09-18 NOTE — Progress Notes (Signed)
Botox- 200 units x 1 vial Lot: [id number] Expiration: 11/2024 NDC: 0023-3921-02  Bacteriostatic 0.9% Sodium Chloride- 4 mL total Lot:[id number] Expiration: 04/2024 NDC: 63323-924-03  Dx: G43.711 B/B Witnessed by Sandy,RN  

## 2022-09-18 NOTE — Progress Notes (Addendum)
09/18/2022: still doing well > 70% improvement in migraine freq and severity and headache as well. Tightness in neck, tried muscle relaxers, conservative measures, dryneedling: send to dr Katrinka Blazing and see if he can help.  Orders Placed This Encounter  Procedures   AMB referral to sports medicine     Been seeing Butch Penny 02/06/2022: Stable, doing well on botox, daughter just brought the support dog to school 10 pounds, cocker spaniel and a poodle, she sent me info on bigger dogs but they decided on a smaller dog 11/14/2021: She is doing well, stable > 70% improvement in migraines and headache frequency and severity. They bougt her daughter an emotional support dog.  08/22/2021: Stable 05/23/2021:  She reconciled with her husband.Her one daughter is in [place] and Social research officer, government and had a hard time first year of college but is ok now.she just got a dog, a doodle blend. Her daughter. Patient Doing spectacular on botox, > 60% reduced in migraine freq and severity   Consent Form Botulism Toxin Injection For Chronic Migraine    Reviewed orally with patient, additionally signature is on file:  Botulism toxin has been approved by the Federal drug administration for treatment of chronic migraine. Botulism toxin does not cure chronic migraine and it may not be effective in some patients.  The administration of botulism toxin is accomplished by injecting a small amount of toxin into the muscles of the neck and head. Dosage must be titrated for each individual. Any benefits resulting from botulism toxin tend to wear off after 3 months with a repeat injection required if benefit is to be maintained. Injections are usually done every 3-4 months with maximum effect peak achieved by about 2 or 3 weeks. Botulism toxin is expensive and you should be sure of what costs you will incur resulting from the injection.  The side effects of botulism toxin use for chronic migraine may include:   -Transient, and  usually mild, facial weakness with facial injections  -Transient, and usually mild, head or neck weakness with head/neck injections  -Reduction or loss of forehead facial animation due to forehead muscle weakness  -Eyelid drooping  -Dry eye  -Pain at the site of injection or bruising at the site of injection  -Double vision  -Potential unknown long term risks  Contraindications: You should not have Botox if you are pregnant, nursing, allergic to albumin, have an infection, skin condition, or muscle weakness at the site of the injection, or have myasthenia gravis, Lambert-Eaton syndrome, or ALS.  It is also possible that as with any injection, there may be an allergic reaction or no effect from the medication. Reduced effectiveness after repeated injections is sometimes seen and rarely infection at the injection site may occur. All care will be taken to prevent these side effects. If therapy is given over a long time, atrophy and wasting in the muscle injected may occur. Occasionally the patient's become refractory to treatment because they develop antibodies to the toxin. In this event, therapy needs to be modified.  I have read the above information and consent to the administration of botulism toxin.    BOTOX PROCEDURE NOTE FOR MIGRAINE HEADACHE    Contraindications and precautions discussed with patient(above). Aseptic procedure was observed and patient tolerated procedure. Procedure performed by Dr. Artemio Aly  The condition has existed for more than 6 months, and pt does not have a diagnosis of ALS, Myasthenia Gravis or Lambert-Eaton Syndrome.  Risks and benefits of injections discussed and pt agrees  to proceed with the procedure.  Written consent obtained  These injections are medically necessary. Pt  receives good benefits from these injections. These injections do not cause sedations or hallucinations which the oral therapies may cause.  Description of procedure:  The patient was  placed in a sitting position. The standard protocol was used for Botox as follows, with 5 units of Botox injected at each site:   -Procerus muscle, midline injection  -Corrugator muscle, bilateral injection  -Frontalis muscle, bilateral injection, with 2 sites each side, medial injection was performed in the upper one third of the frontalis muscle, in the region vertical from the medial inferior edge of the superior orbital rim. The lateral injection was again in the upper one third of the forehead vertically above the lateral limbus of the cornea, 1.5 cm lateral to the medial injection site.  -Temporalis muscle injection, 4 sites, bilaterally. The first injection was 3 cm above the tragus of the ear, second injection site was 1.5 cm to 3 cm up from the first injection site in line with the tragus of the ear. The third injection site was 1.5-3 cm forward between the first 2 injection sites. The fourth injection site was 1.5 cm posterior to the second injection site.   -Occipitalis muscle injection, 3 sites, bilaterally. The first injection was done one half way between the occipital protuberance and the tip of the mastoid process behind the ear. The second injection site was done lateral and superior to the first, 1 fingerbreadth from the first injection. The third injection site was 1 fingerbreadth superiorly and medially from the first injection site.  -Cervical paraspinal muscle injection, 2 sites, bilateral knee first injection site was 1 cm from the midline of the cervical spine, 3 cm inferior to the lower border of the occipital protuberance. The second injection site was 1.5 cm superiorly and laterally to the first injection site.  -Trapezius muscle injection was performed at 3 sites, bilaterally. The first injection site was in the upper trapezius muscle halfway between the inflection point of the neck, and the acromion. The second injection site was one half way between the acromion and the  first injection site. The third injection was done between the first injection site and the inflection point of the neck.   Will return for repeat injection in 3 months.   155 units of Botox was used, any of the 45U Botox not used were injected was wasted. The patient tolerated the procedure well, there were no complications of the above procedure.

## 2022-09-18 NOTE — Addendum Note (Signed)
Addended by: Naomie Dean B on: 09/18/2022 02:00 PM   Modules accepted: Orders

## 2022-09-20 ENCOUNTER — Ambulatory Visit: Payer: Medicare HMO | Admitting: Adult Health

## 2022-10-11 NOTE — Progress Notes (Signed)
[name] [address] [place] [address] Phone: [phone] Subjective:   [name], am serving as a scribe for Dr. Antoine Primas.  I'm seeing this patient by the request  of:  Tisovec, Adelfa Koh, MD  CC: Chronic neck pain  [id number]  [name] [name] is a 57 y.o. female coming in with complaint of chronic neck pain. Patient states that she has migraines and gets botox injections. Injections are helpful. Tried dry needling which was helpful depending on the therapist. Pain occurring daily. Denies any radicular symptoms into UE.      Past Medical History:  Diagnosis Date   Anxiety    Brain fog    Chronic pain    COMPLEX REGIONAL PAIN SYNDROME   Hemorrhoids    History of neck problems    dry needling being done & PT   Leg cramping 2020   right, says sometimes she has a hard time putting pressure on that leg    Migraine headache    Sarcoidosis    Past Surgical History:  Procedure Laterality Date   CHOLECYSTECTOMY     DILATION AND CURETTAGE OF UTERUS     ESOPHAGEAL MANOMETRY N/A 05/05/2018   Procedure: ESOPHAGEAL MANOMETRY (EM);  Surgeon: Vida Rigger, MD;  Location: [facility];  Service: Endoscopy;  Laterality: N/A;   SHOULDER ARTHROSCOPY Left 2008   THORACOSCOPY     TONSILLECTOMY AND ADENOIDECTOMY     AT AGE 27   WISDOM TOOTH EXTRACTION     Social History   Socioeconomic History   Marital status: Married    Spouse name: Not on file   Number of children: Not on file   Years of education: Not on file   Highest education level: Master's degree (e.g., MA, MS, MEng, MEd, MSW, MBA)  Occupational History   Not on file  Tobacco Use   Smoking status: Never   Smokeless tobacco: Never  Vaping Use   Vaping Use: Never used  Substance and Sexual Activity   Alcohol use: Not Currently    Alcohol/week: 1.0 standard drink of alcohol    Types: 1 Standard drinks or equivalent per week   Drug use: No   Sexual activity: Yes     Partners: Male    Birth control/protection: Pill  Other Topics Concern   Not on file  Social History Narrative   Lives at home with husband and daughter   Right handed   Caffeine: 1 cup/day   Social Determinants of Health   Financial Resource Strain: Not on file  Food Insecurity: Not on file  Transportation Needs: Not on file  Physical Activity: Not on file  Stress: Not on file  Social Connections: Not on file   Allergies  Allergen Reactions   Levaquin [Levofloxacin In D5w]     Severe rash   Metoclopramide Hcl    Penicillins     GI upset   Reglan [Metoclopramide] Anxiety   Family History  Problem Relation Age of Onset   Diabetes Mother        TYPE 2   Hypertension Mother    Cancer Mother        UTERINE CANCER   Heart disease Father        HEART ATTACK   Migraines Father     Current Outpatient Medications (Endocrine & Metabolic):    progesterone (PROMETRIUM) 200 MG capsule, Take 200 mg by mouth daily.   Current Outpatient Medications (Respiratory):    Cetirizine HCl (  ZYRTEC PO), Take 10 mg by mouth daily as needed.   fluticasone (FLONASE) 50 MCG/ACT nasal spray, Place 2 sprays into both nostrils as needed.  Current Outpatient Medications (Analgesics):    HYDROcodone-acetaminophen (NORCO) 5-325 MG tablet, Take 1-2 tablets by mouth every 6 (six) hours as needed for moderate pain.   rizatriptan (MAXALT-MLT) 10 MG disintegrating tablet, Take 1 tablet (10 mg total) by mouth as needed for migraine. May repeat in 2 hours if needed   Current Outpatient Medications (Other):    ALPRAZolam (XANAX) 0.5 MG tablet, Take 0.5 mg by mouth at bedtime as needed for anxiety.   Amphetamine ER (ADZENYS XR-ODT) 18.8 MG TBED, Take 1 tablet by mouth daily.    botulinum toxin Type A (BOTOX) 100 units SOLR injection, Provider to inject 155 units into the muscles of the head and neck every 3 months. Discard remainder.   Cholecalciferol (VITAMIN D3 PO), Take 5,000 Int'l Units/L by mouth  daily. Patient state   PARoxetine (PAXIL) 20 MG tablet, Take 20 mg by mouth daily.   TRAZODONE HCL PO, Take 100 mg by mouth at bedtime.   UNABLE TO FIND, Biotepellet  inserted every 3 months for hormone replacement   tiZANidine (ZANAFLEX) 4 MG tablet, Take 1 tablet (4 mg total) by mouth every 6 (six) hours as needed for muscle spasms.   Reviewed prior external information including notes and imaging from  primary care provider As well as notes that were available from care everywhere and other healthcare systems.  Past medical history, social, surgical and family history all reviewed in electronic medical record.  No pertanent information unless stated regarding to the chief complaint.  Reviewed patient's MRIs from 2020 showing no abnormality of the cervical or brain area  Review of Systems:  No headache, visual changes, nausea, vomiting, diarrhea, constipation, dizziness, abdominal pain, skin rash, fevers, chills, night sweats, weight loss, swollen lymph nodes,   chest pain, shortness of breath, mood changes. POSITIVE muscle aches, body aches, joint swelling  Objective  Blood pressure 118/72, height 5\' 1"  (1.549 m), weight 99 lb (44.9 kg), last menstrual period 01/05/2011.   General: No apparent distress alert and oriented x3 mood and affect normal, dressed appropriately.  HEENT: Pupils equal, extraocular movements intact  Respiratory: Patient's speak in full sentences and does not appear short of breath  Cardiovascular: No lower extremity edema, non tender, no erythema  Neck exam does have some mild loss of lordosis noted.  Significant as well.  Sidebending bilaterally.   [id number]; 15 additional minutes spent for Therapeutic exercises as stated in above notes.  This included exercises focusing on stretching, strengthening, with significant focus on eccentric aspects.   Long term goals include an improvement in range of motion, strength, endurance as well as avoiding reinjury. Patient's  frequency would include in 1-2 times a day, 3-5 times a week for a duration of 6-12 weeks. Exercises that included:  Basic scapular stabilization to include adduction and depression of scapula Scaption, focusing on proper movement and good control Internal and External rotation utilizing a theraband, with elbow tucked at side entire time Rows with theraband   Proper technique shown and discussed handout in great detail with ATC.  All questions were discussed and answered.     Impression and Recommendations:     The above documentation has been reviewed and is accurate and complete Judi Saa, DO

## 2022-10-12 ENCOUNTER — Ambulatory Visit: Payer: Medicare HMO | Admitting: Family Medicine

## 2022-10-12 VITALS — BP 118/72 | Ht 61.0 in | Wt 99.0 lb

## 2022-10-12 DIAGNOSIS — M9908 Segmental and somatic dysfunction of rib cage: Secondary | ICD-10-CM

## 2022-10-12 DIAGNOSIS — M9901 Segmental and somatic dysfunction of cervical region: Secondary | ICD-10-CM

## 2022-10-12 DIAGNOSIS — G43711 Chronic migraine without aura, intractable, with status migrainosus: Secondary | ICD-10-CM | POA: Diagnosis not present

## 2022-10-12 DIAGNOSIS — M9902 Segmental and somatic dysfunction of thoracic region: Secondary | ICD-10-CM | POA: Diagnosis not present

## 2022-10-12 DIAGNOSIS — M9903 Segmental and somatic dysfunction of lumbar region: Secondary | ICD-10-CM

## 2022-10-12 DIAGNOSIS — M9904 Segmental and somatic dysfunction of sacral region: Secondary | ICD-10-CM

## 2022-10-12 DIAGNOSIS — M6289 Other specified disorders of muscle: Secondary | ICD-10-CM | POA: Diagnosis not present

## 2022-10-12 DIAGNOSIS — M2550 Pain in unspecified joint: Secondary | ICD-10-CM | POA: Diagnosis not present

## 2022-10-12 LAB — CBC WITH DIFFERENTIAL/PLATELET
Basophils Absolute: 0 10*3/uL (ref 0.0–0.1)
Basophils Relative: 0.6 % (ref 0.0–3.0)
Eosinophils Absolute: 0.2 10*3/uL (ref 0.0–0.7)
Eosinophils Relative: 3.8 % (ref 0.0–5.0)
HCT: 42.9 % (ref 36.0–46.0)
Hemoglobin: 14.2 g/dL (ref 12.0–15.0)
Lymphocytes Relative: 20.6 % (ref 12.0–46.0)
Lymphs Abs: 1.2 10*3/uL (ref 0.7–4.0)
MCHC: 33.1 g/dL (ref 30.0–36.0)
MCV: 89.9 fl (ref 78.0–100.0)
Monocytes Absolute: 0.5 10*3/uL (ref 0.1–1.0)
Monocytes Relative: 8 % (ref 3.0–12.0)
Neutro Abs: 4 10*3/uL (ref 1.4–7.7)
Neutrophils Relative %: 67 % (ref 43.0–77.0)
Platelets: 264 10*3/uL (ref 150.0–400.0)
RBC: 4.78 Mil/uL (ref 3.87–5.11)
RDW: 13.3 % (ref 11.5–15.5)
WBC: 6 10*3/uL (ref 4.0–10.5)

## 2022-10-12 LAB — COMPREHENSIVE METABOLIC PANEL
ALT: 20 U/L (ref 0–35)
AST: 22 U/L (ref 0–37)
Albumin: 4.2 g/dL (ref 3.5–5.2)
Alkaline Phosphatase: 35 U/L — ABNORMAL LOW (ref 39–117)
BUN: 13 mg/dL (ref 6–23)
CO2: 32 mEq/L (ref 19–32)
Calcium: 10 mg/dL (ref 8.4–10.5)
Chloride: 100 mEq/L (ref 96–112)
Creatinine, Ser: 0.71 mg/dL (ref 0.40–1.20)
GFR: 95.06 mL/min (ref >60.00)
Glucose, Bld: 95 mg/dL (ref 70–99)
Potassium: 5.2 mEq/L — ABNORMAL HIGH (ref 3.5–5.1)
Sodium: 138 mEq/L (ref 135–145)
Total Bilirubin: 0.4 mg/dL (ref 0.2–1.2)
Total Protein: 6.9 g/dL (ref 6.0–8.3)

## 2022-10-12 LAB — FERRITIN: Ferritin: 11 ng/mL (ref 10.0–291.0)

## 2022-10-12 LAB — IBC PANEL
Iron: 55 ug/dL (ref 42–145)
Saturation Ratios: 15.2 % — ABNORMAL LOW (ref 20.0–50.0)
TIBC: 362.6 ug/dL (ref 250.0–450.0)
Transferrin: 259 mg/dL (ref 212.0–360.0)

## 2022-10-12 LAB — C-REACTIVE PROTEIN: CRP: <1 mg/dL (ref 0.5–20.0)

## 2022-10-12 LAB — VITAMIN B12: Vitamin B-12: 688 pg/mL (ref 211–911)

## 2022-10-12 LAB — TSH: TSH: 0.68 u[IU]/mL (ref 0.35–5.50)

## 2022-10-12 LAB — VITAMIN D 25 HYDROXY (VIT D DEFICIENCY, FRACTURES): VITD: 51.96 ng/mL (ref 30.00–100.00)

## 2022-10-12 LAB — URIC ACID: Uric Acid, Serum: 3.8 mg/dL (ref 2.4–7.0)

## 2022-10-12 LAB — SEDIMENTATION RATE: Sed Rate: 19 mm/hr (ref 0–30)

## 2022-10-12 NOTE — Assessment & Plan Note (Signed)
Patient is having cervicogenic headaches as well as intermittent migraine.  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  Concerned that there could be other laboratory workup that would be beneficial.  Will get x-ray change.  Make sure patient is not having any type of flare otherwise.  Patient does seem to be underweight and states that she has lost weight recently.  Given exercises by her athletic trainer today.  Could do well with formal physical therapy if needed.  Will consider the possibility of osteopathic manipulation at follow-up follow-up again in 6 to 8 weeks

## 2022-10-12 NOTE — Assessment & Plan Note (Signed)
Patient has had difficulty with her pelvic floor and appears with reviewing.  Has had some anal fissures as well.  Will refer patient to pelvic floor physical therapy which I think could be beneficial.

## 2022-10-12 NOTE — Patient Instructions (Signed)
Exercises Labs today PT for pelvic floor  No other real changes See me in 6 weeks

## 2022-10-15 LAB — ANGIOTENSIN CONVERTING ENZYME: Angiotensin-Converting Enzyme: 20 U/L (ref 9–67)

## 2022-10-15 LAB — PTH, INTACT AND CALCIUM
Calcium: 9.8 mg/dL (ref 8.6–10.4)
PTH: 26 pg/mL (ref 16–77)

## 2022-10-15 LAB — ANTI-NUCLEAR AB-TITER (ANA TITER): ANA Titer 1: 1:40 {titer} — ABNORMAL HIGH

## 2022-10-15 LAB — RHEUMATOID FACTOR: Rheumatoid fact SerPl-aCnc: <10 IU/mL (ref <14)

## 2022-10-15 LAB — CYCLIC CITRUL PEPTIDE ANTIBODY, IGG: Cyclic Citrullin Peptide Ab: <16 UNITS

## 2022-10-15 LAB — ANA: Anti Nuclear Antibody (ANA): POSITIVE — AB

## 2022-10-15 LAB — CALCIUM, IONIZED: Calcium, Ion: 5.2 mg/dL (ref 4.7–5.5)

## 2022-10-16 ENCOUNTER — Encounter: Payer: Self-pay | Admitting: Family Medicine

## 2022-10-16 ENCOUNTER — Other Ambulatory Visit: Payer: Self-pay

## 2022-10-16 DIAGNOSIS — R079 Chest pain, unspecified: Secondary | ICD-10-CM

## 2022-10-16 NOTE — Therapy (Unsigned)
OUTPATIENT PHYSICAL THERAPY FEMALE PELVIC EVALUATION   Patient Name: [name] [name] MRN: [id number] DOB:12-Jan-1966, 57 y.o., female Today's Date: 10/17/2022  END OF SESSION:  PT End of Session - 10/17/22 1020     Visit Number 1    Date for PT Re-Evaluation 01/09/23    Authorization Type [employer]    Authorization - Visit Number 1    Authorization - Number of Visits 10    PT Start Time 1015    PT Stop Time 1050    PT Time Calculation (min) 35 min    Activity Tolerance Patient tolerated treatment well    Behavior During Therapy WFL for tasks assessed/performed             Past Medical History:  Diagnosis Date   Anxiety    Brain fog    Chronic pain    COMPLEX REGIONAL PAIN SYNDROME   Hemorrhoids    History of neck problems    dry needling being done & PT   Leg cramping 2020   right, says sometimes she has a hard time putting pressure on that leg    Migraine headache    Sarcoidosis    Past Surgical History:  Procedure Laterality Date   CHOLECYSTECTOMY     DILATION AND CURETTAGE OF UTERUS     ESOPHAGEAL MANOMETRY N/A 05/05/2018   Procedure: ESOPHAGEAL MANOMETRY (EM);  Surgeon: Vida Rigger, MD;  Location: [facility];  Service: Endoscopy;  Laterality: N/A;   SHOULDER ARTHROSCOPY Left 2008   THORACOSCOPY     TONSILLECTOMY AND ADENOIDECTOMY     AT AGE 72   WISDOM TOOTH EXTRACTION     Patient Active Problem List   Diagnosis Date Noted   Pelvic floor dysfunction 10/12/2022   Nasal congestion 06/16/2019   Post-nasal drainage 06/16/2019   Chronic migraine without aura, with intractable migraine, so stated, with status migrainosus 04/30/2019   Globus pharyngeus 04/14/2018   Laryngopharyngeal reflux (LPR) 10/30/2017   Lung nodule 10/30/2017   ANA positive 06/21/2014   Arthralgia 03/22/2014   Fatigue 03/22/2014   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   Radial tunnel syndrome 03/22/2014   Right foot pain 11/04/2012   Migraine with aura 02/06/2011   CONSTIPATION, CHRONIC  07/11/2007   DYSPHAGIA, PHARYNGOESOPHAGEAL PHASE 07/11/2007   NAUSEA WITH VOMITING 06/13/2007   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 I have a fissure. She has to take 2 Alleve per day. Fissue not healed and opens every time she has a bowel movements.  Fluid intake: Yes: water    PAIN:  Are you having pain? Yes NPRS scale: 8-10/10 Pain location:  rectal  Pain type: burning, sharp, and stabbing Pain description: intermittent   Aggravating factors: bowel movements Relieving factors: no bowel movements  PRECAUTIONS: None  WEIGHT BEARING RESTRICTIONS: No  FALLS:  Has patient fallen in last 6 months? No  LIVING ENVIRONMENT: Lives with: lives with their spouse   OCCUPATION: retired  PLOF: Independent  PATIENT GOALS: reduce pain and fissure management  PERTINENT HISTORY:  Sarcoidosis; Cholecystectomy; Complex regional pain syndrome   BOWEL MOVEMENT: Pain with bowel movement: Yes Type of bowel movement:Type (Bristol Stool Scale) Type 1, 4, Frequency 1-2 days, Strain Yes, and Splinting no Fully empty rectum: No Leakage: No Pads: No Fiber supplement: No  URINATION: Pain with urination: No Fully empty bladder: No, after urination she will hold her buttocks to get the rest of the urine out when she has a bowel movement Stream:   average Urgency: Yes: sometimes Frequency: during day 4 times; night 1 time Leakage:  none Pads: No  INTERCOURSE: Pain with intercourse:  none Ability to have vaginal penetration:  Yes:   Climax: yes Marinoff Scale: 0/3  PREGNANCY: Vaginal deliveries 1 Tearing Yes: had some, episiotomy   PROLAPSE: None   OBJECTIVE:   DIAGNOSTIC FINDINGS:  none  PATIENT SURVEYS:  [id number]  COGNITION: Overall cognitive status: Within functional limits for tasks assessed     SENSATION: Light touch: Appears intact Proprioception: Appears intact   POSTURE: No Significant postural limitations  PELVIC ALIGNMENT:  LUMBARAROM/PROM:  A/PROM A/PROM  eval  Flexion Decreased by 25%  Extension Decreased by 25%  Right lateral flexion full  Left lateral flexion full  Right rotation full  Left rotation full   (Blank rows = not tested)  LOWER EXTREMITY ROM: bilateral hip ROM is full   LOWER EXTREMITY MMT:  MMT Right eval Left eval  Hip flexion 3+/5 5/5  Hip extension 4/5 5/5  Hip abduction 3/5 4/5  Hip adduction 4/5 5/5   PALPATION:   General  lower abdominals will bulge with contraction                 External Perineal Exam tenderness located on the right external anal sphincter, along the perineal body, pelvic floor contraction she will engage the gluteal, rectum is cauliflower shape.                              Internal Pelvic Floor not assessed  Patient confirms identification and approves PT to assess internal pelvic floor and treatment Yes  PELVIC MMT:   MMT eval  Vaginal   Internal Anal Sphincter   External Anal Sphincter   Puborectalis   Diastasis Recti 1 finger width above and below umbilicus  (Blank rows = not tested)        TONE: Unable to assess due to patient having open areas on her hemorrhoids and the rectum was very red. Patient had pain with the attempt to place the pinky finger into the canal so therapist did not try. She placed [employer] onto the rectum.   PROLAPSE: none  TODAY'S TREATMENT:  DATE: 10/17/22  EVAL eval finished   PATIENT EDUCATION:  Education details: pelvic floor mediation, gave patient sample of [employer] Person educated: Patient Education method:  you tube Education comprehension: verbalized understanding  HOME EXERCISE PROGRAM: See above.  ASSESSMENT:  CLINICAL IMPRESSION: Patient is a 57 y.o. female who was seen today for physical therapy evaluation and treatment for pelvic floor dysfunction. Patient has had difficulty with anal fissures for several years.  Patient will have 8-10/10 pain with bowel movements and not able to do things that day. Patient is scared to have a bowel movement due to the pain. She has a cauliflower shape with many folds of the rectum. She has an open sore on one of the hemorrhoids and the rectum is very red. Patient has tenderness located on the right side of the external anal sphincter and perineal body. She has tightness in the diaphragm and difficulty with opening the lower rib cage. Patient does not contract the lower abdomen correctly. She has weakness in right hip. Patient will benefit from skilled therapy to improve pelvic floor relaxation to have a bowel movement and understand fissure care.   OBJECTIVE IMPAIRMENTS: decreased activity tolerance, decreased coordination, decreased endurance, decreased strength, increased fascial restrictions, increased muscle spasms, and pain.   ACTIVITY LIMITATIONS: toileting  PARTICIPATION LIMITATIONS: meal prep, cleaning, laundry, driving, shopping, and community activity  PERSONAL FACTORS: Time since onset of injury/illness/exacerbation and 1-2 comorbidities: Sarcoidosis; Cholecystectomy; Complex regional pain syndrome  are also affecting patient's functional outcome.   REHAB POTENTIAL:  Excellent  CLINICAL DECISION MAKING: Evolving/moderate complexity  EVALUATION COMPLEXITY: Moderate   GOALS: Goals reviewed with patient? Yes  SHORT TERM GOALS: Target date: 11/12/22  Patient independent with fissure care.  Baseline: Goal status: INITIAL  2.  Patient educated on vaginal moisturizers to improve pelvic floor health.  Baseline:  Goal status: INITIAL  3.  Patient able to perform diaphragmatic breathing to relax her pelvic floor for toileting.  Baseline:  Goal status: INITIAL  4.  Patient understands how to massage around the anus prior to bowel movement.  Baseline:  Goal status: INITIAL   LONG TERM GOALS: Target date: 01/09/23  Patient independent with advanced HEP for core and hip strength.  Baseline:  Goal status: INITIAL  2.  Patient is able to bulge her pelvic floor for correct toileting to assist with decreased strain on her fissure.  Baseline:  Goal status: INITIAL  3.  Patient is able to have a bowel movement with pain decreased >/= 75% due to the ability to relax her pelvic floor.  Baseline:  Goal status: INITIAL  4.  Patient is able to contract her lower abdomen instead of bulge it to correctly push the stool out.  Baseline:  Goal status: INITIAL  5.  CRAIG-7 score is </= 20 compared to 95  Baseline:  Goal status: INITIAL    PLAN:  PT FREQUENCY: 1x/week  PT DURATION: 12 weeks  PLANNED INTERVENTIONS: Therapeutic exercises, Therapeutic activity, Neuromuscular re-education, Patient/Family education, Joint mobilization, Dry Needling, Electrical stimulation, Spinal mobilization, Cryotherapy, Moist heat, Ultrasound, Biofeedback, and Manual therapy  PLAN FOR NEXT SESSION: rib mobility, diaphragmatic breathing, massage around the rectum, hip stretches for the pelvic floor   Eulis Foster, PT 10/17/22 11:11 AM

## 2022-10-17 ENCOUNTER — Ambulatory Visit (INDEPENDENT_AMBULATORY_CARE_PROVIDER_SITE_OTHER): Payer: Medicare HMO

## 2022-10-17 ENCOUNTER — Ambulatory Visit: Payer: Medicare HMO | Attending: Family Medicine | Admitting: Physical Therapy

## 2022-10-17 ENCOUNTER — Other Ambulatory Visit: Payer: Self-pay

## 2022-10-17 ENCOUNTER — Encounter: Payer: Self-pay | Admitting: Physical Therapy

## 2022-10-17 DIAGNOSIS — M6289 Other specified disorders of muscle: Secondary | ICD-10-CM | POA: Diagnosis not present

## 2022-10-17 DIAGNOSIS — R29898 Other symptoms and signs involving the musculoskeletal system: Secondary | ICD-10-CM | POA: Insufficient documentation

## 2022-10-17 DIAGNOSIS — K6289 Other specified diseases of anus and rectum: Secondary | ICD-10-CM | POA: Diagnosis not present

## 2022-10-17 DIAGNOSIS — R079 Chest pain, unspecified: Secondary | ICD-10-CM | POA: Diagnosis not present

## 2022-10-17 DIAGNOSIS — R252 Cramp and spasm: Secondary | ICD-10-CM | POA: Diagnosis not present

## 2022-10-17 DIAGNOSIS — R278 Other lack of coordination: Secondary | ICD-10-CM | POA: Diagnosis not present

## 2022-10-24 ENCOUNTER — Encounter: Payer: Self-pay | Admitting: Physical Therapy

## 2022-10-24 ENCOUNTER — Ambulatory Visit: Payer: Medicare HMO | Admitting: Physical Therapy

## 2022-10-24 DIAGNOSIS — R278 Other lack of coordination: Secondary | ICD-10-CM | POA: Diagnosis not present

## 2022-10-24 DIAGNOSIS — R29898 Other symptoms and signs involving the musculoskeletal system: Secondary | ICD-10-CM | POA: Diagnosis not present

## 2022-10-24 DIAGNOSIS — K6289 Other specified diseases of anus and rectum: Secondary | ICD-10-CM | POA: Diagnosis not present

## 2022-10-24 DIAGNOSIS — R252 Cramp and spasm: Secondary | ICD-10-CM

## 2022-10-24 DIAGNOSIS — M6289 Other specified disorders of muscle: Secondary | ICD-10-CM | POA: Diagnosis not present

## 2022-10-24 NOTE — Therapy (Signed)
OUTPATIENT PHYSICAL THERAPY FEMALE PELVIC TREATMENT   Patient Name: [name] [name] MRN: [id number] DOB:04-02-1966, 57 y.o., female Today's Date: 10/24/2022  END OF SESSION:  PT End of Session - 10/24/22 1531     Visit Number 2    Date for PT Re-Evaluation 01/09/23    Authorization Type [employer]    Authorization Time Period 10/17/2022-01/09/2023    Authorization - Visit Number 2    Authorization - Number of Visits 12    Progress Note Due on Visit 10    PT Start Time 1530    PT Stop Time 1610    PT Time Calculation (min) 40 min    Activity Tolerance Patient tolerated treatment well    Behavior During Therapy WFL for tasks assessed/performed             Past Medical History:  Diagnosis Date   Anxiety    Brain fog    Chronic pain    COMPLEX REGIONAL PAIN SYNDROME   Hemorrhoids    History of neck problems    dry needling being done & PT   Leg cramping 2020   right, says sometimes she has a hard time putting pressure on that leg    Migraine headache    Sarcoidosis    Past Surgical History:  Procedure Laterality Date   CHOLECYSTECTOMY     DILATION AND CURETTAGE OF UTERUS     ESOPHAGEAL MANOMETRY N/A 05/05/2018   Procedure: ESOPHAGEAL MANOMETRY (EM);  Surgeon: Vida Rigger, MD;  Location: [facility];  Service: Endoscopy;  Laterality: N/A;   SHOULDER ARTHROSCOPY Left 2008   THORACOSCOPY     TONSILLECTOMY AND ADENOIDECTOMY     AT AGE 65   WISDOM TOOTH EXTRACTION     Patient Active Problem List   Diagnosis Date Noted   Pelvic floor dysfunction 10/12/2022   Nasal congestion 06/16/2019   Post-nasal drainage 06/16/2019   Chronic migraine without aura, with intractable migraine, so stated, with status migrainosus 04/30/2019   Globus pharyngeus 04/14/2018   Laryngopharyngeal reflux (LPR) 10/30/2017   Lung nodule 10/30/2017   ANA positive 06/21/2014   Arthralgia 03/22/2014   Fatigue 03/22/2014   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   Radial tunnel syndrome 03/22/2014    Right foot pain 11/04/2012   Migraine with aura 02/06/2011   CONSTIPATION, CHRONIC 07/11/2007   DYSPHAGIA, PHARYNGOESOPHAGEAL PHASE 07/11/2007   NAUSEA WITH VOMITING 06/13/2007   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   PAIN:  Are you having pain? Yes NPRS scale: 8-10/10 Pain location:  rectal  Pain type: burning, sharp, and stabbing Pain description: intermittent   Aggravating factors: bowel movements Relieving factors: no bowel movements  PRECAUTIONS: None  WEIGHT BEARING RESTRICTIONS: No  FALLS:  Has patient fallen in last 6 months? No  LIVING ENVIRONMENT: Lives with: lives with their spouse   OCCUPATION: retired  PLOF: Independent  PATIENT GOALS: reduce pain and fissure management  PERTINENT HISTORY:  Sarcoidosis; Cholecystectomy; Complex regional pain syndrome   BOWEL MOVEMENT: Pain with bowel movement: Yes Type of bowel movement:Type (Bristol Stool Scale) Type 1, 4, Frequency 1-2 days, Strain Yes, and Splinting no Fully empty rectum: No Leakage: No Pads: No Fiber supplement: No  URINATION: Pain with urination: No Fully empty bladder: No, after urination she will hold her buttocks to get the rest of the urine out when she has a bowel movement Stream:  average Urgency: Yes:  sometimes Frequency: during day 4 times; night 1 time Leakage:  none Pads: No  INTERCOURSE: Pain with intercourse:  none Ability to have vaginal penetration:  Yes:   Climax: yes Marinoff Scale: 0/3  PREGNANCY: Vaginal deliveries 1 Tearing Yes: had some, episiotomy   PROLAPSE: None   OBJECTIVE:   DIAGNOSTIC FINDINGS:  none  PATIENT SURVEYS:  CRAIQ-7 95  COGNITION: Overall cognitive status: Within functional limits for tasks assessed     SENSATION: Light touch: Appears intact Proprioception: Appears intact   POSTURE: No Significant postural limitations  PELVIC ALIGNMENT:  LUMBARAROM/PROM:  A/PROM A/PROM  eval  Flexion Decreased by 25%  Extension Decreased by 25%  Right lateral flexion full  Left lateral flexion full  Right rotation full  Left rotation full   (Blank rows = not tested)  LOWER EXTREMITY ROM: bilateral hip ROM is full   LOWER EXTREMITY MMT:  MMT Right eval Left eval  Hip flexion 3+/5 5/5  Hip extension 4/5 5/5  Hip abduction 3/5 4/5  Hip adduction 4/5 5/5   PALPATION:   General  lower abdominals will bulge with contraction                 External Perineal Exam tenderness located on the right external anal sphincter, along the perineal body, pelvic floor contraction she will engage the gluteal, rectum is cauliflower shape.                              Internal Pelvic Floor not assessed  Patient confirms identification and approves PT to assess internal pelvic floor and treatment Yes  PELVIC MMT:   MMT eval  Vaginal   Internal Anal Sphincter   External Anal Sphincter   Puborectalis   Diastasis Recti 1 finger width above and below umbilicus  (Blank rows = not tested)        TONE: Unable to assess due to patient having open areas on her hemorrhoids and the rectum was very red. Patient had pain with the attempt to place the pinky finger into the canal so therapist did not try. She placed [employer] onto the  rectum.   PROLAPSE: none  TODAY'S TREATMENT:   10/24/22 Manual: Trigger Point Dry-Needling  Treatment instructions: Expect mild to moderate muscle soreness. S/S of pneumothorax if dry needled over a lung field, and to seek immediate medical attention should they occur. Patient verbalized  understanding of these instructions and education.  Patient Consent Given: Yes Education handout provided: Yes Muscles treated: levator ani, external anal sphincter, perineal body, superior transverse Electrical stimulation performed: No Parameters: N/A Treatment response/outcome: elongation of muscle and trigger point response  Soft tissue mobilization: To assess for dry needling Manual work externally to levator ani, external anal sphincter, puborectalis and perineal body  Neuromuscular re-education: Pelvic floor training Using tactile cues on the outside of the rectum working on isolated contraction and relaxation of the pelvic floor  Down training: Diaphragmatic breathing in sitting with therapist giving cues to the lateral rib cage to expand and have patient feel the pelvic floor relax Therapeutic activities: Functional strengthening activities: Education on correct toilet method for bowel movements and not to strain with knees above the hips, diaphragmatic breathing, keeping her body relaxed and how to breath out to relax the pelvic floor and build pressure to push the stool out.                                                                                                                              DATE: 10/17/22  EVAL eval finished   PATIENT EDUCATION: 10/24/22 Education details: educated patient on diaphragmatic breathing and toileting, information on dry needling Person educated: Patient Education method: Explanation, Demonstration, Tactile cues, Verbal cues, and Handouts Education comprehension: verbalized understanding, returned demonstration, verbal cues required, tactile cues  required, and needs further education    HOME EXERCISE PROGRAM: See above.  ASSESSMENT:  CLINICAL IMPRESSION: Patient is a 57 y.o. female who was seen today for physical therapy  treatment for pelvic floor dysfunction. Patient anus went upward into the rectum after the dry needling due to the relaxation of trigger points. She was able to contract the anus and relax with good excursion. She was able to sit without feeling she was sitting on something after the manual work. Patient was educated on how to have a bowel movmeent with relaxation of the pelvic floor. Patient will benefit from skilled therapy to improve pelvic floor relaxation to have a bowel movement and understand fissure care.   OBJECTIVE IMPAIRMENTS: decreased activity tolerance, decreased coordination, decreased endurance, decreased strength, increased fascial restrictions, increased muscle spasms, and pain.   ACTIVITY LIMITATIONS: toileting  PARTICIPATION LIMITATIONS: meal prep, cleaning, laundry, driving, shopping, and community activity  PERSONAL FACTORS: Time since onset of injury/illness/exacerbation and 1-2 comorbidities: Sarcoidosis; Cholecystectomy; Complex regional pain syndrome  are also affecting patient's functional outcome.   REHAB POTENTIAL: Excellent  CLINICAL DECISION MAKING: Evolving/moderate complexity  EVALUATION COMPLEXITY: Moderate   GOALS: Goals reviewed with patient? Yes  SHORT TERM GOALS: Target date: 11/12/22  Patient independent with fissure care.  Baseline: Goal status: INITIAL  2.  Patient educated on vaginal moisturizers to improve pelvic floor health.  Baseline:  Goal status: INITIAL  3.  Patient able to perform diaphragmatic breathing to relax her pelvic floor for toileting.  Baseline:  Goal status: INITIAL  4.  Patient understands how to massage around the anus prior to bowel movement.  Baseline:  Goal status: INITIAL   LONG TERM GOALS: Target date: 01/09/23  Patient  independent with advanced HEP for core and hip strength.  Baseline:  Goal status: INITIAL  2.  Patient is able to bulge her pelvic floor for correct toileting to assist with decreased strain on her fissure.  Baseline:  Goal status: INITIAL  3.  Patient is able to have a bowel movement with pain decreased >/= 75% due to the ability to relax her pelvic floor.  Baseline:  Goal status: INITIAL  4.  Patient is able to contract her lower abdomen instead of bulge it to correctly push the stool out.  Baseline:  Goal status: INITIAL  5.  CRAIG-7 score is </= 20 compared to 95  Baseline:  Goal status: INITIAL    PLAN:  PT FREQUENCY: 1x/week  PT DURATION: 12 weeks  PLANNED INTERVENTIONS: Therapeutic exercises, Therapeutic activity, Neuromuscular re-education, Patient/Family education, Joint mobilization, Dry Needling, Electrical stimulation, Spinal mobilization, Cryotherapy, Moist heat, Ultrasound, Biofeedback, and Manual therapy  PLAN FOR NEXT SESSION: rib mobility, diaphragmatic breathing, massage around the rectum, see how toileting is going, RUSI to work on pelvic floor contraction, see if need more dry needling. Go over vaginal moisturizers   Eulis Foster, PT 10/24/22 4:34 PM

## 2022-10-24 NOTE — Patient Instructions (Addendum)
How To Poop Better:  What are Good Poops? There is no one exact normal, but they should be REGULAR.  This varies from person to person and ranges from up to 3x/day or as little as 3-4/week.  This should stay consistent for you.  They should be formed and ideally one solid mass that doesn't fall apart or dissolve in the water and is brown in color.  Lifestyle Tips:  Fiber: Eat 25-31 grams per day Do not hold it.  If you need to go, GO! Try to go every day around the same time Walk and move more Probiotics for more healthy gut bacteria Water and fluids: half of your healthy body weight in ounces  Toileting Tips:  Posture: knees above hips, back flat, look straight ahead, RELAX Relax all the muscles from your face down to your toes Breathe: slow deep breaths into your belly and pelvic floor is RELAXED Blow: Tighten belly and blow like blowing up a balloon, make "SH" sound, make a vowel sound with a deep voice Do NOT sit more than 10 minutes After you are finished, tighten the muscles to reset pelvic floor back to normal      [facility] [address], [address] [place], [place] [address] Phone # [phone] Fax [phone]   Trigger Point Dry Needling  What is Trigger Point Dry Needling (DN)? DN is a physical therapy technique used to treat muscle pain and dysfunction. Specifically, DN helps deactivate muscle trigger points (muscle knots).  A thin filiform needle is used to penetrate the skin and stimulate the underlying trigger point. The goal is for a local twitch response (LTR) to occur and for the trigger point to relax. No medication of any kind is injected during the procedure.   What Does Trigger Point Dry Needling Feel Like?  The procedure feels different for each individual patient. Some patients report that they do not actually feel the needle enter the skin and overall the process is not painful. Very mild bleeding may occur. However, many  patients feel a deep cramping in the muscle in which the needle was inserted. This is the local twitch response.   How Will I feel after the treatment? Soreness is normal, and the onset of soreness may not occur for a few hours. Typically this soreness does not last longer than two days.  Bruising is uncommon, however; ice can be used to decrease any possible bruising.  In rare cases feeling tired or nauseous after the treatment is normal. In addition, your symptoms may get worse before they get better, this period will typically not last longer than 24 hours.   What Can I do After My Treatment? Increase your hydration by drinking more water for the next 24 hours. You may place ice or heat on the areas treated that have become sore, however, do not use heat on inflamed or bruised areas. Heat often brings more relief post needling. You can continue your regular activities, but vigorous activity is not recommended initially after the treatment for 24 hours. DN is best combined with other physical therapy such as strengthening, stretching, and other therapies.  [facility]  [address] [place] [address].  [phone]

## 2022-10-31 ENCOUNTER — Encounter: Payer: Self-pay | Admitting: Physical Therapy

## 2022-10-31 ENCOUNTER — Ambulatory Visit: Payer: Medicare HMO | Admitting: Physical Therapy

## 2022-10-31 DIAGNOSIS — M6289 Other specified disorders of muscle: Secondary | ICD-10-CM | POA: Diagnosis not present

## 2022-10-31 DIAGNOSIS — R252 Cramp and spasm: Secondary | ICD-10-CM

## 2022-10-31 DIAGNOSIS — R278 Other lack of coordination: Secondary | ICD-10-CM | POA: Diagnosis not present

## 2022-10-31 DIAGNOSIS — R29898 Other symptoms and signs involving the musculoskeletal system: Secondary | ICD-10-CM

## 2022-10-31 DIAGNOSIS — K6289 Other specified diseases of anus and rectum: Secondary | ICD-10-CM | POA: Diagnosis not present

## 2022-10-31 NOTE — Therapy (Signed)
OUTPATIENT PHYSICAL THERAPY FEMALE PELVIC TREATMENT   Patient Name: [name] [name] MRN: [id number] DOB:1965-09-19, 57 y.o., female Today's Date: 10/31/2022  END OF SESSION:  PT End of Session - 10/31/22 1532     Visit Number 3    Date for PT Re-Evaluation 01/09/23    Authorization Type [employer]    Authorization Time Period 10/17/2022-01/09/2023    Authorization - Visit Number 3    Authorization - Number of Visits 12    PT Start Time 1530    PT Stop Time 1610    PT Time Calculation (min) 40 min    Activity Tolerance Patient tolerated treatment well    Behavior During Therapy WFL for tasks assessed/performed             Past Medical History:  Diagnosis Date   Anxiety    Brain fog    Chronic pain    COMPLEX REGIONAL PAIN SYNDROME   Hemorrhoids    History of neck problems    dry needling being done & PT   Leg cramping 2020   right, says sometimes she has a hard time putting pressure on that leg    Migraine headache    Sarcoidosis    Past Surgical History:  Procedure Laterality Date   CHOLECYSTECTOMY     DILATION AND CURETTAGE OF UTERUS     ESOPHAGEAL MANOMETRY N/A 05/05/2018   Procedure: ESOPHAGEAL MANOMETRY (EM);  Surgeon: Vida Rigger, MD;  Location: [facility];  Service: Endoscopy;  Laterality: N/A;   SHOULDER ARTHROSCOPY Left 2008   THORACOSCOPY     TONSILLECTOMY AND ADENOIDECTOMY     AT AGE 20   WISDOM TOOTH EXTRACTION     Patient Active Problem List   Diagnosis Date Noted   Pelvic floor dysfunction 10/12/2022   Nasal congestion 06/16/2019   Post-nasal drainage 06/16/2019   Chronic migraine without aura, with intractable migraine, so stated, with status migrainosus 04/30/2019   Globus pharyngeus 04/14/2018   Laryngopharyngeal reflux (LPR) 10/30/2017   Lung nodule 10/30/2017   ANA positive 06/21/2014   Arthralgia 03/22/2014   Fatigue 03/22/2014   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   Radial tunnel syndrome 03/22/2014   Right foot pain 11/04/2012    Migraine with aura 02/06/2011   CONSTIPATION, CHRONIC 07/11/2007   DYSPHAGIA, PHARYNGOESOPHAGEAL PHASE 07/11/2007   NAUSEA WITH VOMITING 06/13/2007   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 I had 2 bowel movements without taking Alleve.  I found a different pelvic floor meditation that I felt better with.   PAIN:  Are you having pain? Yes NPRS scale: 8-10/10 Pain location:  rectal  Pain type: burning, sharp, and stabbing Pain description: intermittent   Aggravating factors: bowel movements Relieving factors: no bowel movements  PRECAUTIONS: None  WEIGHT BEARING RESTRICTIONS: No  FALLS:  Has patient fallen in last 6 months? No  LIVING ENVIRONMENT: Lives with: lives with their spouse   OCCUPATION: retired  PLOF: Independent  PATIENT GOALS: reduce pain and fissure management  PERTINENT HISTORY:  Sarcoidosis; Cholecystectomy; Complex regional pain syndrome   BOWEL MOVEMENT: Pain with bowel movement: Yes Type of bowel movement:Type (Bristol Stool Scale) Type 1, 4, Frequency 1-2 days, Strain Yes, and Splinting no Fully empty rectum: No Leakage: No Pads: No Fiber supplement: No  URINATION: Pain with urination: No Fully empty bladder: No, after urination she will hold her buttocks to get the rest of the urine out when she has a bowel  movement Stream:  average Urgency: Yes: sometimes Frequency: during day 4 times; night 1 time Leakage:  none Pads: No  INTERCOURSE: Pain with intercourse:  none Ability to have vaginal penetration:  Yes:   Climax: yes Marinoff Scale: 0/3  PREGNANCY: Vaginal deliveries 1 Tearing Yes: had some, episiotomy   PROLAPSE: None   OBJECTIVE:   DIAGNOSTIC FINDINGS:  none  PATIENT SURVEYS:  [id number] 95  COGNITION: Overall cognitive status: Within functional limits for tasks assessed     SENSATION: Light touch: Appears intact Proprioception: Appears intact   POSTURE: No Significant postural limitations  PELVIC ALIGNMENT:  LUMBARAROM/PROM:  A/PROM A/PROM  eval  Flexion Decreased by 25%  Extension Decreased by 25%  Right lateral flexion full  Left lateral flexion full  Right rotation full  Left rotation full   (Blank rows = not tested)  LOWER EXTREMITY ROM: bilateral hip ROM is full   LOWER EXTREMITY MMT:  MMT Right eval Left eval  Hip flexion 3+/5 5/5  Hip extension 4/5 5/5  Hip abduction 3/5 4/5  Hip adduction 4/5 5/5   PALPATION:   General  lower abdominals will bulge with contraction                 External Perineal Exam tenderness located on the right external anal sphincter, along the perineal body, pelvic floor contraction she will engage the gluteal, rectum is cauliflower shape.                              Internal Pelvic Floor not assessed  Patient confirms identification and approves PT to assess internal pelvic floor and treatment Yes  PELVIC MMT:   MMT eval  Vaginal   Internal Anal Sphincter 2/5  External Anal Sphincter 2/5  Puborectalis 2/5  Diastasis Recti 1 finger width above and below umbilicus  (Blank rows = not tested)        TONE: Unable to assess due to patient having open areas on her hemorrhoids and the rectum was very red. Patient had pain with the attempt to place the pinky finger into the canal so therapist did not  try. She placed [employer] onto the rectum.   PROLAPSE: none  TODAY'S TREATMENT:   10/31/22 Manual: Internal pelvic floor techniques: Going through the rectum using the [facility] working on the puborectalis, superior  transverse, iliococcygeus, mobilizing the coccyx with the other hand working externally Trigger Point Dry-Needling  Treatment instructions: Expect mild to moderate muscle soreness. S/S of pneumothorax if dry needled over a lung field, and to seek immediate medical attention should they occur. Patient verbalized understanding of these instructions and education.  Patient Consent Given: Yes Education handout provided: Yes Muscles treated:  perineal body, superior transverse, puborectalis Electrical stimulation performed: No Parameters: N/A Treatment response/outcome: elongation of muscle and trigger point response Neuromuscular re-education: Down training: Diaphragmatic breathing with therapist finger in the rectum working on bulging of the pelvic floor. Therapist could feel the rectal muscles twitching with trying to keep relaxed Exercises: Stretches/mobility: Piriformis stretch in sitting holding 30 sec bil.  Happy baby  in sitting holding 30 sec     10/24/22 Manual: Trigger Point Dry-Needling  Treatment instructions: Expect mild to moderate muscle soreness. S/S of pneumothorax if dry needled over a lung field, and to seek immediate medical attention should they occur. Patient verbalized understanding of these instructions and education.  Patient Consent Given: Yes Education handout provided: Yes Muscles treated: levator ani, external anal sphincter, perineal body, superior transverse Electrical stimulation performed: No Parameters: N/A Treatment response/outcome: elongation of muscle and trigger point response  Soft tissue mobilization: To assess for dry needling Manual work externally to levator ani, external anal sphincter,  puborectalis and perineal body  Neuromuscular re-education: Pelvic floor training Using tactile cues on the outside of the rectum working on isolated contraction and relaxation of the pelvic floor  Down training: Diaphragmatic breathing in sitting with therapist giving cues to the lateral rib cage to expand and have patient feel the pelvic floor relax Therapeutic activities: Functional strengthening activities: Education on correct toilet method for bowel movements and not to strain with knees above the hips, diaphragmatic breathing, keeping her body relaxed and how to breath out to relax the pelvic floor and build pressure to push the stool out.                                                                                                                               PATIENT EDUCATION: 10/31/22 Education details: Access Code: [email or web address] Person educated: Patient Education method: Explanation, Demonstration, Tactile cues, Verbal cues, and Handouts Education comprehension: verbalized understanding, returned demonstration, verbal cues required, tactile cues required, and needs further education    HOME EXERCISE PROGRAM: 10/31/22 Access Code: [email or web address] URL: [email or web address] Date: 10/31/2022 Prepared by: Eulis Foster  Exercises - Seated Piriformis Stretch with Trunk Bend  - 1 x daily - 7 x weekly - 1 sets - 2 reps - 30 sec hold - Supine Diaphragmatic Breathing  - 3 x daily - 7 x weekly - 1 sets - 10 reps - Seated Diaphragmatic Breathing  - 3 x daily - 7 x weekly - 1 sets - 10 reps - Seated Happy Baby With Trunk Flexion For Pelvic Relaxation  - 1 x daily - 7 x weekly - 1  sets - 10 reps - Seated Hip Internal Rotation AROM  - 1 x daily - 7 x weekly - 1 sets - 10 reps   ASSESSMENT:  CLINICAL IMPRESSION: Patient is a 57 y.o. female who was seen today for physical therapy  treatment for pelvic floor dysfunction. I have 2 bowel movements without having to take Alleve for  first time. Rectal strength is 2/5. Patient was able to bulge her pelvic floor several times with diaphragmatic breathing. She has some rectal muscle twitches with relaxation due to the muscles not used to relaxing.  Patient will benefit from skilled therapy to improve pelvic floor relaxation to have a bowel movement and understand fissure care.   OBJECTIVE IMPAIRMENTS: decreased activity tolerance, decreased coordination, decreased endurance, decreased strength, increased fascial restrictions, increased muscle spasms, and pain.   ACTIVITY LIMITATIONS: toileting  PARTICIPATION LIMITATIONS: meal prep, cleaning, laundry, driving, shopping, and community activity  PERSONAL FACTORS: Time since onset of injury/illness/exacerbation and 1-2 comorbidities: Sarcoidosis; Cholecystectomy; Complex regional pain syndrome  are also affecting patient's functional outcome.   REHAB POTENTIAL: Excellent  CLINICAL DECISION MAKING: Evolving/moderate complexity  EVALUATION COMPLEXITY: Moderate   GOALS: Goals reviewed with patient? Yes  SHORT TERM GOALS: Target date: 11/12/22  Patient independent with fissure care.  Baseline: Goal status: INITIAL  2.  Patient educated on vaginal moisturizers to improve pelvic floor health.  Baseline:  Goal status: INITIAL  3.  Patient able to perform diaphragmatic breathing to relax her pelvic floor for toileting.  Baseline:  Goal status: Met 10/31/22  4.  Patient understands how to massage around the anus prior to bowel movement.  Baseline:  Goal status: INITIAL   LONG TERM GOALS: Target date: 01/09/23  Patient independent with advanced HEP for core and hip strength.  Baseline:  Goal status: INITIAL  2.  Patient is able to bulge her pelvic floor for correct toileting to assist with decreased strain on her fissure.  Baseline:  Goal status: INITIAL  3.  Patient is able to have a bowel movement with pain decreased >/= 75% due to the ability to relax her pelvic  floor.  Baseline:  Goal status: INITIAL  4.  Patient is able to contract her lower abdomen instead of bulge it to correctly push the stool out.  Baseline:  Goal status: INITIAL  5.  CRAIG-7 score is </= 20 compared to 95  Baseline:  Goal status: INITIAL    PLAN:  PT FREQUENCY: 1x/week  PT DURATION: 12 weeks  PLANNED INTERVENTIONS: Therapeutic exercises, Therapeutic activity, Neuromuscular re-education, Patient/Family education, Joint mobilization, Dry Needling, Electrical stimulation, Spinal mobilization, Cryotherapy, Moist heat, Ultrasound, Biofeedback, and Manual therapy  PLAN FOR NEXT SESSION: rib mobility, diaphragmatic breathing, massage around the rectum, see how toileting is going, RUSI to work on pelvic floor contraction, see if need more dry needling. Go over vaginal moisturizers   Eulis Foster, PT 10/31/22 4:15 PM

## 2022-11-14 ENCOUNTER — Ambulatory Visit: Payer: Medicare HMO | Attending: Family Medicine | Admitting: Physical Therapy

## 2022-11-14 ENCOUNTER — Encounter: Payer: Self-pay | Admitting: Physical Therapy

## 2022-11-14 DIAGNOSIS — R252 Cramp and spasm: Secondary | ICD-10-CM | POA: Diagnosis not present

## 2022-11-14 DIAGNOSIS — R29898 Other symptoms and signs involving the musculoskeletal system: Secondary | ICD-10-CM | POA: Insufficient documentation

## 2022-11-14 DIAGNOSIS — R278 Other lack of coordination: Secondary | ICD-10-CM | POA: Insufficient documentation

## 2022-11-14 DIAGNOSIS — K6289 Other specified diseases of anus and rectum: Secondary | ICD-10-CM | POA: Diagnosis not present

## 2022-11-14 NOTE — Patient Instructions (Signed)
Moisturizers They are used in the vagina to hydrate the mucous membrane that make up the vaginal canal. Designed to keep a more normal acid balance (ph) Once placed in the vagina, it will last between two to three days.  Use 2-3 times per week at bedtime  Ingredients to avoid is glycerin and fragrance, can increase chance of infection Should not be used just before sex due to causing irritation Most are gels administered either in a tampon-shaped applicator or as a vaginal suppository. They are non-hormonal.   Types of Moisturizers(internal use)  Vitamin E vaginal suppositories- Whole foods, Amazon Moist Again Coconut oil- can break down condoms Julva- (Do no use if on Tamoxifen) amazon Yes moisturizer- amazon NeuEve Silk , NeuEve Silver for menopausal or over 65 (if have severe vaginal atrophy or cancer treatments use NeuEve Silk for  1 month than move to NeuEve Silver)- Amazon, Neuve.com Olive and Bee intimate cream- [email or web address] Mae vaginal moisturizer- Amazon Aloe Good Clean Love Hyaluronic acid Hyalofemme replens   Creams to use externally on the Vulva area Desert Harvest Releveum (good for for cancer patients that had radiation to the area)- amazon or [email or web address] V-magic cream - amazon Julva-amazon Vital "V Wild Yam salve ( help moisturize and help with thinning vulvar area, does have Beeswax MoodMaid Botanical Pro-Meno Wild Yam Cream- Amazon Desert Harvest Gele Cleo by Damiva labial moisturizer (Amazon,  Coconut or olive oil aloe Good Clean Love Enchanted Rose by intimate rose  Things to avoid in the vaginal area Do not use things to irritate the vulvar area No lotions just specialized creams for the vulva area- Neogyn, V-magic, No soaps; can use Aveeno or Calendula cleanser if needed. Must be gentle No deodorants No douches Good to sleep without underwear to let the vaginal area to air out No scrubbing: spread the lips to let warm water rinse  over labias and pat dry  [facility] [address], [address] [place], [place] [address] Phone # [phone] Fax [phone]  

## 2022-11-14 NOTE — Therapy (Signed)
OUTPATIENT PHYSICAL THERAPY FEMALE PELVIC TREATMENT   Patient Name: [name] [name] MRN: [id number] DOB:09-08-1965, 57 y.o., female Today's Date: 11/14/2022  END OF SESSION:  PT End of Session - 11/14/22 0845     Visit Number 4    Date for PT Re-Evaluation 01/09/23    Authorization Type [employer]    Authorization Time Period 10/17/2022-01/09/2023    Authorization - Visit Number 4    Authorization - Number of Visits 12    PT Start Time 0845    PT Stop Time 0925    PT Time Calculation (min) 40 min    Activity Tolerance Patient tolerated treatment well    Behavior During Therapy WFL for tasks assessed/performed             Past Medical History:  Diagnosis Date   Anxiety    Brain fog    Chronic pain    COMPLEX REGIONAL PAIN SYNDROME   Hemorrhoids    History of neck problems    dry needling being done & PT   Leg cramping 2020   right, says sometimes she has a hard time putting pressure on that leg    Migraine headache    Sarcoidosis    Past Surgical History:  Procedure Laterality Date   CHOLECYSTECTOMY     DILATION AND CURETTAGE OF UTERUS     ESOPHAGEAL MANOMETRY N/A 05/05/2018   Procedure: ESOPHAGEAL MANOMETRY (EM);  Surgeon: Vida Rigger, MD;  Location: [facility];  Service: Endoscopy;  Laterality: N/A;   SHOULDER ARTHROSCOPY Left 2008   THORACOSCOPY     TONSILLECTOMY AND ADENOIDECTOMY     AT AGE 67   WISDOM TOOTH EXTRACTION     Patient Active Problem List   Diagnosis Date Noted   Pelvic floor dysfunction 10/12/2022   Nasal congestion 06/16/2019   Post-nasal drainage 06/16/2019   Chronic migraine without aura, with intractable migraine, so stated, with status migrainosus 04/30/2019   Globus pharyngeus 04/14/2018   Laryngopharyngeal reflux (LPR) 10/30/2017   Lung nodule 10/30/2017   ANA positive 06/21/2014   Arthralgia 03/22/2014   Fatigue 03/22/2014   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   Radial tunnel syndrome 03/22/2014   Right foot pain 11/04/2012    Migraine with aura 02/06/2011   CONSTIPATION, CHRONIC 07/11/2007   DYSPHAGIA, PHARYNGOESOPHAGEAL PHASE 07/11/2007   NAUSEA WITH VOMITING 06/13/2007   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    PAIN:  Are you having pain? Yes NPRS scale: 7/10 Pain location:  rectal  Pain type: burning, sharp, and stabbing Pain description: intermittent   Aggravating factors: bowel movements Relieving factors: no bowel movements  PRECAUTIONS: None  WEIGHT BEARING RESTRICTIONS: No  FALLS:  Has patient fallen in last 6 months? No  LIVING ENVIRONMENT: Lives with: lives with their spouse   OCCUPATION: retired  PLOF: Independent  PATIENT GOALS: reduce pain and fissure management  PERTINENT HISTORY:  Sarcoidosis; Cholecystectomy; Complex regional pain syndrome   BOWEL MOVEMENT: Pain with bowel movement: Yes Type of bowel movement:Type (Bristol Stool Scale) Type 1, 4, Frequency 1-2 days, Strain Yes, and Splinting no Fully empty rectum: No Leakage: No Pads: No Fiber supplement: No  URINATION: Pain with urination: No Fully empty bladder: No, after urination she will hold her buttocks to get the rest of the urine out when she has a bowel movement Stream:  average Urgency: Yes: sometimes Frequency: during day 4  times; night 1 time Leakage:  none Pads: No  INTERCOURSE: Pain with intercourse:  none Ability to have vaginal penetration:  Yes:   Climax: yes Marinoff Scale: 0/3  PREGNANCY: Vaginal deliveries 1 Tearing Yes: had some, episiotomy   PROLAPSE: None   OBJECTIVE:   DIAGNOSTIC FINDINGS:  none  PATIENT SURVEYS:  CRAIQ-7 95  COGNITION: Overall cognitive status: Within functional limits for tasks assessed     SENSATION: Light touch: Appears intact Proprioception: Appears intact   POSTURE: No Significant postural limitations  PELVIC ALIGNMENT:  LUMBARAROM/PROM:  A/PROM A/PROM  eval  Flexion Decreased by 25%  Extension Decreased by 25%  Right lateral flexion full  Left lateral flexion full  Right rotation full  Left rotation full   (Blank rows = not tested)  LOWER EXTREMITY ROM: bilateral hip ROM is full   LOWER EXTREMITY MMT:  MMT Right eval Left eval  Hip flexion 3+/5 5/5  Hip extension 4/5 5/5  Hip abduction 3/5 4/5  Hip adduction 4/5 5/5   PALPATION:   General  lower abdominals will bulge with contraction                 External Perineal Exam tenderness located on the right external anal sphincter, along the perineal body, pelvic floor contraction she will engage the gluteal, rectum is cauliflower shape.                              Internal Pelvic Floor not assessed  Patient confirms identification and approves PT to assess internal pelvic floor and treatment Yes  PELVIC MMT:   MMT eval 11/14/22  Vaginal    Internal Anal Sphincter 2/5 2/5 with very little lift  External Anal Sphincter 2/5 2/5 with very little lift  Puborectalis 2/5 2/5 with taping to come forward  Diastasis Recti 1 finger width above and below umbilicus   (Blank rows = not tested)        TONE: Unable to assess due to patient having open areas on her hemorrhoids and the rectum was very red. Patient had pain with the attempt to place the pinky finger into the canal so  therapist did not try. She placed [employer] onto the rectum.   PROLAPSE: none  TODAY'S TREATMENT:   11/14/22 Manual: Internal pelvic floor techniques: No emotional/communication barriers  or cognitive limitation. Patient is motivated to learn. Patient understands and agrees with treatment goals and plan. PT explains patient will be examined in standing, sitting, and lying down to see how their muscles and joints work. When they are ready, they will be asked to remove their underwear so PT can examine their perineum. The patient is also given the option of providing their own chaperone as one is not provided in our facility. The patient also has the right and is explained the right to defer or refuse any part of the evaluation or treatment including the internal exam. With the patient's consent, PT will use one gloved finger to gently assess the muscles of the pelvic floor, seeing how well it contracts and relaxes and if there is muscle symmetry. After, the patient will get dressed and PT and patient will discuss exam findings and plan of care. PT and patient discuss plan of care, schedule, attendance policy and HEP activities.  Going through the rectum using the [facility] Reveleum working on the puborectalis, superior transverse, iliococcygeus, mobilizing the coccyx with the other hand working externally [employer] Dry-Needling  Treatment instructions: Expect mild to moderate muscle soreness. S/S of pneumothorax if dry needled over a lung field, and to seek immediate medical attention should they occur. Patient verbalized understanding of these instructions and education.  Patient Consent Given: Yes Education handout provided: Yes Muscles treated:  perineal body, superior transverse, puborectalis, coccygeus Electrical stimulation performed: No Parameters: N/A Treatment response/outcome: elongation of muscle and trigger point response Neuromuscular re-education: Pelvic floor  contraction training: Therapist finger in the rectum working on contraction with lift, taping the puborectalis to come forward then work on pushing therapist finger out of the rectum  10/31/22 Manual: Internal pelvic floor techniques: Going through the rectum using the [employer] Reveleum working on the puborectalis, superior transverse, iliococcygeus, mobilizing the coccyx with the other hand working externally [employer] Dry-Needling  Treatment instructions: Expect mild to moderate muscle soreness. S/S of pneumothorax if dry needled over a lung field, and to seek immediate medical attention should they occur. Patient verbalized understanding of these instructions and education.  Patient Consent Given: Yes Education handout provided: Yes Muscles treated:  perineal body, superior transverse, puborectalis Electrical stimulation performed: No Parameters: N/A Treatment response/outcome: elongation of muscle and trigger point response Neuromuscular re-education: Down training: Diaphragmatic breathing with therapist finger in the rectum working on bulging of the pelvic floor. Therapist could feel the rectal muscles twitching with trying to keep relaxed Exercises: Stretches/mobility: Piriformis stretch in sitting holding 30 sec bil.  Happy baby  in sitting holding 30 sec     10/24/22 Manual: Trigger Point Dry-Needling  Treatment instructions: Expect mild to moderate muscle soreness. S/S of pneumothorax if dry needled over a lung field, and to seek immediate medical attention should they occur. Patient verbalized understanding of these instructions and education.  Patient Consent Given: Yes Education handout provided: Yes Muscles treated: levator ani, external anal sphincter, perineal body, superior transverse Electrical stimulation performed: No Parameters: N/A Treatment response/outcome: elongation of muscle and trigger point response  Soft tissue mobilization: To assess for dry  needling Manual work externally to levator ani, external anal sphincter, puborectalis and perineal body  Neuromuscular re-education: Pelvic floor training Using tactile cues on the outside of the rectum working on isolated contraction and relaxation of the pelvic floor  Down training: Diaphragmatic breathing in sitting with therapist giving cues to the lateral rib cage to expand and have patient feel the pelvic  floor relax Therapeutic activities: Functional strengthening activities: Education on correct toilet method for bowel movements and not to strain with knees above the hips, diaphragmatic breathing, keeping her body relaxed and how to breath out to relax the pelvic floor and build pressure to push the stool out.                                                                                                                               PATIENT EDUCATION: 11/14/22 Education details: Access Code: [email or web address] Person educated: Patient Education method: Explanation, Demonstration, Tactile cues, Verbal cues, and Handouts Education comprehension: verbalized understanding, returned demonstration, verbal cues required, tactile cues required, and needs further education    HOME EXERCISE PROGRAM: 11/14/22 Access Code: [email or web address] URL: [email or web address] Date: 11/14/2022 Prepared by: Eulis Foster  Exercises -- Supine Pelvic Floor Contraction  - 1 x daily - 7 x weekly - 2 sets - 5 reps    ASSESSMENT:  CLINICAL IMPRESSION: Patient is a 57 y.o. female who was seen today for physical therapy  treatment for pelvic floor dysfunction. Patient will take an Alleve 60% of the time now and only takes 1 instead of 2. Patient is starting to lose the fear to have to go to the bathroom. Patient is going to the bathroom more times due to less fear. She is able to push the therapist finger out of the canal with greater ease. She had several trigger points in the pelvic lfoor includng righ  tiliococcugeus and anterior portion of puborectalis. Patient will benefit from skilled therapy to improve pelvic floor relaxation to have a bowel movement and understand fissure care.   OBJECTIVE IMPAIRMENTS: decreased activity tolerance, decreased coordination, decreased endurance, decreased strength, increased fascial restrictions, increased muscle spasms, and pain.   ACTIVITY LIMITATIONS: toileting  PARTICIPATION LIMITATIONS: meal prep, cleaning, laundry, driving, shopping, and community activity  PERSONAL FACTORS: Time since onset of injury/illness/exacerbation and 1-2 comorbidities: Sarcoidosis; Cholecystectomy; Complex regional pain syndrome  are also affecting patient's functional outcome.   REHAB POTENTIAL: Excellent  CLINICAL DECISION MAKING: Evolving/moderate complexity  EVALUATION COMPLEXITY: Moderate   GOALS: Goals reviewed with patient? Yes  SHORT TERM GOALS: Target date: 11/12/22  Patient independent with fissure care.  Baseline: Goal status: 11/14/22  2.  Patient educated on vaginal moisturizers to improve pelvic floor health.  Baseline:  Goal status: 11/14/22  3.  Patient able to perform diaphragmatic breathing to relax her pelvic floor for toileting.  Baseline:  Goal status: Met 10/31/22  4.  Patient understands how to massage around the anus prior to bowel movement.  Baseline:  Goal status: INITIAL   LONG TERM GOALS: Target date: 01/09/23  Patient independent with advanced HEP for core and hip strength.  Baseline:  Goal status: INITIAL  2.  Patient is able to bulge her pelvic floor for correct toileting to assist with decreased strain on her fissure.  Baseline:  Goal status: INITIAL  3.  Patient is able to have  a bowel movement with pain decreased >/= 75% due to the ability to relax her pelvic floor.  Baseline:  Goal status: INITIAL  4.  Patient is able to contract her lower abdomen instead of bulge it to correctly push the stool out.  Baseline:  Goal  status: INITIAL  5.  CRAIG-7 score is </= 20 compared to 95  Baseline:  Goal status: INITIAL    PLAN:  PT FREQUENCY: 1x/week  PT DURATION: 12 weeks  PLANNED INTERVENTIONS: Therapeutic exercises, Therapeutic activity, Neuromuscular re-education, Patient/Family education, Joint mobilization, Dry Needling, Electrical stimulation, Spinal mobilization, Cryotherapy, Moist heat, Ultrasound, Biofeedback, and Manual therapy  PLAN FOR NEXT SESSION: rib mobility, diaphragmatic breathing, massage around the rectum, see how toileting is going, RUSI to work on pelvic floor contraction, see if need more dry needling.    Eulis Foster, PT 11/14/22 9:32 AM

## 2022-11-19 ENCOUNTER — Ambulatory Visit: Payer: Medicare HMO | Admitting: Physical Therapy

## 2022-11-22 NOTE — Progress Notes (Signed)
[name] [address] [place] [address] Phone: [phone] Subjective:   [name] [name], am serving as a scribe for Dr. Antoine Primas.  I'm seeing this patient by the request  of:  Tisovec, Adelfa Koh, MD  CC: Back and neck pain follow-up  [id number]  [name] is a 57 y.o. female coming in with complaint of back and neck pain. OMT 10/12/2022. Patient states that neck pain continues to be intermittent. Has been doing Botox treatments.   Has been doing pelvic floor physical therapy.     Medications patient has been prescribed: None          Reviewed prior external information including notes and imaging from previsou exam, outside providers and external EMR if available.   As well as notes that were available from care everywhere and other healthcare systems.  Past medical history, social, surgical and family history all reviewed in electronic medical record.  No pertanent information unless stated regarding to the chief complaint.   Past Medical History:  Diagnosis Date   Anxiety    Brain fog    Chronic pain    COMPLEX REGIONAL PAIN SYNDROME   Hemorrhoids    History of neck problems    dry needling being done & PT   Leg cramping 2020   right, says sometimes she has a hard time putting pressure on that leg    Migraine headache    Sarcoidosis     Allergies  Allergen Reactions   Levaquin [Levofloxacin In D5w]     Severe rash   Metoclopramide Hcl    Penicillins     GI upset   Reglan [Metoclopramide] Anxiety     Review of Systems:  No  visual changes, nausea, vomiting, diarrhea, constipation, dizziness, abdominal pain,  fevers, chills, night sweats, weight loss, swollen lymph nodes, body aches, joint swelling, chest pain, shortness of breath, mood changes. POSITIVE muscle aches, skin rash, headache  Objective  Blood pressure (!) 86/62, pulse 96, height 5\' 1"  (1.549 m), weight 105 lb (47.6 kg), last menstrual  period 01/05/2011, SpO2 98 %.   General: No apparent distress alert and oriented x3 mood and affect normal, dressed appropriately.  HEENT: Pupils equal, extraocular movements intact  Respiratory: Patient's speak in full sentences and does not appear short of breath  Cardiovascular: No lower extremity edema, non tender, no erythema  Neck exam does have more discomfort still.  Some limited sidebending bilaterally.  Patient has some tightness noted in the parascapular area bilaterally.  Negative Spurling's of the neck noted today. Patient does have a rash on her torso on the anterior aspect appears to be fungal   Osteopathic findings  C2 flexed rotated and side bent left C6 flexed rotated and side bent left T3 extended rotated and side bent right inhaled rib T4 extended rotated and side bent left    Assessment and Plan:  Chronic migraine without aura, with intractable migraine, so stated, with status migrainosus Patient is already doing better with her headache still at this time.  We discussed with patient other treatment options which include different medications but feels like she has made significant improvement.  Does not want to make any other than large changes at the moment.  Discussed about medications patient would know what options we have that were still available.  Responding well to osteopathic manipulation.  We did discuss laboratory workup showing low ferritin and using vitamin C but not wanting to do true iron  supplementation secondary to her GI or underlying problems.  Follow-up with me again in 2 months  Fungal rash of torso Diflucan given for this problem.  Worsening will need to send to dermatology for biopsy.    Nonallopathic problems  Decision today to treat with OMT was based on Physical Exam  After verbal consent patient was treated with HVLA, ME, FPR techniques in cervical, rib, thoracic, areas  Patient tolerated the procedure well with improvement in  symptoms  Patient given exercises, stretches and lifestyle modifications  See medications in patient instructions if given  Patient will follow up in 8 weeks     The above documentation has been reviewed and is accurate and complete Judi Saa, DO         Note: This dictation was prepared with Dragon dictation along with smaller phrase technology. Any transcriptional errors that result from this process are unintentional.

## 2022-11-23 ENCOUNTER — Encounter: Payer: Self-pay | Admitting: Family Medicine

## 2022-11-23 ENCOUNTER — Ambulatory Visit: Payer: Medicare HMO | Admitting: Family Medicine

## 2022-11-23 VITALS — BP 86/62 | HR 96 | Ht 61.0 in | Wt 105.0 lb

## 2022-11-23 DIAGNOSIS — B369 Superficial mycosis, unspecified: Secondary | ICD-10-CM | POA: Diagnosis not present

## 2022-11-23 DIAGNOSIS — G43711 Chronic migraine without aura, intractable, with status migrainosus: Secondary | ICD-10-CM | POA: Diagnosis not present

## 2022-11-23 DIAGNOSIS — M9908 Segmental and somatic dysfunction of rib cage: Secondary | ICD-10-CM

## 2022-11-23 DIAGNOSIS — M9902 Segmental and somatic dysfunction of thoracic region: Secondary | ICD-10-CM | POA: Diagnosis not present

## 2022-11-23 DIAGNOSIS — M9901 Segmental and somatic dysfunction of cervical region: Secondary | ICD-10-CM | POA: Diagnosis not present

## 2022-11-23 MED ORDER — FLUCONAZOLE 150 MG PO TABS
150.0000 mg | ORAL_TABLET | Freq: Every day | ORAL | 0 refills | Status: AC
Start: 2022-11-23 — End: ?

## 2022-11-23 NOTE — Assessment & Plan Note (Signed)
Diflucan given for this problem.  Worsening will need to send to dermatology for biopsy.

## 2022-11-23 NOTE — Assessment & Plan Note (Signed)
Patient is already doing better with her headache still at this time.  We discussed with patient other treatment options which include different medications but feels like she has made significant improvement.  Does not want to make any other than large changes at the moment.  Discussed about medications patient would know what options we have that were still available.  Responding well to osteopathic manipulation.  We did discuss laboratory workup showing low ferritin and using vitamin C but not wanting to do true iron supplementation secondary to her GI or underlying problems.  Follow-up with me again in 2 months

## 2022-11-23 NOTE — Patient Instructions (Signed)
Take 500mg  of vit c with iron containing meals [employer] sandals in house Gravity defyer  Diflucan daily for 3 days Ankle exercises See me again in 2 months

## 2022-11-26 ENCOUNTER — Encounter: Payer: Self-pay | Admitting: Physical Therapy

## 2022-11-26 ENCOUNTER — Ambulatory Visit: Payer: Medicare HMO | Admitting: Physical Therapy

## 2022-11-26 DIAGNOSIS — R278 Other lack of coordination: Secondary | ICD-10-CM

## 2022-11-26 DIAGNOSIS — R29898 Other symptoms and signs involving the musculoskeletal system: Secondary | ICD-10-CM | POA: Diagnosis not present

## 2022-11-26 DIAGNOSIS — R252 Cramp and spasm: Secondary | ICD-10-CM

## 2022-11-26 DIAGNOSIS — K6289 Other specified diseases of anus and rectum: Secondary | ICD-10-CM

## 2022-11-26 NOTE — Therapy (Signed)
OUTPATIENT PHYSICAL THERAPY FEMALE PELVIC TREATMENT   Patient Name: [name] [name] MRN: [id number] DOB:10-10-1965, 57 y.o., female Today's Date: 11/26/2022  END OF SESSION:  PT End of Session - 11/26/22 0805     Visit Number 5    Date for PT Re-Evaluation 01/09/23    Authorization Type [employer]    Authorization Time Period 10/17/2022-01/09/2023    Authorization - Visit Number 5    Authorization - Number of Visits 12    Progress Note Due on Visit 10    PT Start Time 0800    PT Stop Time 0840    PT Time Calculation (min) 40 min    Activity Tolerance Patient tolerated treatment well    Behavior During Therapy WFL for tasks assessed/performed             Past Medical History:  Diagnosis Date   Anxiety    Brain fog    Chronic pain    COMPLEX REGIONAL PAIN SYNDROME   Hemorrhoids    History of neck problems    dry needling being done & PT   Leg cramping 2020   right, says sometimes she has a hard time putting pressure on that leg    Migraine headache    Sarcoidosis    Past Surgical History:  Procedure Laterality Date   CHOLECYSTECTOMY     DILATION AND CURETTAGE OF UTERUS     ESOPHAGEAL MANOMETRY N/A 05/05/2018   Procedure: ESOPHAGEAL MANOMETRY (EM);  Surgeon: Vida Rigger, MD;  Location: [facility];  Service: Endoscopy;  Laterality: N/A;   SHOULDER ARTHROSCOPY Left 2008   THORACOSCOPY     TONSILLECTOMY AND ADENOIDECTOMY     AT AGE 52   WISDOM TOOTH EXTRACTION     Patient Active Problem List   Diagnosis Date Noted   Fungal rash of torso 11/23/2022   Pelvic floor dysfunction 10/12/2022   Nasal congestion 06/16/2019   Post-nasal drainage 06/16/2019   Chronic migraine without aura, with intractable migraine, so stated, with status migrainosus 04/30/2019   Globus pharyngeus 04/14/2018   Laryngopharyngeal reflux (LPR) 10/30/2017   Lung nodule 10/30/2017   ANA positive 06/21/2014   Arthralgia 03/22/2014   Fatigue 03/22/2014   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    Radial tunnel syndrome 03/22/2014   Right foot pain 11/04/2012   Migraine with aura 02/06/2011   CONSTIPATION, CHRONIC 07/11/2007   DYSPHAGIA, PHARYNGOESOPHAGEAL PHASE 07/11/2007   NAUSEA WITH VOMITING 06/13/2007   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 I saw Dr. Katrinka Blazing and he adjusted my neck. The dry needling helps.     PAIN:  Are you having pain? Yes NPRS scale: 7/10 Pain location:  rectal  Pain type: burning, sharp, and stabbing Pain description: intermittent   Aggravating factors: bowel movements Relieving factors: no bowel movements  PRECAUTIONS: None  WEIGHT BEARING RESTRICTIONS: No  FALLS:  Has patient fallen in last 6 months? No  LIVING ENVIRONMENT: Lives with: lives with their spouse   OCCUPATION: retired  PLOF: Independent  PATIENT GOALS: reduce pain and fissure management  PERTINENT HISTORY:  Sarcoidosis; Cholecystectomy; Complex regional pain syndrome   BOWEL MOVEMENT: Pain with bowel movement: Yes Type of bowel movement:Type (Bristol Stool Scale) Type 1, 4, Frequency 1-2 days, Strain Yes, and Splinting no Fully empty rectum: No Leakage: No Pads: No Fiber supplement: No  URINATION: Pain with urination: No Fully empty bladder: No, after urination she will hold her buttocks to get the rest of the urine out when she has a  bowel movement Stream:  average Urgency: Yes: sometimes Frequency: during day 4 times; night 1 time Leakage:  none Pads: No  INTERCOURSE: Pain with intercourse:  none Ability to have vaginal penetration:  Yes:   Climax: yes Marinoff Scale: 0/3  PREGNANCY: Vaginal deliveries 1 Tearing Yes: had some, episiotomy   PROLAPSE: None   OBJECTIVE:   DIAGNOSTIC FINDINGS:  none  PATIENT SURVEYS:  [id number]  COGNITION: Overall cognitive status: Within functional limits for tasks assessed     SENSATION: Light touch: Appears intact Proprioception: Appears intact   POSTURE: No Significant postural limitations  PELVIC ALIGNMENT:  LUMBARAROM/PROM:  A/PROM A/PROM  eval  Flexion Decreased by 25%  Extension Decreased by 25%  Right lateral flexion full  Left lateral flexion full  Right rotation full  Left rotation full   (Blank rows = not tested)  LOWER EXTREMITY ROM: bilateral hip ROM is full   LOWER EXTREMITY MMT:  MMT Right eval Left eval  Hip flexion 3+/5 5/5  Hip extension 4/5 5/5  Hip abduction 3/5 4/5  Hip adduction 4/5 5/5   PALPATION:   General  lower abdominals will bulge with contraction                 External Perineal Exam tenderness located on the right external anal sphincter, along the perineal body, pelvic floor contraction she will engage the gluteal, rectum is cauliflower shape.                              Internal Pelvic Floor not assessed  Patient confirms identification and approves PT to assess internal pelvic floor and treatment Yes  PELVIC MMT:   MMT eval 11/14/22  Vaginal    Internal Anal Sphincter 2/5 2/5 with very little lift  External Anal Sphincter 2/5 2/5 with very little lift  Puborectalis 2/5 2/5 with taping to come forward  Diastasis Recti 1 finger width above and below umbilicus   (Blank rows = not tested)        TONE: Unable to assess due to patient having open areas on her hemorrhoids and the rectum was very red.  Patient had pain with the attempt to place the pinky finger into the canal so therapist did not try. She placed [employer] onto the rectum.   PROLAPSE: none  TODAY'S TREATMENT:   11/26/22 Manual: Internal pelvic floor techniques: No  emotional/communication barriers or cognitive limitation. Patient is motivated to learn. Patient understands and agrees with treatment goals and plan. PT explains patient will be examined in standing, sitting, and lying down to see how their muscles and joints work. When they are ready, they will be asked to remove their underwear so PT can examine their perineum. The patient is also given the option of providing their own chaperone as one is not provided in our facility. The patient also has the right and is explained the right to defer or refuse any part of the evaluation or treatment including the internal exam. With the patient's consent, PT will use one gloved finger to gently assess the muscles of the pelvic floor, seeing how well it contracts and relaxes and if there is muscle symmetry. After, the patient will get dressed and PT and patient will discuss exam findings and plan of care. PT and patient discuss plan of care, schedule, attendance policy and HEP activities.  Fascial release around the rectum externally to improve tissue mobility and reduce pain Trigger Point Dry-Needling  Treatment instructions: Expect mild to moderate muscle soreness. S/S of pneumothorax if dry needled over a lung field, and to seek immediate medical attention should they occur. Patient verbalized understanding of these instructions and education.  Patient Consent Given: Yes Education handout provided: Yes Muscles treated:  perineal body,  puborectalis, Electrical stimulation performed: No Parameters: N/A Treatment response/outcome: elongation of muscle and trigger point response Exercises: Stretches/mobility: Educated patient about rectal dilators and how they could  stretch the rectal tissue so it is easier to lengthen and no have the pain and not have the rectal tissue prolapse Therapeutic activities: Functional strengthening activities: Educated patient on how to toilet with relaxation, stepping away if the stool does not come out, it is alright to relax and not force the stool to come out.  11/14/22 Manual: Internal pelvic floor techniques: No emotional/communication barriers or cognitive limitation. Patient is motivated to learn. Patient understands and agrees with treatment goals and plan. PT explains patient will be examined in standing, sitting, and lying down to see how their muscles and joints work. When they are ready, they will be asked to remove their underwear so PT can examine their perineum. The patient is also given the option of providing their own chaperone as one is not provided in our facility. The patient also has the right and is explained the right to defer or refuse any part of the evaluation or treatment including the internal exam. With the patient's consent, PT will use one gloved finger to gently assess the muscles of the pelvic floor, seeing how well it contracts and relaxes and if there is muscle symmetry. After, the patient will get dressed and PT and patient will discuss exam findings and plan of care. PT and patient discuss plan of care, schedule, attendance policy and HEP activities.  Going through the rectum using the [facility]um working on the puborectalis, superior transverse, iliococcygeus, mobilizing the coccyx with the other hand working externally [employer] Dry-Needling  Treatment instructions: Expect mild to moderate muscle soreness. S/S of pneumothorax if dry needled over a lung field, and to seek immediate medical attention should they occur. Patient verbalized understanding of these instructions and education.  Patient Consent Given: Yes Education handout provided: Yes Muscles treated:  perineal body,  superior transverse, puborectalis, coccygeus Electrical stimulation performed: No Parameters: N/A Treatment response/outcome: elongation of muscle and trigger point response Neuromuscular re-education: Pelvic floor contraction training: Therapist finger  in the rectum working on contraction with lift, taping the puborectalis to come forward then work on pushing therapist finger out of the rectum  10/31/22 Manual: Internal pelvic floor techniques: Going through the rectum using the [facility] Reveleum working on the puborectalis, superior transverse, iliococcygeus, mobilizing the coccyx with the other hand working externally [employer] Dry-Needling  Treatment instructions: Expect mild to moderate muscle soreness. S/S of pneumothorax if dry needled over a lung field, and to seek immediate medical attention should they occur. Patient verbalized understanding of these instructions and education.  Patient Consent Given: Yes Education handout provided: Yes Muscles treated:  perineal body, superior transverse, puborectalis Electrical stimulation performed: No Parameters: N/A Treatment response/outcome: elongation of muscle and trigger point response Neuromuscular re-education: Down training: Diaphragmatic breathing with therapist finger in the rectum working on bulging of the pelvic floor. Therapist could feel the rectal muscles twitching with trying to keep relaxed Exercises: Stretches/mobility: Piriformis stretch in sitting holding 30 sec bil.  Happy baby  in sitting holding 30 sec                                                                                                                                  PATIENT EDUCATION: 11/14/22 Education details: Access Code: [email or web address] Person educated: Patient Education method: Explanation, Demonstration, Tactile cues, Verbal cues, and Handouts Education comprehension: verbalized understanding, returned demonstration, verbal cues required,  tactile cues required, and needs further education    HOME EXERCISE PROGRAM: 11/14/22 Access Code: [email or web address] URL: [email or web address] Date: 11/14/2022 Prepared by: Eulis Foster  Exercises -- Supine Pelvic Floor Contraction  - 1 x daily - 7 x weekly - 2 sets - 5 reps    ASSESSMENT:  CLINICAL IMPRESSION: Patient is a 57 y.o. female who was seen today for physical therapy  treatment for pelvic floor dysfunction. Patient pain decreased to 0/10 on the right side of the rectum and 4/10 on the left side. She had rectal tissue prolapse prior to treatment and afterwards is decreased after treatment. There was som blood on the prolapse tissue from the rectum. Educated patient on using a rectal dilator to stretch the tissue so it can elongate with bowel movements.  Patient will benefit from skilled therapy to improve pelvic floor relaxation to have a bowel movement and understand fissure care.   OBJECTIVE IMPAIRMENTS: decreased activity tolerance, decreased coordination, decreased endurance, decreased strength, increased fascial restrictions, increased muscle spasms, and pain.   ACTIVITY LIMITATIONS: toileting  PARTICIPATION LIMITATIONS: meal prep, cleaning, laundry, driving, shopping, and community activity  PERSONAL FACTORS: Time since onset of injury/illness/exacerbation and 1-2 comorbidities: Sarcoidosis; Cholecystectomy; Complex regional pain syndrome  are also affecting patient's functional outcome.   REHAB POTENTIAL: Excellent  CLINICAL DECISION MAKING: Evolving/moderate complexity  EVALUATION COMPLEXITY: Moderate   GOALS: Goals reviewed with patient? Yes  SHORT TERM GOALS: Target date: 11/12/22  Patient independent with fissure care.  Baseline: Goal status: 11/14/22  2.  Patient educated  on vaginal moisturizers to improve pelvic floor health.  Baseline:  Goal status: 11/14/22  3.  Patient able to perform diaphragmatic breathing to relax her pelvic floor for  toileting.  Baseline:  Goal status: Met 10/31/22  4.  Patient understands how to massage around the anus prior to bowel movement.  Baseline:  Goal status: INITIAL   LONG TERM GOALS: Target date: 01/09/23  Patient independent with advanced HEP for core and hip strength.  Baseline:  Goal status: INITIAL  2.  Patient is able to bulge her pelvic floor for correct toileting to assist with decreased strain on her fissure.  Baseline:  Goal status: INITIAL  3.  Patient is able to have a bowel movement with pain decreased >/= 75% due to the ability to relax her pelvic floor.  Baseline:  Goal status: INITIAL  4.  Patient is able to contract her lower abdomen instead of bulge it to correctly push the stool out.  Baseline:  Goal status: INITIAL  5.  CRAIG-7 score is </= 20 compared to 95  Baseline:  Goal status: INITIAL    PLAN:  PT FREQUENCY: 1x/week  PT DURATION: 12 weeks  PLANNED INTERVENTIONS: Therapeutic exercises, Therapeutic activity, Neuromuscular re-education, Patient/Family education, Joint mobilization, Dry Needling, Electrical stimulation, Spinal mobilization, Cryotherapy, Moist heat, Ultrasound, Biofeedback, and Manual therapy  PLAN FOR NEXT SESSION: rib mobility, diaphragmatic breathing, massage around the rectum, see how toileting is going, RUSI to work on pelvic floor contraction, see if need more dry needling.    Eulis Foster, PT 11/26/22 8:47 AM

## 2022-12-03 ENCOUNTER — Ambulatory Visit: Payer: Medicare HMO | Admitting: Physical Therapy

## 2022-12-03 ENCOUNTER — Encounter: Payer: Self-pay | Admitting: Physical Therapy

## 2022-12-03 DIAGNOSIS — R278 Other lack of coordination: Secondary | ICD-10-CM | POA: Diagnosis not present

## 2022-12-03 DIAGNOSIS — K6289 Other specified diseases of anus and rectum: Secondary | ICD-10-CM

## 2022-12-03 DIAGNOSIS — R29898 Other symptoms and signs involving the musculoskeletal system: Secondary | ICD-10-CM

## 2022-12-03 DIAGNOSIS — R252 Cramp and spasm: Secondary | ICD-10-CM

## 2022-12-03 NOTE — Therapy (Signed)
OUTPATIENT PHYSICAL THERAPY FEMALE PELVIC TREATMENT   Patient Name: [name] [name] MRN: [id number] DOB:04-02-1966, 57 y.o., female Today's Date: 12/03/2022  END OF SESSION:  PT End of Session - 12/03/22 0931     Visit Number 6    Date for PT Re-Evaluation 01/09/23    Authorization Type [employer]    Authorization Time Period 10/17/2022-01/09/2023    Authorization - Visit Number 6    Authorization - Number of Visits 12    PT Start Time 0930    PT Stop Time 1010    PT Time Calculation (min) 40 min    Activity Tolerance Patient tolerated treatment well    Behavior During Therapy WFL for tasks assessed/performed             Past Medical History:  Diagnosis Date   Anxiety    Brain fog    Chronic pain    COMPLEX REGIONAL PAIN SYNDROME   Hemorrhoids    History of neck problems    dry needling being done & PT   Leg cramping 2020   right, says sometimes she has a hard time putting pressure on that leg    Migraine headache    Sarcoidosis    Past Surgical History:  Procedure Laterality Date   CHOLECYSTECTOMY     DILATION AND CURETTAGE OF UTERUS     ESOPHAGEAL MANOMETRY N/A 05/05/2018   Procedure: ESOPHAGEAL MANOMETRY (EM);  Surgeon: Vida Rigger, MD;  Location: [facility];  Service: Endoscopy;  Laterality: N/A;   SHOULDER ARTHROSCOPY Left 2008   THORACOSCOPY     TONSILLECTOMY AND ADENOIDECTOMY     AT AGE 18   WISDOM TOOTH EXTRACTION     Patient Active Problem List   Diagnosis Date Noted   Fungal rash of torso 11/23/2022   Pelvic floor dysfunction 10/12/2022   Nasal congestion 06/16/2019   Post-nasal drainage 06/16/2019   Chronic migraine without aura, with intractable migraine, so stated, with status migrainosus 04/30/2019   Globus pharyngeus 04/14/2018   Laryngopharyngeal reflux (LPR) 10/30/2017   Lung nodule 10/30/2017   ANA positive 06/21/2014   Arthralgia 03/22/2014   Fatigue 03/22/2014   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   Radial tunnel syndrome 03/22/2014    Right foot pain 11/04/2012   Migraine with aura 02/06/2011   CONSTIPATION, CHRONIC 07/11/2007   DYSPHAGIA, PHARYNGOESOPHAGEAL PHASE 07/11/2007   NAUSEA WITH VOMITING 06/13/2007   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 I am having pain today. I have taken some relieve. Heating pad helps.     PAIN:  Are you having pain? Yes NPRS scale: 8/10 Pain location:  rectal  Pain type: burning, sharp, and stabbing Pain description: intermittent   Aggravating factors: bowel movements Relieving factors: no bowel movements  PRECAUTIONS: None  WEIGHT BEARING RESTRICTIONS: No  FALLS:  Has patient fallen in last 6 months? No  LIVING ENVIRONMENT: Lives with: lives with their spouse   OCCUPATION: retired  PLOF: Independent  PATIENT GOALS: reduce pain and fissure management  PERTINENT HISTORY:  Sarcoidosis; Cholecystectomy; Complex regional pain syndrome   BOWEL MOVEMENT: Pain with bowel movement: Yes Type of bowel movement:Type (Bristol Stool Scale) Type 1, 4, Frequency 1-2 days, Strain Yes, and Splinting no Fully empty rectum: No Leakage: No Pads: No Fiber supplement: No  URINATION: Pain with urination: No Fully empty bladder: No, after urination she will hold her buttocks to get the rest of the urine out when she has a  bowel movement Stream:  average Urgency: Yes: sometimes Frequency: during day 4 times; night 1 time Leakage:  none Pads: No  INTERCOURSE: Pain with intercourse:  none Ability to have vaginal penetration:  Yes:   Climax: yes Marinoff Scale: 0/3  PREGNANCY: Vaginal deliveries 1 Tearing Yes: had some, episiotomy   PROLAPSE: None   OBJECTIVE:   DIAGNOSTIC FINDINGS:  none  PATIENT SURVEYS:  [id number]  COGNITION: Overall cognitive status: Within functional limits for tasks assessed     SENSATION: Light touch: Appears intact Proprioception: Appears intact   POSTURE: No Significant postural limitations  PELVIC ALIGNMENT: correct alignment  LUMBARAROM/PROM:  A/PROM A/PROM  eval  Flexion Decreased by 25%  Extension Decreased by 25%  Right lateral flexion full  Left lateral flexion full  Right rotation full  Left rotation full   (Blank rows = not tested)  LOWER EXTREMITY ROM: bilateral hip ROM is full   LOWER EXTREMITY MMT:  MMT Right eval Left eval  Hip flexion 3+/5 5/5  Hip extension 4/5 5/5  Hip abduction 3/5 4/5  Hip adduction 4/5 5/5   PALPATION:   General  lower abdominals will bulge with contraction                 External Perineal Exam tenderness located on the right external anal sphincter, along the perineal body, pelvic floor contraction she will engage the gluteal, rectum is cauliflower shape.                              Internal Pelvic Floor not assessed  Patient confirms identification and approves PT to assess internal pelvic floor and treatment Yes  PELVIC MMT:   MMT eval 11/14/22  Vaginal    Internal Anal Sphincter 2/5 2/5 with very little lift  External Anal Sphincter 2/5 2/5 with very little lift  Puborectalis 2/5 2/5 with taping to come forward  Diastasis Recti 1 finger width above and below umbilicus   (Blank rows = not tested)        TONE: Unable to assess due to patient having open areas on her hemorrhoids and the  rectum was very red. Patient had pain with the attempt to place the pinky finger into the canal so therapist did not try. She placed [employer] onto the rectum.   PROLAPSE: none  TODAY'S  TREATMENT:   12/03/22 Manual: Internal pelvic floor techniques: No emotional/communication barriers or cognitive limitation. Patient is motivated to learn. Patient understands and agrees with treatment goals and plan. PT explains patient will be examined in standing, sitting, and lying down to see how their muscles and joints work. When they are ready, they will be asked to remove their underwear so PT can examine their perineum. The patient is also given the option of providing their own chaperone as one is not provided in our facility. The patient also has the right and is explained the right to defer or refuse any part of the evaluation or treatment including the internal exam. With the patient's consent, PT will use one gloved finger to gently assess the muscles of the pelvic floor, seeing how well it contracts and relaxes and if there is muscle symmetry. After, the patient will get dressed and PT and patient will discuss exam findings and plan of care. PT and patient discuss plan of care, schedule, attendance policy and HEP activities.  To assess for dry needling Manual work to the puborectalis, perineal body and superior transverse to elongate after dry needling externally Then placed a hot pack on the area to relax the tissue Trigger Point Dry-Needling  Treatment instructions: Expect mild to moderate muscle soreness. S/S of pneumothorax if dry needled over a lung field, and to seek immediate medical attention should they occur. Patient verbalized understanding of these instructions and education.  Patient Consent Given: Yes Education handout provided: Yes Muscles treated:  perineal body,  puborectalis, superior transverse  Electrical stimulation performed: yes Parameters: Frequency 50 ma,  intensity to patient tolerance Treatment response/outcome: elongation of muscle and trigger point response Exercises: Strengthening: Supine marching with abdominal contraction 15 x each SLR with abdominal contraction 10 x each leg Knee fall out with yellow band 10 x each leg with core engaged Sidely anal contraction 10 x  11/26/22 Manual: Internal pelvic floor techniques: No emotional/communication barriers or cognitive limitation. Patient is motivated to learn. Patient understands and agrees with treatment goals and plan. PT explains patient will be examined in standing, sitting, and lying down to see how their muscles and joints work. When they are ready, they will be asked to remove their underwear so PT can examine their perineum. The patient is also given the option of providing their own chaperone as one is not provided in our facility. The patient also has the right and is explained the right to defer or refuse any part of the evaluation or treatment including the internal exam. With the patient's consent, PT will use one gloved finger to gently assess the muscles of the pelvic floor, seeing how well it contracts and relaxes and if there is muscle symmetry. After, the patient will get dressed and PT and patient will discuss exam findings and plan of care. PT and patient discuss plan of care, schedule, attendance policy and HEP activities.  Fascial release around the rectum externally to improve tissue mobility and reduce pain Trigger Point Dry-Needling  Treatment instructions: Expect mild to moderate muscle soreness. S/S of pneumothorax if dry needled over a lung field, and to seek immediate medical attention should they occur. Patient verbalized understanding of these instructions and education.  Patient Consent Given: Yes Education handout provided: Yes Muscles treated:  perineal body,  puborectalis, Electrical stimulation performed: No Parameters: N/A Treatment response/outcome:  elongation of muscle and trigger point response Exercises: Stretches/mobility: Educated patient about rectal dilators and how they could stretch the rectal tissue  so it is easier to lengthen and no have the pain and not have the rectal tissue prolapse Therapeutic activities: Functional strengthening activities: Educated patient on how to toilet with relaxation, stepping away if the stool does not come out, it is alright to relax and not force the stool to come out.  11/14/22 Manual: Internal pelvic floor techniques: No emotional/communication barriers or cognitive limitation. Patient is motivated to learn. Patient understands and agrees with treatment goals and plan. PT explains patient will be examined in standing, sitting, and lying down to see how their muscles and joints work. When they are ready, they will be asked to remove their underwear so PT can examine their perineum. The patient is also given the option of providing their own chaperone as one is not provided in our facility. The patient also has the right and is explained the right to defer or refuse any part of the evaluation or treatment including the internal exam. With the patient's consent, PT will use one gloved finger to gently assess the muscles of the pelvic floor, seeing how well it contracts and relaxes and if there is muscle symmetry. After, the patient will get dressed and PT and patient will discuss exam findings and plan of care. PT and patient discuss plan of care, schedule, attendance policy and HEP activities.  Going through the rectum using the [facility] working on the puborectalis, superior transverse, iliococcygeus, mobilizing the coccyx with the other hand working externally [employer] Dry-Needling  Treatment instructions: Expect mild to moderate muscle soreness. S/S of pneumothorax if dry needled over a lung field, and to seek immediate medical attention should they occur. Patient verbalized  understanding of these instructions and education.  Patient Consent Given: Yes Education handout provided: Yes Muscles treated:  perineal body, superior transverse, puborectalis, coccygeus Electrical stimulation performed: No Parameters: N/A Treatment response/outcome: elongation of muscle and trigger point response Neuromuscular re-education: Pelvic floor contraction training: Therapist finger in the rectum working on contraction with lift, taping the puborectalis to come forward then work on pushing therapist finger out of the rectum                                                                                                                                  PATIENT EDUCATION: 12/03/22 Education details: Access Code: [email or web address] Person educated: Patient Education method: Programmer, multimedia, Demonstration, Actor cues, Verbal cues, and Handouts Education comprehension: verbalized understanding, returned demonstration, verbal cues required, tactile cues required, and needs further education    HOME EXERCISE PROGRAM: 12/03/22 Access Code: [email or web address] URL: [email or web address] Date: 12/03/2022 Prepared by: Eulis Foster  Exercises - - Bent Knee Fallouts  - 1 x daily - 3 x weekly - 1 sets - 10 reps - Supine March  - 1 x daily - 3 x weekly - 2 sets - 10 reps - Small Range Straight Leg Raise  - 1 x daily - 3 x weekly - 2 sets -  10 reps   ASSESSMENT:  CLINICAL IMPRESSION: Patient is a 57 y.o. female who was seen today for physical therapy  treatment for pelvic floor dysfunction. Patient was able to contract the anus upward for first time. She is learning hip and core exercises. Her rectum protrusion decreased by 75% after the manual work. Patient pain decreased after treatment. She is not having the every bowel movement now.   Patient will benefit from skilled therapy to improve pelvic floor relaxation to have a bowel movement and understand fissure care.   OBJECTIVE IMPAIRMENTS:  decreased activity tolerance, decreased coordination, decreased endurance, decreased strength, increased fascial restrictions, increased muscle spasms, and pain.   ACTIVITY LIMITATIONS: toileting  PARTICIPATION LIMITATIONS: meal prep, cleaning, laundry, driving, shopping, and community activity  PERSONAL FACTORS: Time since onset of injury/illness/exacerbation and 1-2 comorbidities: Sarcoidosis; Cholecystectomy; Complex regional pain syndrome  are also affecting patient's functional outcome.   REHAB POTENTIAL: Excellent  CLINICAL DECISION MAKING: Evolving/moderate complexity  EVALUATION COMPLEXITY: Moderate   GOALS: Goals reviewed with patient? Yes  SHORT TERM GOALS: Target date: 11/12/22  Patient independent with fissure care.  Baseline: Goal status: 11/14/22  2.  Patient educated on vaginal moisturizers to improve pelvic floor health.  Baseline:  Goal status: 11/14/22  3.  Patient able to perform diaphragmatic breathing to relax her pelvic floor for toileting.  Baseline:  Goal status: Met 10/31/22  4.  Patient understands how to massage around the anus prior to bowel movement.  Baseline:  Goal status: ongoing 12/03/22   LONG TERM GOALS: Target date: 01/09/23  Patient independent with advanced HEP for core and hip strength.  Baseline:  Goal status: INITIAL  2.  Patient is able to bulge her pelvic floor for correct toileting to assist with decreased strain on her fissure.  Baseline:  Goal status: INITIAL  3.  Patient is able to have a bowel movement with pain decreased >/= 75% due to the ability to relax her pelvic floor.  Baseline:  Goal status: INITIAL  4.  Patient is able to contract her lower abdomen instead of bulge it to correctly push the stool out.  Baseline:  Goal status: INITIAL  5.  CRAIG-7 score is </= 20 compared to 95  Baseline:  Goal status: INITIAL    PLAN:  PT FREQUENCY: 1x/week  PT DURATION: 12 weeks  PLANNED INTERVENTIONS: Therapeutic  exercises, Therapeutic activity, Neuromuscular re-education, Patient/Family education, Joint mobilization, Dry Needling, Electrical stimulation, Spinal mobilization, Cryotherapy, Moist heat, Ultrasound, Biofeedback, and Manual therapy  PLAN FOR NEXT SESSION: rib mobility, diaphragmatic breathing, massage around the rectum, see how toileting is going, RUSI to work on pelvic floor contraction, see if need more dry needling.    Eulis Foster, PT 12/03/22 10:15 AM

## 2022-12-05 DIAGNOSIS — F5112 Insufficient sleep syndrome: Secondary | ICD-10-CM | POA: Diagnosis not present

## 2022-12-05 DIAGNOSIS — F902 Attention-deficit hyperactivity disorder, combined type: Secondary | ICD-10-CM | POA: Diagnosis not present

## 2022-12-05 DIAGNOSIS — F411 Generalized anxiety disorder: Secondary | ICD-10-CM | POA: Diagnosis not present

## 2022-12-05 DIAGNOSIS — Z79899 Other long term (current) drug therapy: Secondary | ICD-10-CM | POA: Diagnosis not present

## 2022-12-10 ENCOUNTER — Ambulatory Visit: Payer: Medicare HMO | Attending: Family Medicine | Admitting: Physical Therapy

## 2022-12-10 ENCOUNTER — Encounter: Payer: Self-pay | Admitting: Physical Therapy

## 2022-12-10 DIAGNOSIS — R29898 Other symptoms and signs involving the musculoskeletal system: Secondary | ICD-10-CM | POA: Diagnosis not present

## 2022-12-10 DIAGNOSIS — R278 Other lack of coordination: Secondary | ICD-10-CM | POA: Insufficient documentation

## 2022-12-10 DIAGNOSIS — K6289 Other specified diseases of anus and rectum: Secondary | ICD-10-CM | POA: Diagnosis not present

## 2022-12-10 DIAGNOSIS — R252 Cramp and spasm: Secondary | ICD-10-CM | POA: Diagnosis not present

## 2022-12-10 NOTE — Therapy (Signed)
OUTPATIENT PHYSICAL THERAPY FEMALE PELVIC TREATMENT   Patient Name: [name] [name] MRN: [id number] DOB:1965-12-23, 57 y.o., female Today's Date: 12/10/2022  END OF SESSION:  PT End of Session - 12/10/22 1359     Visit Number 7    Date for PT Re-Evaluation 01/09/23    Authorization Type [employer]    Authorization Time Period 10/17/2022-01/09/2023    Authorization - Visit Number 7    Authorization - Number of Visits 12    Progress Note Due on Visit 10    PT Start Time 1400    PT Stop Time 1440    PT Time Calculation (min) 40 min    Activity Tolerance Patient tolerated treatment well    Behavior During Therapy WFL for tasks assessed/performed             Past Medical History:  Diagnosis Date   Anxiety    Brain fog    Chronic pain    COMPLEX REGIONAL PAIN SYNDROME   Hemorrhoids    History of neck problems    dry needling being done & PT   Leg cramping 2020   right, says sometimes she has a hard time putting pressure on that leg    Migraine headache    Sarcoidosis    Past Surgical History:  Procedure Laterality Date   CHOLECYSTECTOMY     DILATION AND CURETTAGE OF UTERUS     ESOPHAGEAL MANOMETRY N/A 05/05/2018   Procedure: ESOPHAGEAL MANOMETRY (EM);  Surgeon: Vida Rigger, MD;  Location: [facility];  Service: Endoscopy;  Laterality: N/A;   SHOULDER ARTHROSCOPY Left 2008   THORACOSCOPY     TONSILLECTOMY AND ADENOIDECTOMY     AT AGE 58   WISDOM TOOTH EXTRACTION     Patient Active Problem List   Diagnosis Date Noted   Fungal rash of torso 11/23/2022   Pelvic floor dysfunction 10/12/2022   Nasal congestion 06/16/2019   Post-nasal drainage 06/16/2019   Chronic migraine without aura, with intractable migraine, so stated, with status migrainosus 04/30/2019   Globus pharyngeus 04/14/2018   Laryngopharyngeal reflux (LPR) 10/30/2017   Lung nodule 10/30/2017   ANA positive 06/21/2014   Arthralgia 03/22/2014   Fatigue 03/22/2014   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    Radial tunnel syndrome 03/22/2014   Right foot pain 11/04/2012   Migraine with aura 02/06/2011   CONSTIPATION, CHRONIC 07/11/2007   DYSPHAGIA, PHARYNGOESOPHAGEAL PHASE 07/11/2007   NAUSEA WITH VOMITING 06/13/2007   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       PAIN:  Are you having pain? Yes NPRS scale: 8/10 Pain location:  rectal  Pain type: burning, sharp, and stabbing Pain description: intermittent   Aggravating factors: bowel movements Relieving factors: no bowel movements  PRECAUTIONS: None  WEIGHT BEARING RESTRICTIONS: No  FALLS:  Has patient fallen in last 6 months? No  LIVING ENVIRONMENT: Lives with: lives with their spouse   OCCUPATION: retired  PLOF: Independent  PATIENT GOALS: reduce pain and fissure management  PERTINENT HISTORY:  Sarcoidosis; Cholecystectomy; Complex regional pain syndrome   BOWEL MOVEMENT: Pain with bowel movement: Yes Type of bowel movement:Type (Bristol Stool Scale) Type 1, 4, Frequency 1-2 days, Strain Yes, and Splinting no Fully empty rectum: No Leakage: No Pads: No Fiber supplement: No  URINATION: Pain with urination: No Fully empty bladder: No, after urination she will hold her buttocks to get the rest of the urine out when she has a bowel  movement Stream:  average Urgency: Yes: sometimes Frequency: during day 4 times; night 1 time Leakage:  none Pads: No  INTERCOURSE: Pain with intercourse:  none Ability to have vaginal penetration:  Yes:   Climax: yes Marinoff Scale: 0/3  PREGNANCY: Vaginal deliveries 1 Tearing Yes: had some, episiotomy   PROLAPSE: None   OBJECTIVE:   DIAGNOSTIC FINDINGS:  none  PATIENT SURVEYS:  [id number] 95  COGNITION: Overall cognitive status: Within functional limits for tasks assessed     SENSATION: Light touch: Appears intact Proprioception: Appears intact   POSTURE: No Significant postural limitations  PELVIC ALIGNMENT: correct alignment  LUMBARAROM/PROM:  A/PROM A/PROM  eval  Flexion Decreased by 25%  Extension Decreased by 25%  Right lateral flexion full  Left lateral flexion full  Right rotation full  Left rotation full   (Blank rows = not tested)  LOWER EXTREMITY ROM: bilateral hip ROM is full   LOWER EXTREMITY MMT:  MMT Right eval Left eval  Hip flexion 3+/5 5/5  Hip extension 4/5 5/5  Hip abduction 3/5 4/5  Hip adduction 4/5 5/5   PALPATION:   General  lower abdominals will bulge with contraction                 External Perineal Exam tenderness located on the right external anal sphincter, along the perineal body, pelvic floor contraction she will engage the gluteal, rectum is cauliflower shape.                              Internal Pelvic Floor not assessed  Patient confirms identification and approves PT to assess internal pelvic floor and treatment Yes  PELVIC MMT:   MMT eval 11/14/22  Vaginal    Internal Anal Sphincter 2/5 2/5 with very little lift  External Anal Sphincter 2/5 2/5 with very little lift  Puborectalis 2/5 2/5 with taping to come forward  Diastasis Recti 1 finger width above and below umbilicus   (Blank rows = not tested)        TONE: Unable to assess due to patient having open areas on her hemorrhoids and the rectum  was very red. Patient had pain with the attempt to place the pinky finger into the canal so therapist did not try. She placed [employer] onto the rectum.   PROLAPSE: none  TODAY'S TREATMENT:   12/10/22 Manual: Internal pelvic floor techniques:  No emotional/communication barriers or cognitive limitation. Patient is motivated to learn. Patient understands and agrees with treatment goals and plan. PT explains patient will be examined in standing, sitting, and lying down to see how their muscles and joints work. When they are ready, they will be asked to remove their underwear so PT can examine their perineum. The patient is also given the option of providing their own chaperone as one is not provided in our facility. The patient also has the right and is explained the right to defer or refuse any part of the evaluation or treatment including the internal exam. With the patient's consent, PT will use one gloved finger to gently assess the muscles of the pelvic floor, seeing how well it contracts and relaxes and if there is muscle symmetry. After, the patient will get dressed and PT and patient will discuss exam findings and plan of care. PT and patient discuss plan of care, schedule, attendance policy and HEP activities.  Going through the rectum working on the puborectalis, anal sphincter and iliococcygeus to lengthen after dry needling Trigger Point Dry-Needling  Treatment instructions: Expect mild to moderate muscle soreness. S/S of pneumothorax if dry needled over a lung field, and to seek immediate medical attention should they occur. Patient verbalized understanding of these instructions and education.  Patient Consent Given: Yes Education handout provided: Yes Muscles treated:  perineal body,  puborectalis, Electrical stimulation performed: No Parameters: N/A Treatment response/outcome: elongation of muscle and trigger point response Neuromuscular re-education: Pelvic floor  contraction training: Therapist finger on the rectum working on pushing the therapist finger out of the rectum with breath in sidely Therapeutic activities: Functional strengthening activities: Educated patient on how to breath while pushing the stool out, opening the rib cage especially the posterior rib cage, knees above the hips.   12/03/22 Manual: Internal pelvic floor techniques: No emotional/communication barriers or cognitive limitation. Patient is motivated to learn. Patient understands and agrees with treatment goals and plan. PT explains patient will be examined in standing, sitting, and lying down to see how their muscles and joints work. When they are ready, they will be asked to remove their underwear so PT can examine their perineum. The patient is also given the option of providing their own chaperone as one is not provided in our facility. The patient also has the right and is explained the right to defer or refuse any part of the evaluation or treatment including the internal exam. With the patient's consent, PT will use one gloved finger to gently assess the muscles of the pelvic floor, seeing how well it contracts and relaxes and if there is muscle symmetry. After, the patient will get dressed and PT and patient will discuss exam findings and plan of care. PT and patient discuss plan of care, schedule, attendance policy and HEP activities.  To assess for dry needling Manual work to the puborectalis, perineal body and superior transverse to elongate after dry needling externally Then placed a hot pack on the area to relax the tissue Trigger Point Dry-Needling  Treatment instructions: Expect mild to moderate muscle soreness. S/S of pneumothorax if dry needled over a lung field, and to seek immediate medical attention should they occur. Patient verbalized understanding of these instructions and education.  Patient Consent Given: Yes Education handout provided: Yes Muscles treated:   perineal body,  puborectalis, superior transverse  Electrical stimulation performed: yes Parameters: Frequency 50 ma, intensity to patient tolerance Treatment response/outcome: elongation of muscle and trigger point response Exercises:  Strengthening: Supine marching with abdominal contraction 15 x each SLR with abdominal contraction 10 x each leg Knee fall out with yellow band 10 x each leg with core engaged Sidely anal contraction 10 x  11/26/22 Manual: Internal pelvic floor techniques: No emotional/communication barriers or cognitive limitation. Patient is motivated to learn. Patient understands and agrees with treatment goals and plan. PT explains patient will be examined in standing, sitting, and lying down to see how their muscles and joints work. When they are ready, they will be asked to remove their underwear so PT can examine their perineum. The patient is also given the option of providing their own chaperone as one is not provided in our facility. The patient also has the right and is explained the right to defer or refuse any part of the evaluation or treatment including the internal exam. With the patient's consent, PT will use one gloved finger to gently assess the muscles of the pelvic floor, seeing how well it contracts and relaxes and if there is muscle symmetry. After, the patient will get dressed and PT and patient will discuss exam findings and plan of care. PT and patient discuss plan of care, schedule, attendance policy and HEP activities.  Fascial release around the rectum externally to improve tissue mobility and reduce pain Trigger Point Dry-Needling  Treatment instructions: Expect mild to moderate muscle soreness. S/S of pneumothorax if dry needled over a lung field, and to seek immediate medical attention should they occur. Patient verbalized understanding of these instructions and education.  Patient Consent Given: Yes Education handout provided: Yes Muscles treated:   perineal body,  puborectalis, Electrical stimulation performed: No Parameters: N/A Treatment response/outcome: elongation of muscle and trigger point response Exercises: Stretches/mobility: Educated patient about rectal dilators and how they could stretch the rectal tissue so it is easier to lengthen and no have the pain and not have the rectal tissue prolapse Therapeutic activities: Functional strengthening activities: Educated patient on how to toilet with relaxation, stepping away if the stool does not come out, it is alright to relax and not force the stool to come out.  Educated patient on different foods that may assist with constipation that she can get over the counter                                                                                                                              PATIENT EDUCATION: 12/10/22 Education details: Access Code: [email or web address], how to toilet correctly Person educated: Patient Education method: Explanation, Demonstration, Tactile cues, Verbal cues, and Handouts Education comprehension: verbalized understanding, returned demonstration, verbal cues required, tactile cues required, and needs further education    HOME EXERCISE PROGRAM: 12/03/22 Access Code: [email or web address] URL: [email or web address] Date: 12/03/2022 Prepared by: Eulis Foster  Exercises - - Bent Knee Fallouts  - 1 x daily - 3 x weekly - 1 sets - 10 reps - Supine March  - 1 x daily - 3 x weekly - 2  sets - 10 reps - Small Range Straight Leg Raise  - 1 x daily - 3 x weekly - 2 sets - 10 reps   ASSESSMENT:  CLINICAL IMPRESSION: Patient is a 57 y.o. female who was seen today for physical therapy  treatment for pelvic floor dysfunction. Patient is taking 1 Alleve per week compared to daily for rectal pain.  Patient was able to push the therapist finger out of the rectum. Patient had improved elongation of the pelvic floor after the manual work.  Patient will benefit from skilled  therapy to improve pelvic floor relaxation to have a bowel movement and understand fissure care.   OBJECTIVE IMPAIRMENTS: decreased activity tolerance, decreased coordination, decreased endurance, decreased strength, increased fascial restrictions, increased muscle spasms, and pain.   ACTIVITY LIMITATIONS: toileting  PARTICIPATION LIMITATIONS: meal prep, cleaning, laundry, driving, shopping, and community activity  PERSONAL FACTORS: Time since onset of injury/illness/exacerbation and 1-2 comorbidities: Sarcoidosis; Cholecystectomy; Complex regional pain syndrome  are also affecting patient's functional outcome.   REHAB POTENTIAL: Excellent  CLINICAL DECISION MAKING: Evolving/moderate complexity  EVALUATION COMPLEXITY: Moderate   GOALS: Goals reviewed with patient? Yes  SHORT TERM GOALS: Target date: 11/12/22  Patient independent with fissure care.  Baseline: Goal status: Met 11/14/22  2.  Patient educated on vaginal moisturizers to improve pelvic floor health.  Baseline:  Goal status: Met 11/14/22  3.  Patient able to perform diaphragmatic breathing to relax her pelvic floor for toileting.  Baseline:  Goal status: Met 10/31/22  4.  Patient understands how to massage around the anus prior to bowel movement.  Baseline:  Goal status: Met 12/10/22   LONG TERM GOALS: Target date: 01/09/23  Patient independent with advanced HEP for core and hip strength.  Baseline:  Goal status: INITIAL  2.  Patient is able to bulge her pelvic floor for correct toileting to assist with decreased strain on her fissure.  Baseline:  Goal status: Met 12/10/22  3.  Patient is able to have a bowel movement with pain decreased >/= 75% due to the ability to relax her pelvic floor.  Baseline:  Goal status: INITIAL  4.  Patient is able to contract her lower abdomen instead of bulge it to correctly push the stool out.  Baseline:  Goal status: INITIAL  5.  CRAIG-7 score is </= 20 compared to 95  Baseline:   Goal status: INITIAL    PLAN:  PT FREQUENCY: 1x/week  PT DURATION: 12 weeks  PLANNED INTERVENTIONS: Therapeutic exercises, Therapeutic activity, Neuromuscular re-education, Patient/Family education, Joint mobilization, Dry Needling, Electrical stimulation, Spinal mobilization, Cryotherapy, Moist heat, Ultrasound, Biofeedback, and Manual therapy  PLAN FOR NEXT SESSION: ,dry needling,  see how toileting is going, RUSI to work on pelvic floor contraction, see if need more dry needling.    Eulis Foster, PT 12/10/22 2:42 PM

## 2022-12-10 NOTE — Patient Instructions (Signed)
How To Poop Better: ° °What are Good Poops? °There is no one exact normal, but they should be REGULAR.  This varies from person to person and ranges from up to 3x/day or as little as 3-4/week.  This should stay consistent for you.  They should be formed and ideally one solid mass that doesn't fall apart or dissolve in the water and is brown in color. ° °Lifestyle Tips: ° °Fiber: Eat 25-31 grams per day °Do not hold it.  If you need to go, GO! °Try to go every day around the same time °Walk and move more °Probiotics for more healthy gut bacteria °Water and fluids: half of your healthy body weight in ounces ° °Toileting Tips: ° °Posture: knees above hips, back flat, look straight ahead, RELAX °Relax all the muscles from your face down to your toes °Breathe: slow deep breaths into your belly and pelvic floor is RELAXED °Blow: Tighten belly and blow like blowing up a balloon, make “SH” sound, make a vowel sound with a deep voice °Do NOT sit more than 10 minutes °After you are finished, tighten the muscles to reset pelvic floor back to normal °

## 2022-12-17 ENCOUNTER — Encounter: Payer: Self-pay | Admitting: Physical Therapy

## 2022-12-17 ENCOUNTER — Ambulatory Visit: Payer: Medicare HMO | Admitting: Physical Therapy

## 2022-12-17 DIAGNOSIS — R252 Cramp and spasm: Secondary | ICD-10-CM | POA: Diagnosis not present

## 2022-12-17 DIAGNOSIS — R278 Other lack of coordination: Secondary | ICD-10-CM | POA: Diagnosis not present

## 2022-12-17 DIAGNOSIS — K6289 Other specified diseases of anus and rectum: Secondary | ICD-10-CM

## 2022-12-17 DIAGNOSIS — R29898 Other symptoms and signs involving the musculoskeletal system: Secondary | ICD-10-CM | POA: Diagnosis not present

## 2022-12-17 NOTE — Therapy (Signed)
OUTPATIENT PHYSICAL THERAPY FEMALE PELVIC TREATMENT   Patient Name: [name] [name] MRN: [id number] DOB:[birth year], 57 y.o., female Today's Date: 12/17/2022  END OF SESSION:  PT End of Session - 12/17/22 0933     Visit Number 8    Date for PT Re-Evaluation 01/09/23    Authorization Type [employer]    Authorization Time Period 10/17/2022-01/09/2023    Authorization - Visit Number 8    Authorization - Number of Visits 12    Progress Note Due on Visit 10    PT Start Time 0930    PT Stop Time 1010    PT Time Calculation (min) 40 min    Activity Tolerance Patient tolerated treatment well    Behavior During Therapy WFL for tasks assessed/performed             Past Medical History:  Diagnosis Date   Anxiety    Brain fog    Chronic pain    COMPLEX REGIONAL PAIN SYNDROME   Hemorrhoids    History of neck problems    dry needling being done & PT   Leg cramping 2020   right, says sometimes she has a hard time putting pressure on that leg    Migraine headache    Sarcoidosis    Past Surgical History:  Procedure Laterality Date   CHOLECYSTECTOMY     DILATION AND CURETTAGE OF UTERUS     ESOPHAGEAL MANOMETRY N/A 05/05/2018   Procedure: ESOPHAGEAL MANOMETRY (EM);  Surgeon: Vida Rigger, MD;  Location: [facility];  Service: Endoscopy;  Laterality: N/A;   SHOULDER ARTHROSCOPY Left 2008   THORACOSCOPY     TONSILLECTOMY AND ADENOIDECTOMY     AT AGE 40   WISDOM TOOTH EXTRACTION     Patient Active Problem List   Diagnosis Date Noted   Fungal rash of torso 11/23/2022   Pelvic floor dysfunction 10/12/2022   Nasal congestion 06/16/2019   Post-nasal drainage 06/16/2019   Chronic migraine without aura, with intractable migraine, so stated, with status migrainosus 04/30/2019   Globus pharyngeus 04/14/2018   Laryngopharyngeal reflux (LPR) 10/30/2017   Lung nodule 10/30/2017   ANA positive 06/21/2014   Arthralgia 03/22/2014   Fatigue 03/22/2014   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    Radial tunnel syndrome 03/22/2014   Right foot pain 11/04/2012   Migraine with aura 02/06/2011   CONSTIPATION, CHRONIC 07/11/2007   DYSPHAGIA, PHARYNGOESOPHAGEAL PHASE 07/11/2007   NAUSEA WITH VOMITING 06/13/2007   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 The rectal pain is doing much better. One day a week I will take Alleve for the Rectal pain. I still have rectal bleeding.     PAIN:  Are you having pain? Yes NPRS scale: 8/10 Pain location:  rectal  Pain type: burning, sharp, and stabbing Pain description: intermittent   Aggravating factors: bowel movements Relieving factors: no bowel movements  PRECAUTIONS: None  WEIGHT BEARING RESTRICTIONS: No  FALLS:  Has patient fallen in last 6 months? No  LIVING ENVIRONMENT: Lives with: lives with their spouse   OCCUPATION: retired  PLOF: Independent  PATIENT GOALS: reduce pain and fissure management  PERTINENT HISTORY:  Sarcoidosis; Cholecystectomy; Complex regional pain syndrome   BOWEL MOVEMENT: Pain with bowel movement: Yes Type of bowel movement:Type (Bristol Stool Scale) Type 1, 4, Frequency 1-2 days, Strain Yes, and Splinting no Fully empty rectum: No Leakage: No Pads: No Fiber supplement: No  URINATION: Pain with urination: No Fully empty bladder: No,  after urination she will hold her buttocks to get the rest of the urine out when she has a bowel movement Stream:  average Urgency: Yes: sometimes Frequency: during day 4 times; night 1 time Leakage:  none Pads: No  INTERCOURSE: Pain with intercourse:  none Ability to have vaginal penetration:  Yes:   Climax: yes Marinoff Scale: 0/3  PREGNANCY: Vaginal deliveries 1 Tearing Yes: had some, episiotomy   PROLAPSE: None   OBJECTIVE:   DIAGNOSTIC FINDINGS:  none  PATIENT SURVEYS:  CRAIQ-7 95  COGNITION: Overall cognitive status: Within functional limits for tasks assessed     SENSATION: Light touch: Appears intact Proprioception: Appears intact   POSTURE: No Significant postural limitations  PELVIC ALIGNMENT: correct alignment  LUMBARAROM/PROM:  A/PROM A/PROM  eval  Flexion Decreased by 25%  Extension Decreased by 25%  Right lateral flexion full  Left lateral flexion full  Right rotation full  Left rotation full   (Blank rows = not tested)  LOWER EXTREMITY ROM: bilateral hip ROM is full   LOWER EXTREMITY MMT:  MMT Right eval Left eval  Hip flexion 3+/5 5/5  Hip extension 4/5 5/5  Hip abduction 3/5 4/5  Hip adduction 4/5 5/5   PALPATION:   General  lower abdominals will bulge with contraction                 External Perineal Exam tenderness located on the right external anal sphincter, along the perineal body, pelvic floor contraction she will engage the gluteal, rectum is cauliflower shape.                              Internal Pelvic Floor not assessed  Patient confirms identification and approves PT to assess internal pelvic floor and treatment Yes  PELVIC MMT:   MMT eval 11/14/22  Vaginal    Internal Anal Sphincter 2/5 2/5 with very little lift  External Anal Sphincter 2/5 2/5 with very little lift  Puborectalis 2/5 2/5 with taping to come forward  Diastasis Recti 1 finger width above and below umbilicus   (Blank rows = not tested)         TONE: Unable to assess due to patient having open areas on her hemorrhoids and the rectum was very red. Patient had pain with the attempt to place the pinky finger into the canal so therapist did not try. She placed [facility]  Harvest Reveleum onto the rectum.   PROLAPSE: none  TODAY'S TREATMENT:   12/17/22 Manual: Soft tissue mobilization: Manual work to the puborectalis, external anal sphincter, perineal body to elongate after dry needling and assess for dry needling Trigger Point Dry-Needling  Treatment instructions: Expect mild to moderate muscle soreness. S/S of pneumothorax if dry needled over a lung field, and to seek immediate medical attention should they occur. Patient verbalized understanding of these instructions and education.  Patient Consent Given: Yes Education handout provided: Yes Muscles treated:  perineal body,  puborectalis, Electrical stimulation performed: No Parameters: N/A Treatment response/outcome: elongation of muscle and trigger point response Neuromuscular re-education: Pelvic floor contraction training: Using the RUSI along the perineal area on pelvic floor setting to work on anal contraction without bearing down with coughing, laughing, marching and SLR.   12/10/22 Manual: Internal pelvic floor techniques: No emotional/communication barriers or cognitive limitation. Patient is motivated to learn. Patient understands and agrees with treatment goals and plan. PT explains patient will be examined in standing, sitting, and lying down to see how their muscles and joints work. When they are ready, they will be asked to remove their underwear so PT can examine their perineum. The patient is also given the option of providing their own chaperone as one is not provided in our facility. The patient also has the right and is explained the right to defer or refuse any part of the evaluation or treatment including the internal exam. With the patient's consent, PT will use  one gloved finger to gently assess the muscles of the pelvic floor, seeing how well it contracts and relaxes and if there is muscle symmetry. After, the patient will get dressed and PT and patient will discuss exam findings and plan of care. PT and patient discuss plan of care, schedule, attendance policy and HEP activities.  Going through the rectum working on the puborectalis, anal sphincter and iliococcygeus to lengthen after dry needling Trigger Point Dry-Needling  Treatment instructions: Expect mild to moderate muscle soreness. S/S of pneumothorax if dry needled over a lung field, and to seek immediate medical attention should they occur. Patient verbalized understanding of these instructions and education.  Patient Consent Given: Yes Education handout provided: Yes Muscles treated:  perineal body,  puborectalis, Electrical stimulation performed: No Parameters: N/A Treatment response/outcome: elongation of muscle and trigger point response Neuromuscular re-education: Pelvic floor contraction training: Therapist finger on the rectum working on pushing the therapist finger out of the rectum with breath in sidely Therapeutic activities: Functional strengthening activities: Educated patient on how to breath while pushing the stool out, opening the rib cage especially the posterior rib cage, knees above the hips.   12/03/22 Manual: Internal pelvic floor techniques: No emotional/communication barriers or cognitive limitation. Patient is motivated to learn. Patient understands and agrees with treatment goals and plan. PT explains patient will be examined in standing, sitting, and lying down to see how their muscles and joints work. When they are ready, they will be asked to remove their underwear so PT can examine their perineum. The patient is also given the option of providing their own chaperone as one is not provided in our facility. The patient also has the right and is explained the right to  defer or refuse any part of the evaluation or treatment including the internal exam. With the patient's consent, PT will use one gloved finger to gently assess the muscles of the pelvic floor, seeing how well it contracts and relaxes and if there  is muscle symmetry. After, the patient will get dressed and PT and patient will discuss exam findings and plan of care. PT and patient discuss plan of care, schedule, attendance policy and HEP activities.  To assess for dry needling Manual work to the puborectalis, perineal body and superior transverse to elongate after dry needling externally Then placed a hot pack on the area to relax the tissue Trigger Point Dry-Needling  Treatment instructions: Expect mild to moderate muscle soreness. S/S of pneumothorax if dry needled over a lung field, and to seek immediate medical attention should they occur. Patient verbalized understanding of these instructions and education.  Patient Consent Given: Yes Education handout provided: Yes Muscles treated:  perineal body,  puborectalis, superior transverse  Electrical stimulation performed: yes Parameters: Frequency 50 ma, intensity to patient tolerance Treatment response/outcome: elongation of muscle and trigger point response Exercises: Strengthening: Supine marching with abdominal contraction 15 x each SLR with abdominal contraction 10 x each leg Knee fall out with yellow band 10 x each leg with core engaged Sidely anal contraction 10 x                                                                                                                               PATIENT EDUCATION: 12/10/22 Education details: Access Code: [email or web address], how to toilet correctly Person educated: Patient Education method: Explanation, Demonstration, Tactile cues, Verbal cues, and Handouts Education comprehension: verbalized understanding, returned demonstration, verbal cues required, tactile cues required, and needs further  education    HOME EXERCISE PROGRAM: 12/03/22 Access Code: [email or web address] URL: [email or web address] Date: 12/03/2022 Prepared by: Eulis Foster  Exercises - - Bent Knee Fallouts  - 1 x daily - 3 x weekly - 1 sets - 10 reps - Supine March  - 1 x daily - 3 x weekly - 2 sets - 10 reps - Small Range Straight Leg Raise  - 1 x daily - 3 x weekly - 2 sets - 10 reps   ASSESSMENT:  CLINICAL IMPRESSION: Patient is a 57 y.o. female who was seen today for physical therapy  treatment for pelvic floor dysfunction. Patient is taking 1 Alleve per week compared to daily for rectal pain.  Rectal pain is 80% better at this time. Patient will bear down when she laughs and has difficulty with contraction. She will push the prolapse out further with her bearing down. The rectum had irritated area so no internal work was done.  Patient will benefit from skilled therapy to improve pelvic floor relaxation to have a bowel movement and understand fissure care.   OBJECTIVE IMPAIRMENTS: decreased activity tolerance, decreased coordination, decreased endurance, decreased strength, increased fascial restrictions, increased muscle spasms, and pain.   ACTIVITY LIMITATIONS: toileting  PARTICIPATION LIMITATIONS: meal prep, cleaning, laundry, driving, shopping, and community activity  PERSONAL FACTORS: Time since onset of injury/illness/exacerbation and 1-2 comorbidities: Sarcoidosis; Cholecystectomy; Complex regional pain syndrome  are also affecting patient's functional outcome.   REHAB POTENTIAL: Excellent  CLINICAL DECISION MAKING: Evolving/moderate complexity  EVALUATION COMPLEXITY: Moderate   GOALS: Goals reviewed with patient? Yes  SHORT TERM GOALS: Target date: 11/12/22  Patient independent with fissure care.  Baseline: Goal status: Met 11/14/22  2.  Patient educated on vaginal moisturizers to improve pelvic floor health.  Baseline:  Goal status: Met 11/14/22  3.  Patient able to perform  diaphragmatic breathing to relax her pelvic floor for toileting.  Baseline:  Goal status: Met 10/31/22  4.  Patient understands how to massage around the anus prior to bowel movement.  Baseline:  Goal status: Met 12/10/22   LONG TERM GOALS: Target date: 01/09/23  Patient independent with advanced HEP for core and hip strength.  Baseline:  Goal status: INITIAL  2.  Patient is able to bulge her pelvic floor for correct toileting to assist with decreased strain on her fissure.  Baseline:  Goal status: Met 12/10/22  3.  Patient is able to have a bowel movement with pain decreased >/= 75% due to the ability to relax her pelvic floor.  Baseline:  Goal status: INITIAL  4.  Patient is able to contract her lower abdomen instead of bulge it to correctly push the stool out.  Baseline:  Goal status: INITIAL  5.  CRAIG-7 score is </= 20 compared to 95  Baseline:  Goal status: INITIAL    PLAN:  PT FREQUENCY: 1x/week  PT DURATION: 12 weeks  PLANNED INTERVENTIONS: Therapeutic exercises, Therapeutic activity, Neuromuscular re-education, Patient/Family education, Joint mobilization, Dry Needling, Electrical stimulation, Spinal mobilization, Cryotherapy, Moist heat, Ultrasound, Biofeedback, and Manual therapy  PLAN FOR NEXT SESSION: see how toileting is going, RUSI to work on pelvic floor contraction, see if need more dry needling.    Eulis Foster, PT 12/17/22 10:16 AM

## 2022-12-18 ENCOUNTER — Ambulatory Visit: Payer: Medicare HMO | Admitting: Neurology

## 2022-12-20 ENCOUNTER — Ambulatory Visit: Payer: Medicare HMO | Admitting: Adult Health

## 2022-12-20 DIAGNOSIS — G43711 Chronic migraine without aura, intractable, with status migrainosus: Secondary | ICD-10-CM | POA: Diagnosis not present

## 2022-12-20 MED ORDER — ONABOTULINUMTOXINA 200 UNITS IJ SOLR
155.0000 [IU] | Freq: Once | INTRAMUSCULAR | Status: AC
Start: 2022-12-20 — End: 2022-12-20
  Administered 2022-12-20: 158 [IU] via INTRAMUSCULAR

## 2022-12-20 NOTE — Progress Notes (Signed)
Botox- 200 units x 1 vial Lot: [id number] Expiration: 01/2025 NDC: 4540-9811-91  Bacteriostatic 0.9% Sodium Chloride- 4mL total Lot: [id number] Expiration: 04/11/2024 NDC: 6213-0865-78  Dx: G43.711 BB  Witnessed by: Leeann Must

## 2022-12-20 NOTE — Progress Notes (Signed)
12/20/22: Botox working well. Continues to have neck pain. Seeing sports medicine and doing dry needling. Hurts more on the right than left.   09/18/2022: still doing well > 70% improvement in migraine freq and severity and headache as well. Tightness in neck, tried muscle relaxers, conservative measures, dryneedling: send to dr Katrinka Blazing and see if he can help.     06/19/2022: botox working well. Reports neck stiffnes and should tightness left > right   BOTOX PROCEDURE NOTE FOR MIGRAINE HEADACHE    Contraindications and precautions discussed with patient(above). Aseptic procedure was observed and patient tolerated procedure. Procedure performed by Butch Penny, NP  The condition has existed for more than 6 months, and pt does not have a diagnosis of ALS, Myasthenia Gravis or Lambert-Eaton Syndrome.  Risks and benefits of injections discussed and pt agrees to proceed with the procedure.  Written consent obtained  These injections are medically necessary. These injections do not cause sedations or hallucinations which the oral therapies may cause.  Indication/Diagnosis: chronic migraine BOTOX([id number]) injection was performed according to protocol by [employer]. 200 units of BOTOX was dissolved into 4 cc NS.   NDC: [address]-86  Type of toxin: Botox Botox- 100 units x 2 vials Lot: [id number] Expiration: 08/2024 NDC: 9528-4132-44   Bacteriostatic 0.9% Sodium Chloride- 4mL total Lot: [id number]     Expiration: 02/10/2023 NDC: 5366-4403-47   Dx: Q25.956     Description of procedure:  The patient was placed in a sitting position. The standard protocol was used for Botox as follows, with 5 units of Botox injected at each site:   -Procerus muscle, midline injection  -Corrugator muscle, bilateral injection  -Frontalis muscle, bilateral injection, with 2 sites each side, medial injection was performed in the upper one third of the frontalis muscle, in the region vertical from the medial  inferior edge of the superior orbital rim. The lateral injection was again in the upper one third of the forehead vertically above the lateral limbus of the cornea, 1.5 cm lateral to the medial injection site.  -Temporalis muscle injection, 4 sites, bilaterally. The first injection was 3 cm above the tragus of the ear, second injection site was 1.5 cm to 3 cm up from the first injection site in line with the tragus of the ear. The third injection site was 1.5-3 cm forward between the first 2 injection sites. The fourth injection site was 1.5 cm posterior to the second injection site.  -Occipitalis muscle injection, 3 sites, bilaterally. The first injection was done one half way between the occipital protuberance and the tip of the mastoid process behind the ear. The second injection site was done lateral and superior to the first, 1 fingerbreadth from the first injection. The third injection site was 1 fingerbreadth superiorly and medially from the first injection site.  -Cervical paraspinal muscle injection, 2 sites, bilateral knee first injection site was 1 cm from the midline of the cervical spine, 3 cm inferior to the lower border of the occipital protuberance. The second injection site was 1.5 cm superiorly and laterally to the first injection site.  -Trapezius muscle injection was performed at 3 sites, bilaterally. The first injection site was in the upper trapezius muscle halfway between the inflection point of the neck, and the acromion. The second injection site was one half way between the acromion and the first injection site. The third injection was done between the first injection site and the inflection point of the neck. One injection below the third injection (  3 units)   Will return for repeat injection in 3 months.   A 200 units of Botox was used, 158 units were injected, the rest of the Botox was wasted. The patient tolerated the procedure well, there were no complications of the  above procedure.  Butch Penny, MSN, NP-C 12/20/2022, 9:31 AM [facility] [address] [place], [place] [address] [phone]

## 2022-12-24 ENCOUNTER — Encounter: Payer: Medicare HMO | Admitting: Physical Therapy

## 2022-12-27 DIAGNOSIS — Z0100 Encounter for examination of eyes and vision without abnormal findings: Secondary | ICD-10-CM | POA: Diagnosis not present

## 2023-01-04 ENCOUNTER — Encounter: Payer: Self-pay | Admitting: Family Medicine

## 2023-01-07 ENCOUNTER — Ambulatory Visit: Payer: Medicare HMO | Admitting: Physical Therapy

## 2023-01-07 ENCOUNTER — Other Ambulatory Visit: Payer: Self-pay

## 2023-01-07 DIAGNOSIS — M25571 Pain in right ankle and joints of right foot: Secondary | ICD-10-CM

## 2023-01-08 ENCOUNTER — Ambulatory Visit (INDEPENDENT_AMBULATORY_CARE_PROVIDER_SITE_OTHER): Payer: Medicare HMO

## 2023-01-08 DIAGNOSIS — M25571 Pain in right ankle and joints of right foot: Secondary | ICD-10-CM | POA: Diagnosis not present

## 2023-01-22 NOTE — Progress Notes (Unsigned)
[name] [address] [place] [address] Phone: [phone] Subjective:   [name] [name], am serving as a scribe for Dr. Antoine [name].  I'm seeing this patient by the request  of:  Tisovec, [name] Koh, MD  CC: Headache follow-up  IEP:[id number]  11/23/2022 Patient is already doing better with her headache still at this time.  We discussed with patient other treatment options which include different medications but feels like she has made significant improvement.  Does not want to make any other than large changes at the moment.  Discussed about medications patient would know what options we have that were still available.  Responding well to osteopathic manipulation.  We did discuss laboratory workup showing low ferritin and using vitamin C but not wanting to do true iron supplementation secondary to her GI or underlying problems.  Follow-up with me again in 2 months     Diflucan given for this problem.  Worsening will need to send to dermatology for biopsy.      Update 01/23/2023 [name] [name] is a 57 y.o. female coming in with complaint of cervical spine pain.  Patient was found to have some iron deficiency and was going to be following up with gastroenterology.  Has been going to physical therapy as well as manual massage.  Patient since last visit also did have Botox injections.  In the interim patient did have right ankle injury and asked to have x-rays.  We did order them and independently visualized by me showing no significant bony abnormality.  Patient states that her neck is doing better. Had Botox in the trap and neck as well as dry needling which have been helpful. Does still have pain but it is much better.    Patient did have a rectal prolapse and does have a picture.  Wants to know who she should be referred to      Past Medical History:  Diagnosis Date   Anxiety    Brain fog    Chronic pain    COMPLEX REGIONAL PAIN SYNDROME    Hemorrhoids    History of neck problems    dry needling being done & PT   Leg cramping 2020   right, says sometimes she has a hard time putting pressure on that leg    Migraine headache    Sarcoidosis    Past Surgical History:  Procedure Laterality Date   CHOLECYSTECTOMY     DILATION AND CURETTAGE OF UTERUS     ESOPHAGEAL MANOMETRY N/A 05/05/2018   Procedure: ESOPHAGEAL MANOMETRY (EM);  Surgeon: Vida Rigger, MD;  Location: [facility];  Service: Endoscopy;  Laterality: N/A;   SHOULDER ARTHROSCOPY Left 2008   THORACOSCOPY     TONSILLECTOMY AND ADENOIDECTOMY     AT AGE 59   WISDOM TOOTH EXTRACTION     Social History   Socioeconomic History   Marital status: Married    Spouse name: Not on file   Number of children: Not on file   Years of education: Not on file   Highest education level: Master's degree (e.g., MA, MS, MEng, MEd, MSW, MBA)  Occupational History   Not on file  Tobacco Use   Smoking status: Never   Smokeless tobacco: Never  Vaping Use   Vaping status: Never Used  Substance and Sexual Activity   Alcohol use: Not Currently    Alcohol/week: 1.0 standard drink of alcohol    Types: 1 Standard drinks or equivalent per week  Drug use: No   Sexual activity: Yes    Partners: Male    Birth control/protection: Pill  Other Topics Concern   Not on file  Social History Narrative   Lives at home with husband and daughter   Right handed   Caffeine: 1 cup/day   Social Determinants of Health   Financial Resource Strain: Not on file  Food Insecurity: Not on file  Transportation Needs: Not on file  Physical Activity: Not on file  Stress: Not on file  Social Connections: Not on file   Allergies  Allergen Reactions   Levaquin [Levofloxacin In D5w]     Severe rash   Metoclopramide Hcl    Penicillins     GI upset   Reglan [Metoclopramide] Anxiety   Family History  Problem Relation Age of Onset   Diabetes Mother        TYPE 2   Hypertension Mother     Cancer Mother        UTERINE CANCER   Heart disease Father        HEART ATTACK   Migraines Father     Current Outpatient Medications (Endocrine & Metabolic):    progesterone (PROMETRIUM) 200 MG capsule, Take 200 mg by mouth daily.   Current Outpatient Medications (Respiratory):    Cetirizine HCl (ZYRTEC PO), Take 10 mg by mouth daily as needed.   fluticasone (FLONASE) 50 MCG/ACT nasal spray, Place 2 sprays into both nostrils as needed.  Current Outpatient Medications (Analgesics):    HYDROcodone-acetaminophen (NORCO) 5-325 MG tablet, Take 1-2 tablets by mouth every 6 (six) hours as needed for moderate pain.   rizatriptan (MAXALT-MLT) 10 MG disintegrating tablet, Take 1 tablet (10 mg total) by mouth as needed for migraine. May repeat in 2 hours if needed   Current Outpatient Medications (Other):    ALPRAZolam (XANAX) 0.5 MG tablet, Take 0.5 mg by mouth at bedtime as needed for anxiety.   botulinum toxin Type A (BOTOX) 100 units SOLR injection, Provider to inject 155 units into the muscles of the head and neck every 3 months. Discard remainder.   Cholecalciferol (VITAMIN D3 PO), Take 5,000 Int'l Units/L by mouth daily. Patient state   DYANAVEL XR 20 MG CHER, Take 1 tablet by mouth daily.   fluconazole (DIFLUCAN) 150 MG tablet, Take 1 tablet (150 mg total) by mouth daily.   PARoxetine (PAXIL) 30 MG tablet, Take 20 mg by mouth daily.   TRAZODONE HCL PO, Take 100 mg by mouth at bedtime.   UNABLE TO FIND, Biotepellet  inserted every 3 months for hormone replacement   Reviewed prior external information including notes and imaging from  primary care provider As well as notes that were available from care everywhere and other healthcare systems.  Past medical history, social, surgical and family history all reviewed in electronic medical record.  No pertanent information unless stated regarding to the chief complaint.   Review of Systems:  No , visual changes, nausea, vomiting,  diarrhea, constipation, dizziness, abdominal pain, skin rash, fevers, chills, night sweats, weight loss, swollen lymph nodes, body aches, joint swelling, chest pain, shortness of breath, mood changes. POSITIVE muscle aches, headache  Objective  Blood pressure 102/78, pulse 73, height 5\' 1"  (1.549 m), weight 106 lb (48.1 kg), last menstrual period 01/05/2011, SpO2 98%.   General: No apparent distress alert and oriented x3 mood and affect normal, dressed appropriately.  HEENT: Pupils equal, extraocular movements intact  Respiratory: Patient's speak in full sentences and does not appear short  of breath  Cardiovascular: No lower extremity edema, non tender, no erythema  Patient is back still has significant tightness noted. Weakness noted of the scapular areas bilaterally.  Right foot still has some tenderness on the anterior aspect of the foot.  Seems to be even to light palpation.  ATFL though does appear to be intact.  Osteopathic findings C2 flexed rotated and side bent right C4 flexed rotated and side bent left C6 flexed rotated and side bent left T3 extended rotated and side bent left inhaled third rib   Impression and Recommendations:     The above documentation has been reviewed and is accurate and complete Judi Saa, DO

## 2023-01-23 ENCOUNTER — Encounter: Payer: Self-pay | Admitting: Physical Therapy

## 2023-01-23 ENCOUNTER — Ambulatory Visit: Payer: Medicare HMO | Attending: Family Medicine | Admitting: Physical Therapy

## 2023-01-23 ENCOUNTER — Other Ambulatory Visit: Payer: Self-pay | Admitting: Family Medicine

## 2023-01-23 ENCOUNTER — Ambulatory Visit: Payer: Medicare HMO | Admitting: Family Medicine

## 2023-01-23 ENCOUNTER — Encounter: Payer: Self-pay | Admitting: Family Medicine

## 2023-01-23 VITALS — BP 102/78 | HR 73 | Ht 61.0 in | Wt 106.0 lb

## 2023-01-23 DIAGNOSIS — R278 Other lack of coordination: Secondary | ICD-10-CM | POA: Diagnosis not present

## 2023-01-23 DIAGNOSIS — R252 Cramp and spasm: Secondary | ICD-10-CM | POA: Diagnosis not present

## 2023-01-23 DIAGNOSIS — K6289 Other specified diseases of anus and rectum: Secondary | ICD-10-CM | POA: Insufficient documentation

## 2023-01-23 DIAGNOSIS — M9902 Segmental and somatic dysfunction of thoracic region: Secondary | ICD-10-CM | POA: Diagnosis not present

## 2023-01-23 DIAGNOSIS — M2550 Pain in unspecified joint: Secondary | ICD-10-CM | POA: Diagnosis not present

## 2023-01-23 DIAGNOSIS — K623 Rectal prolapse: Secondary | ICD-10-CM

## 2023-01-23 DIAGNOSIS — M9901 Segmental and somatic dysfunction of cervical region: Secondary | ICD-10-CM

## 2023-01-23 DIAGNOSIS — G43711 Chronic migraine without aura, intractable, with status migrainosus: Secondary | ICD-10-CM

## 2023-01-23 DIAGNOSIS — M9908 Segmental and somatic dysfunction of rib cage: Secondary | ICD-10-CM

## 2023-01-23 DIAGNOSIS — R29898 Other symptoms and signs involving the musculoskeletal system: Secondary | ICD-10-CM | POA: Diagnosis not present

## 2023-01-23 LAB — COMPREHENSIVE METABOLIC PANEL
ALT: 13 U/L (ref 0–35)
AST: 19 U/L (ref 0–37)
Albumin: 4.5 g/dL (ref 3.5–5.2)
Alkaline Phosphatase: 39 U/L (ref 39–117)
BUN: 14 mg/dL (ref 6–23)
CO2: 31 mEq/L (ref 19–32)
Calcium: 9.5 mg/dL (ref 8.4–10.5)
Chloride: 99 mEq/L (ref 96–112)
Creatinine, Ser: 0.73 mg/dL (ref 0.40–1.20)
GFR: 91.76 mL/min (ref >60.00)
Glucose, Bld: 88 mg/dL (ref 70–99)
Potassium: 4.2 mEq/L (ref 3.5–5.1)
Sodium: 136 mEq/L (ref 135–145)
Total Bilirubin: 0.4 mg/dL (ref 0.2–1.2)
Total Protein: 7 g/dL (ref 6.0–8.3)

## 2023-01-23 LAB — CBC WITH DIFFERENTIAL/PLATELET
Basophils Absolute: 0 10*3/uL (ref 0.0–0.1)
Basophils Relative: 0.7 % (ref 0.0–3.0)
Eosinophils Absolute: 0.2 10*3/uL (ref 0.0–0.7)
Eosinophils Relative: 3.5 % (ref 0.0–5.0)
HCT: 42.3 % (ref 36.0–46.0)
Hemoglobin: 13.6 g/dL (ref 12.0–15.0)
Lymphocytes Relative: 23.4 % (ref 12.0–46.0)
Lymphs Abs: 1.4 10*3/uL (ref 0.7–4.0)
MCHC: 32 g/dL (ref 30.0–36.0)
MCV: 90.1 fl (ref 78.0–100.0)
Monocytes Absolute: 0.6 10*3/uL (ref 0.1–1.0)
Monocytes Relative: 9.1 % (ref 3.0–12.0)
Neutro Abs: 3.8 10*3/uL (ref 1.4–7.7)
Neutrophils Relative %: 63.3 % (ref 43.0–77.0)
Platelets: 252 10*3/uL (ref 150.0–400.0)
RBC: 4.7 Mil/uL (ref 3.87–5.11)
RDW: 12.8 % (ref 11.5–15.5)
WBC: 6.1 10*3/uL (ref 4.0–10.5)

## 2023-01-23 LAB — IBC PANEL
Iron: 80 ug/dL (ref 42–145)
Saturation Ratios: 18.8 % — ABNORMAL LOW (ref 20.0–50.0)
TIBC: 425.6 ug/dL (ref 250.0–450.0)
Transferrin: 304 mg/dL (ref 212.0–360.0)

## 2023-01-23 LAB — FERRITIN: Ferritin: 11.5 ng/mL (ref 10.0–291.0)

## 2023-01-23 NOTE — Assessment & Plan Note (Signed)
Patient has responded somewhat to the Botox injections and some of the home exercises.  Will hold on any other significant medication changes at the moment.  Discussed icing regimen and home exercises.  Discussed posture and ergonomics.  Increase activity slowly over the course of next several weeks.  Follow-up with me again in 8 to 12 weeks

## 2023-01-23 NOTE — Therapy (Signed)
OUTPATIENT PHYSICAL THERAPY FEMALE PELVIC TREATMENT   Patient Name: [name] [name] MRN: [id number] DOB:06/13/1965, 57 y.o., female Today's Date: 01/23/2023 Progress Note Reporting Period 10/17/22 to 01/23/23  See note below for Objective Data and Assessment of Progress/Goals.     END OF SESSION:  PT End of Session - 01/23/23 0934     Visit Number 10    Date for PT Re-Evaluation 01/23/23    Authorization Type [employer]    Authorization Time Period 01/23/2023-04/11/2023    Authorization - Visit Number 1    Authorization - Number of Visits 8    Progress Note Due on Visit 20    PT Start Time 0930    PT Stop Time 1010    PT Time Calculation (min) 40 min    Activity Tolerance Patient tolerated treatment well    Behavior During Therapy WFL for tasks assessed/performed             Past Medical History:  Diagnosis Date   Anxiety    Brain fog    Chronic pain    COMPLEX REGIONAL PAIN SYNDROME   Hemorrhoids    History of neck problems    dry needling being done & PT   Leg cramping 2020   right, says sometimes she has a hard time putting pressure on that leg    Migraine headache    Sarcoidosis    Past Surgical History:  Procedure Laterality Date   CHOLECYSTECTOMY     DILATION AND CURETTAGE OF UTERUS     ESOPHAGEAL MANOMETRY N/A 05/05/2018   Procedure: ESOPHAGEAL MANOMETRY (EM);  Surgeon: Vida Rigger, MD;  Location: [facility];  Service: Endoscopy;  Laterality: N/A;   SHOULDER ARTHROSCOPY Left 2008   THORACOSCOPY     TONSILLECTOMY AND ADENOIDECTOMY     AT AGE 43   WISDOM TOOTH EXTRACTION     Patient Active Problem List   Diagnosis Date Noted   Fungal rash of torso 11/23/2022   Pelvic floor dysfunction 10/12/2022   Nasal congestion 06/16/2019   Post-nasal drainage 06/16/2019   Chronic migraine without aura, with intractable migraine, so stated, with status migrainosus 04/30/2019   Globus pharyngeus 04/14/2018   Laryngopharyngeal reflux (LPR) 10/30/2017   Lung  nodule 10/30/2017   ANA positive 06/21/2014   Arthralgia 03/22/2014   Fatigue 03/22/2014   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   Radial tunnel syndrome 03/22/2014   Right foot pain 11/04/2012   Migraine with aura 02/06/2011   CONSTIPATION, CHRONIC 07/11/2007   DYSPHAGIA, PHARYNGOESOPHAGEAL PHASE 07/11/2007   NAUSEA WITH VOMITING 06/13/2007   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     PAIN:  Are you having pain? Yes NPRS scale: 8/10 Pain location:  rectal  Pain type: burning, sharp, and stabbing Pain description: intermittent   Aggravating factors: bowel movements Relieving factors: no bowel movements  PRECAUTIONS: None  WEIGHT BEARING RESTRICTIONS: No  FALLS:  Has patient fallen in last 6 months? No  LIVING ENVIRONMENT: Lives with: lives with their spouse   OCCUPATION: retired  PLOF: Independent  PATIENT GOALS: reduce pain and fissure management  PERTINENT HISTORY:  Sarcoidosis; Cholecystectomy; Complex regional pain syndrome   BOWEL MOVEMENT: Pain with bowel movement: Yes Type of bowel movement:Type (Bristol Stool Scale) Type 1, 4, Frequency 1-2 days, Strain Yes, and Splinting no Fully empty rectum: No Leakage: No Pads:  No Fiber supplement: No  URINATION: Pain with urination: No Fully empty bladder: No, after urination she will hold her buttocks to get the rest of the urine out when she has a bowel movement Stream:  average Urgency: Yes: sometimes Frequency: during day 4 times; night 1 time Leakage:  none Pads: No  INTERCOURSE: Pain with intercourse:  none Ability to have vaginal penetration:  Yes:   Climax: yes Marinoff Scale: 0/3  PREGNANCY: Vaginal deliveries 1 Tearing Yes: had some, episiotomy   PROLAPSE: None   OBJECTIVE:   DIAGNOSTIC FINDINGS:  none  PATIENT SURVEYS:  [id number]  COGNITION: Overall cognitive status: Within functional limits for tasks assessed     SENSATION: Light touch: Appears intact Proprioception: Appears intact   POSTURE: No Significant postural limitations  PELVIC ALIGNMENT: correct alignment  LUMBARAROM/PROM:  A/PROM A/PROM  eval  Flexion Decreased by 25%  Extension Decreased by 25%  Right lateral flexion full  Left lateral flexion full  Right rotation full  Left rotation full   (Blank rows = not tested)  LOWER EXTREMITY ROM: bilateral hip ROM is full   LOWER EXTREMITY MMT:  MMT Right eval Left eval  Hip flexion 3+/5 5/5  Hip extension 4/5 5/5  Hip abduction 3/5 4/5  Hip adduction 4/5 5/5   PALPATION:   General  lower abdominals will bulge with contraction                 External Perineal Exam tenderness located on the right external anal sphincter, along the perineal body, pelvic floor contraction she will engage the gluteal, rectum is cauliflower shape.                              Internal Pelvic Floor not assessed  Patient confirms identification and approves PT to assess internal pelvic floor and treatment Yes  PELVIC MMT:   MMT eval 11/14/22  Vaginal    Internal Anal Sphincter 2/5 2/5 with very little lift  External Anal Sphincter 2/5 2/5 with very little lift  Puborectalis 2/5 2/5 with taping to come forward   Diastasis Recti 1 finger width above and below umbilicus   (Blank rows = not tested)        TONE: Unable to assess due to patient having open areas on her hemorrhoids and the rectum was very red. Patient had pain with the attempt to place the pinky finger into the canal so therapist did not try. She placed [employer] onto the rectum.   PROLAPSE: none  TODAY'S TREATMENT:   01/23/23  Exercises: Strengthening: Discussed patient HEP and the importance of her doing them to keep up the muscle tone of the sphincter  muscles.  Discussed with patient on her symptoms and reducing the rectal prolapse and the importance of continuing her exercises.  Discussed with patient on returning to therapy if she has surgery to correct her rectal prolapse.     12/17/22 Manual: Soft tissue mobilization: Manual work to the puborectalis, external anal sphincter, perineal body to elongate after dry needling and assess for dry needling Trigger Point Dry-Needling  Treatment instructions: Expect mild to moderate muscle soreness. S/S of pneumothorax if dry needled over a lung field, and to seek immediate medical attention should they occur. Patient verbalized understanding of these instructions and education.  Patient Consent Given: Yes Education handout provided: Yes Muscles treated:  perineal body,  puborectalis, Electrical stimulation performed: No Parameters: N/A Treatment response/outcome: elongation of muscle and trigger point response Neuromuscular re-education: Pelvic floor contraction training: Using the RUSI along the perineal area on pelvic floor setting to work on anal contraction without bearing down with coughing, laughing, marching and SLR.   12/10/22 Manual: Internal pelvic floor techniques: No emotional/communication barriers or cognitive limitation. Patient is motivated to learn. Patient understands and agrees with treatment goals and plan. PT explains patient will be examined in  standing, sitting, and lying down to see how their muscles and joints work. When they are ready, they will be asked to remove their underwear so PT can examine their perineum. The patient is also given the option of providing their own chaperone as one is not provided in our facility. The patient also has the right and is explained the right to defer or refuse any part of the evaluation or treatment including the internal exam. With the patient's consent, PT will use one gloved finger to gently assess the muscles of the pelvic floor, seeing how well it contracts and relaxes and if there is muscle symmetry. After, the patient will get dressed and PT and patient will discuss exam findings and plan of care. PT and patient discuss plan of care, schedule, attendance policy and HEP activities.  Going through the rectum working on the puborectalis, anal sphincter and iliococcygeus to lengthen after dry needling Trigger Point Dry-Needling  Treatment instructions: Expect mild to moderate muscle soreness. S/S of pneumothorax if dry needled over a lung field, and to seek immediate medical attention should they occur. Patient verbalized understanding of these instructions and education.  Patient Consent Given: Yes Education handout provided: Yes Muscles treated:  perineal body,  puborectalis, Electrical stimulation performed: No Parameters: N/A Treatment response/outcome: elongation of muscle and trigger point response Neuromuscular re-education: Pelvic floor contraction training: Therapist finger on the rectum working on pushing the therapist finger out of the rectum with breath in sidely Therapeutic activities: Functional strengthening activities: Educated patient on how to breath while pushing the stool out, opening the rib cage especially the posterior rib cage, knees above the hips.  PATIENT EDUCATION: 01/23/23 Education details: Access Code: [email or web address], how to toilet correctly Person educated: Patient Education method: Explanation, Demonstration, Tactile cues, Verbal cues, and Handouts Education comprehension: verbalized understanding, returned demonstration, verbal cues required, tactile cues required, and needs further education    HOME EXERCISE PROGRAM: 01/23/23 Access Code: [email or web address] URL: [email or web address] Date: 12/03/2022 Prepared by: Eulis Foster  Exercises - - Bent Knee Fallouts  - 1 x daily - 3 x weekly - 1 sets - 10 reps - Supine March  - 1 x daily - 3 x weekly - 2 sets - 10 reps - Small Range Straight Leg Raise  - 1 x daily - 3 x weekly - 2 sets - 10 reps   ASSESSMENT:  CLINICAL IMPRESSION: Patient is a 57 y.o. female who was seen today for physical therapy  treatment for pelvic floor dysfunction. Patient now has increased tissue coming out of the rectum that is similar to a rectal prolapse. She has increased blood that is coming out of the rectum when she has a bowel movement. She is working on her fiber intake to keep her stool soft. Patient will continue her exercises and look into further consultation for her symptoms.   OBJECTIVE IMPAIRMENTS: decreased activity tolerance, decreased coordination, decreased endurance, decreased strength, increased fascial restrictions, increased muscle spasms, and pain.   ACTIVITY LIMITATIONS: toileting  PARTICIPATION LIMITATIONS: meal prep, cleaning, laundry, driving, shopping, and community activity  PERSONAL FACTORS: Time since onset of injury/illness/exacerbation and 1-2 comorbidities: Sarcoidosis; Cholecystectomy; Complex regional pain syndrome  are also affecting patient's functional outcome.   REHAB POTENTIAL: Excellent  CLINICAL DECISION MAKING: Evolving/moderate complexity  EVALUATION COMPLEXITY: Moderate   GOALS: Goals reviewed with patient? Yes  SHORT TERM GOALS: Target date:  11/12/22  Patient independent with fissure care.  Baseline: Goal status: Met 11/14/22  2.  Patient educated on vaginal moisturizers to improve pelvic floor health.  Baseline:  Goal status: Met 11/14/22  3.  Patient able to perform diaphragmatic breathing to relax her pelvic floor for toileting.  Baseline:  Goal status: Met 10/31/22  4.  Patient understands how to massage around the anus prior to bowel movement.  Baseline:  Goal status: Met 12/10/22   LONG TERM GOALS: Target date: 01/09/23  Patient independent with advanced HEP for core and hip strength.  Baseline:  Goal status: Met 01/23/23 2.  Patient is able to bulge her pelvic floor for correct toileting to assist with decreased strain on her fissure.  Baseline:  Goal status: Met 12/10/22  3.  Patient is able to have a bowel movement with pain decreased >/= 75% due to the ability to relax her pelvic floor.  Baseline:  Goal status: not met 01/23/23 4.  Patient is able to contract her lower abdomen instead of bulge it to correctly push the stool out.  Baseline:  Goal status: not met 01/23/23  5.  CRAIG-7 score is </= 20 compared to 95  Baseline:  Goal status: not met 01/23/23    PLAN:Discharge to HEP and further consultation. Patient attended 1 more therapy session to review HEP and be discharged.      Eulis Foster, PT 01/23/23 10:13 AM  PHYSICAL THERAPY DISCHARGE SUMMARY  Visits from Start of Care: 10  Current functional level related to goals / functional outcomes: See above. Patient has increased tissue protruding from the rectum that is similar to a rectal prolapse.    Remaining deficits: See above.    Education / Equipment: HEP   Patient agrees to discharge. Patient  goals were partially met. Patient is being discharged due to maximized rehab potential.  Thank you for the referral. Eulis Foster, PT 01/23/23 10:13 AM

## 2023-01-23 NOTE — Assessment & Plan Note (Signed)
Patient has signs of rectal prolapse, has had done banding previously for some hemorrhoids patient states.  Has had pelvic floor dysfunction.  Will refer patient appropriate.  Working on that we will get back to patient.

## 2023-01-23 NOTE — Assessment & Plan Note (Signed)
   Decision today to treat with OMT was based on Physical Exam  After verbal consent patient was treated with HVLA, ME, FPR techniques in cervical, thoracic, rib, areas, all areas are chronic   Patient tolerated the procedure well with improvement in symptoms  Patient given exercises, stretches and lifestyle modifications  See medications in patient instructions if given  Patient will follow up in 8-12 weeks

## 2023-01-23 NOTE — Patient Instructions (Signed)
Good to see you  CBC, CMET, ferritin iron panel today  I think the botox is working  See me again in 8 weeks

## 2023-01-28 ENCOUNTER — Ambulatory Visit: Payer: Medicare HMO | Admitting: Physical Therapy

## 2023-01-31 ENCOUNTER — Encounter: Payer: Self-pay | Admitting: Family Medicine

## 2023-02-04 ENCOUNTER — Encounter: Payer: Medicare HMO | Admitting: Physical Therapy

## 2023-03-08 ENCOUNTER — Ambulatory Visit: Payer: Medicare HMO | Admitting: Obstetrics and Gynecology

## 2023-03-08 ENCOUNTER — Encounter: Payer: Self-pay | Admitting: Obstetrics and Gynecology

## 2023-03-08 VITALS — BP 112/72 | HR 109 | Ht 60.24 in | Wt 109.4 lb

## 2023-03-08 DIAGNOSIS — K623 Rectal prolapse: Secondary | ICD-10-CM

## 2023-03-08 DIAGNOSIS — N812 Incomplete uterovaginal prolapse: Secondary | ICD-10-CM

## 2023-03-08 NOTE — Progress Notes (Signed)
[facility] New Patient Evaluation and Consultation  Referring Provider: Judi Saa, DO PCP: Gaspar Garbe, MD Date of Service: 03/08/2023  SUBJECTIVE Chief Complaint: New Patient (Initial Visit)  History of Present Illness: [name] [name] is a 57 y.o. White or Caucasian female seen in consultation at the request of Dr. Katrinka Blazing for evaluation of rectal prolapse.    Urinary Symptoms: Does not leak urine.   Day time voids 5.  Nocturia: 1 times per night to void. Voiding dysfunction: she does not empty her bladder well.  does not use a catheter to empty bladder.  When urinating, she feels the need to urinate multiple times in a row and to push on her belly or vagina to empty bladder She has to hold the rectum in order to pee if she is having rectal prolapse issues  UTIs:  0  UTI's in the last year.   Denies history of blood in urine and kidney or bladder stones  Pelvic Organ Prolapse Symptoms:                  She Denies a feeling of a bulge the vaginal area, only the rectum  Has history of fissures, internal hemorrhoids. Recently has noticed rectal prolapse through the anal opening. Not sure how long it has been an issue but suspects over a year- that's when she started feeling more pressure.   Bowel Symptom: Bowel movements: every other day Stool consistency: hard Straining: yes.  Splinting: yes.  Incomplete evacuation: yes.  She Denies accidental bowel leakage / fecal incontinence Bowel regimen: stool softener, miralax, and probiotic/ prebiotic blend  Has been to pelvic PT  Sexual Function Sexually active: yes.  Sexual orientation:  heterosexual Pain with sex: deep in the pelvis Had to have surgery for "scar" at the opening of the vagina and had difficulty with intercourse prior to this  Pelvic Pain Denies pelvic pain  Past Medical History:  Past Medical History:  Diagnosis Date   Anxiety    Brain fog    Chronic pain    COMPLEX REGIONAL  PAIN SYNDROME   Hemorrhoids    History of neck problems    dry needling being done & PT   Leg cramping 2020   right, says sometimes she has a hard time putting pressure on that leg    Migraine headache    Sarcoidosis      Past Surgical History:   Past Surgical History:  Procedure Laterality Date   CHOLECYSTECTOMY     DILATION AND CURETTAGE OF UTERUS     ESOPHAGEAL MANOMETRY N/A 05/05/2018   Procedure: ESOPHAGEAL MANOMETRY (EM);  Surgeon: Vida Rigger, MD;  Location: [facility];  Service: Endoscopy;  Laterality: N/A;   PERINEOPLASTY     had scarring after her delivery   SHOULDER ARTHROSCOPY Left 06/11/2006   THORACOSCOPY Left    TONSILLECTOMY AND ADENOIDECTOMY     AT AGE 52   WISDOM TOOTH EXTRACTION       Past OB/GYN History: OB History  Gravida Para Term Preterm AB Living  2 1     1 1   SAB IAB Ectopic Multiple Live Births  1            # Outcome Date GA Lbr Len/2nd Weight Sex Type Anes PTL Lv  2 SAB           1 Para      Vag-Forceps       Menopausal: Denies vaginal bleeding since menopause Last  pap smear was 2023- negative per patient.     Medications: She has a current medication list which includes the following prescription(s): alprazolam, amphet-dextroamphet 3-bead er, botox, cetirizine hcl, cholecalciferol, fluticasone, NONFORMULARY OR COMPOUNDED ITEM, paroxetine, progesterone, rizatriptan, trazodone hcl, UNABLE TO FIND, and hydrocodone-acetaminophen.   Allergies: Patient is allergic to metoclopramide hcl, penicillins, and reglan [metoclopramide].   Social History:  Social History   Tobacco Use   Smoking status: Never   Smokeless tobacco: Never  Vaping Use   Vaping status: Never Used  Substance Use Topics   Alcohol use: Not Currently    Alcohol/week: 1.0 standard drink of alcohol    Types: 1 Standard drinks or equivalent per week   Drug use: No    Relationship status: married She lives with husband, daughter.   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  Respiratory:  Negative for cough, shortness of breath and wheezing.   Cardiovascular:  Negative for chest pain, palpitations and leg swelling.  Gastrointestinal:  Positive for abdominal pain and blood in stool.  Genitourinary:  Negative for dysuria.  Musculoskeletal:  Negative for myalgias.  Skin:  Negative for rash.  Neurological:  Positive for headaches. Negative for dizziness.  Endo/Heme/Allergies:  Bruises/bleeds easily.  Psychiatric/Behavioral:  Negative for depression. The patient is nervous/anxious.      OBJECTIVE Physical Exam: Vitals:   03/08/23 1058  BP: 112/72  Pulse: (!) 109  Weight: 109 lb 6.4 oz (49.6 kg)  Height: 5' 0.24" (1.53 m)    Physical Exam Constitutional:      General: She is not in acute distress. Pulmonary:     Effort: Pulmonary effort is normal.  Abdominal:     General: There is no distension.     Palpations: Abdomen is soft.     Tenderness: There is no abdominal tenderness. There is no rebound.  Musculoskeletal:        General: No swelling. Normal range of motion.  Skin:    General: Skin is warm and dry.     Findings: No rash.  Neurological:     Mental Status: She is alert and oriented to person, place, and time.  Psychiatric:        Mood and Affect: Mood normal.        Behavior: Behavior normal.      GU / Detailed Urogynecologic Evaluation:  Pelvic Exam: Normal external female genitalia; Bartholin's and Skene's glands normal in appearance; urethral meatus normal in appearance, no urethral masses or discharge.   CST: negative  Speculum exam  reveals normal vaginal mucosa with atrophy. Cervix with ectropion present.  Uterus normal single, nontender. Adnexa no mass, fullness, tenderness.    Pelvic floor strength II/V, puborectalis IV/V external anal sphincter IV/V  Pelvic floor musculature: Right levator non-tender, Right obturator non-tender, Left levator non-tender, Left obturator non-tender  POP-Q:   POP-Q  -1  Aa   -1                                           Ba  -4.5                                              C   4                                            Gh  4.5                                            Pb  8                                            tvl   0                                            Ap  0                                            Bp  -7.5                                              D      Rectal Exam:  Normal sphincter tone, moderate distal rectocele, enterocoele not present, no rectal masses, noted dyssynergia when asking the patient to bear down.  Post-Void Residual (PVR) by Bladder Scan: In order to evaluate bladder emptying, we discussed obtaining a postvoid residual and she agreed to this procedure.  Procedure: The ultrasound unit was placed on the patient's abdomen in the suprapubic region after the patient had voided. A PVR of 28 ml was obtained by bladder scan.  Laboratory Results: POC urine:    ASSESSMENT AND PLAN Ms. Reichart is a 57 y.o. with:  1. Rectal prolapse   2. Uterovaginal prolapse, incomplete    Rectal prolapse - patient provided picture of rectal prolapse. We discussed she will need a referral to colorectal surgery for evaluation and treatment options.   2. Stage II anterior, Stage II posterior, Stage I apical prolapse - Has concurrent vaginal prolapse but not very symptomatic other than occasional pressure when trying to urinate. She emptied her bladder well today.  - Do not necessarily recommend treatment  of vaginal prolapse at this time, although we discussed straining with defecation may worsen symptoms over time.  - We briefly reviewed options of a pessary or surgery.    Return as needed  Marguerita Beards, MD

## 2023-03-19 ENCOUNTER — Ambulatory Visit: Payer: Medicare HMO | Admitting: Adult Health

## 2023-03-20 ENCOUNTER — Ambulatory Visit: Payer: Medicare HMO | Admitting: Family Medicine

## 2023-03-20 ENCOUNTER — Ambulatory Visit: Payer: Medicare HMO | Admitting: Adult Health

## 2023-03-20 DIAGNOSIS — G43711 Chronic migraine without aura, intractable, with status migrainosus: Secondary | ICD-10-CM | POA: Diagnosis not present

## 2023-03-20 MED ORDER — ONABOTULINUMTOXINA 100 UNITS IJ SOLR
155.0000 [IU] | Freq: Once | INTRAMUSCULAR | Status: AC
Start: 2023-03-20 — End: 2023-03-20
  Administered 2023-03-20: 155 [IU] via INTRAMUSCULAR

## 2023-03-20 NOTE — Progress Notes (Signed)
Botox- 100 units x 2 vials Lot: [id number] Expiration: 11/26 NDC: 2841-3244-01 & Lot: [id number] Expiration: 05/2025 NDC: 6440-3474-25  Bacteriostatic 0.9% Sodium Chloride- 4 mL  Lot: [id number] Expiration: 09/10/2023 NDC: 5643-3295-18  Dx: A41.660 B/B Witnessed by Alveria Apley CMA

## 2023-03-20 NOTE — Progress Notes (Signed)
03/20/23: Botox working well. Shoulders must better this time  12/20/22: Botox working well. Continues to have neck pain. Seeing sports medicine and doing dry needling. Hurts more on the right than left.   09/18/2022: still doing well > 70% improvement in migraine freq and severity and headache as well. Tightness in neck, tried muscle relaxers, conservative measures, dryneedling: send to dr Katrinka Blazing and see if he can help.     06/19/2022: botox working well. Reports neck stiffnes and should tightness left > right   BOTOX PROCEDURE NOTE FOR MIGRAINE HEADACHE    Contraindications and precautions discussed with patient(above). Aseptic procedure was observed and patient tolerated procedure. Procedure performed by Butch Penny, NP  The condition has existed for more than 6 months, and pt does not have a diagnosis of ALS, Myasthenia Gravis or Lambert-Eaton Syndrome.  Risks and benefits of injections discussed and pt agrees to proceed with the procedure.  Written consent obtained  These injections are medically necessary. These injections do not cause sedations or hallucinations which the oral therapies may cause.  Indication/Diagnosis: chronic migraine BOTOX(J0585) injection was performed according to protocol by [employer]. 200 units of BOTOX was dissolved into 4 cc NS.   NDC: [address]-40  Type of toxin: Botox   Botox- 100 units x 2 vials Lot: [id number] Expiration: 11/26 NDC: 7829-5621-30 & Lot: [id number] Expiration: 05/2025 NDC: 9629-5284-13   Bacteriostatic 0.9% Sodium Chloride- 4 mL  Lot: [id number] Expiration: 09/10/2023 NDC: 2725-3664-40         Description of procedure:  The patient was placed in a sitting position. The standard protocol was used for Botox as follows, with 5 units of Botox injected at each site:   -Procerus muscle, midline injection  -Corrugator muscle, bilateral injection  -Frontalis muscle, bilateral injection, with 2 sites each side, medial injection was  performed in the upper one third of the frontalis muscle, in the region vertical from the medial inferior edge of the superior orbital rim. The lateral injection was again in the upper one third of the forehead vertically above the lateral limbus of the cornea, 1.5 cm lateral to the medial injection site.  -Temporalis muscle injection, 4 sites, bilaterally. The first injection was 3 cm above the tragus of the ear, second injection site was 1.5 cm to 3 cm up from the first injection site in line with the tragus of the ear. The third injection site was 1.5-3 cm forward between the first 2 injection sites. The fourth injection site was 1.5 cm posterior to the second injection site.  -Occipitalis muscle injection, 3 sites, bilaterally. The first injection was done one half way between the occipital protuberance and the tip of the mastoid process behind the ear. The second injection site was done lateral and superior to the first, 1 fingerbreadth from the first injection. The third injection site was 1 fingerbreadth superiorly and medially from the first injection site.  -Cervical paraspinal muscle injection, 2 sites, bilateral knee first injection site was 1 cm from the midline of the cervical spine, 3 cm inferior to the lower border of the occipital protuberance. The second injection site was 1.5 cm superiorly and laterally to the first injection site.  -Trapezius muscle injection was performed at 3 sites, bilaterally. The first injection site was in the upper trapezius muscle halfway between the inflection point of the neck, and the acromion. The second injection site was one half way between the acromion and the first injection site. The third injection was done between the first injection  site and the inflection point of the neck. One injection below the third injection (3 units) bilaterally    Will return for repeat injection in 3 months.   A 200 units of Botox was used, 161 units were injected, the  rest of the Botox was wasted. The patient tolerated the procedure well, there were no complications of the above procedure.  Butch Penny, MSN, NP-C 03/20/2023, 3:29 PM [facility] [address], [address] [place], [place] [address] [phone]

## 2023-03-27 DIAGNOSIS — Z01411 Encounter for gynecological examination (general) (routine) with abnormal findings: Secondary | ICD-10-CM | POA: Diagnosis not present

## 2023-03-27 DIAGNOSIS — Z124 Encounter for screening for malignant neoplasm of cervix: Secondary | ICD-10-CM | POA: Diagnosis not present

## 2023-03-27 DIAGNOSIS — Z113 Encounter for screening for infections with a predominantly sexual mode of transmission: Secondary | ICD-10-CM | POA: Diagnosis not present

## 2023-03-27 DIAGNOSIS — Z6821 Body mass index (BMI) 21.0-21.9, adult: Secondary | ICD-10-CM | POA: Diagnosis not present

## 2023-03-27 DIAGNOSIS — Z1231 Encounter for screening mammogram for malignant neoplasm of breast: Secondary | ICD-10-CM | POA: Diagnosis not present

## 2023-03-27 DIAGNOSIS — K623 Rectal prolapse: Secondary | ICD-10-CM | POA: Diagnosis not present

## 2023-03-27 DIAGNOSIS — Z01419 Encounter for gynecological examination (general) (routine) without abnormal findings: Secondary | ICD-10-CM | POA: Diagnosis not present

## 2023-06-11 ENCOUNTER — Telehealth: Payer: Self-pay | Admitting: Adult Health

## 2023-06-11 NOTE — Telephone Encounter (Signed)
Submitted auth renewal via CMM, status is pending. Key: [id number]

## 2023-06-17 NOTE — Telephone Encounter (Signed)
 Received approval, pt will continue to be buy/bill.  Auth: [id number] (06/11/23-06/10/24)

## 2023-06-21 DIAGNOSIS — E7800 Pure hypercholesterolemia, unspecified: Secondary | ICD-10-CM | POA: Diagnosis not present

## 2023-06-21 DIAGNOSIS — R7989 Other specified abnormal findings of blood chemistry: Secondary | ICD-10-CM | POA: Diagnosis not present

## 2023-06-25 NOTE — Progress Notes (Deleted)
 12/20/22: Botox  working well. Continues to have neck pain. Seeing sports medicine and doing dry needling. Hurts more on the right than left.   09/18/2022: still doing well > 70% improvement in migraine freq and severity and headache as well. Tightness in neck, tried muscle relaxers, conservative measures, dryneedling: send to dr Felipe Horton and see if he can help.     06/19/2022: botox  working well. Reports neck stiffnes and should tightness left > right   BOTOX  PROCEDURE NOTE FOR MIGRAINE HEADACHE    Contraindications and precautions discussed with patient(above). Aseptic procedure was observed and patient tolerated procedure. Procedure performed by Clem Currier, NP  The condition has existed for more than 6 months, and pt does not have a diagnosis of ALS, Myasthenia Gravis or Lambert-Eaton Syndrome.  Risks and benefits of injections discussed and pt agrees to proceed with the procedure.  Written consent obtained  These injections are medically necessary. These injections do not cause sedations or hallucinations which the oral therapies may cause.  Indication/Diagnosis: chronic migraine BOTOX (A5409) injection was performed according to protocol by [employer]. 200 units of BOTOX  was dissolved into 4 cc NS.   NDC: [address]-95  Type of toxin: Botox  Botox - 100 units x 2 vials Lot: [id number] Expiration: 08/2024 NDC: 7846-9629-52   Bacteriostatic 0.9% Sodium Chloride - 4mL total Lot: [id number]     Expiration: 02/10/2023 NDC: 4010-2725-36   Dx: U44.034     Description of procedure:  The patient was placed in a sitting position. The standard protocol was used for Botox  as follows, with 5 units of Botox  injected at each site:   -Procerus muscle, midline injection  -Corrugator muscle, bilateral injection  -Frontalis muscle, bilateral injection, with 2 sites each side, medial injection was performed in the upper one third of the frontalis muscle, in the region vertical from the medial  inferior edge of the superior orbital rim. The lateral injection was again in the upper one third of the forehead vertically above the lateral limbus of the cornea, 1.5 cm lateral to the medial injection site.  -Temporalis muscle injection, 4 sites, bilaterally. The first injection was 3 cm above the tragus of the ear, second injection site was 1.5 cm to 3 cm up from the first injection site in line with the tragus of the ear. The third injection site was 1.5-3 cm forward between the first 2 injection sites. The fourth injection site was 1.5 cm posterior to the second injection site.  -Occipitalis muscle injection, 3 sites, bilaterally. The first injection was done one half way between the occipital protuberance and the tip of the mastoid process behind the ear. The second injection site was done lateral and superior to the first, 1 fingerbreadth from the first injection. The third injection site was 1 fingerbreadth superiorly and medially from the first injection site.  -Cervical paraspinal muscle injection, 2 sites, bilateral knee first injection site was 1 cm from the midline of the cervical spine, 3 cm inferior to the lower border of the occipital protuberance. The second injection site was 1.5 cm superiorly and laterally to the first injection site.  -Trapezius muscle injection was performed at 3 sites, bilaterally. The first injection site was in the upper trapezius muscle halfway between the inflection point of the neck, and the acromion. The second injection site was one half way between the acromion and the first injection site. The third injection was done between the first injection site and the inflection point of the neck. One injection below the third injection (  3 units)   Will return for repeat injection in 3 months.   A 200 units of Botox  was used, 158 units were injected, the rest of the Botox  was wasted. The patient tolerated the procedure well, there were no complications of the  above procedure.  Clem Currier, MSN, NP-C 06/25/2023, 4:05 PM [facility] [address], [address] [place], [place] [address] [phone]

## 2023-06-26 ENCOUNTER — Ambulatory Visit: Payer: Medicare HMO | Admitting: Adult Health

## 2023-06-26 VITALS — BP 124/86 | HR 106

## 2023-06-26 DIAGNOSIS — G43711 Chronic migraine without aura, intractable, with status migrainosus: Secondary | ICD-10-CM

## 2023-06-26 MED ORDER — ONABOTULINUMTOXINA 200 UNITS IJ SOLR
155.0000 [IU] | Freq: Once | INTRAMUSCULAR | Status: AC
Start: 2023-06-26 — End: 2023-06-26
  Administered 2023-06-26: 161 [IU] via INTRAMUSCULAR

## 2023-06-26 NOTE — Progress Notes (Signed)
 Botox - 200 units x 1 vial Lot: [id number] Expiration: 08/2025 NDC: 1610-9604-54   Bacteriostatic 0.9% Sodium Chloride - 4 mL  Lot: [id number] Expiration: 09/10/2023 NDC: 14782956-21   Dx: H08.657 BB Witnessed by Lissa Riding RN

## 2023-06-26 NOTE — Progress Notes (Signed)
 06/26/23: Botox  working well. No migraines. May get a sinus headaches. Continues to have neck-shoulder pain. Pain worse by the end of the day. Had MRI cervical spine in 2020.  03/20/23: Botox  working well. Shoulders must better this time  12/20/22: Botox  working well. Continues to have neck pain. Seeing sports medicine and doing dry needling. Hurts more on the right than left.   09/18/2022: still doing well > 70% improvement in migraine freq and severity and headache as well. Tightness in neck, tried muscle relaxers, conservative measures, dryneedling: send to dr Felipe Horton and see if he can help.     06/19/2022: botox  working well. Reports neck stiffnes and should tightness left > right   BOTOX  PROCEDURE NOTE FOR MIGRAINE HEADACHE    Contraindications and precautions discussed with patient(above). Aseptic procedure was observed and patient tolerated procedure. Procedure performed by Clem Currier, NP  The condition has existed for more than 6 months, and pt does not have a diagnosis of ALS, Myasthenia Gravis or Lambert-Eaton Syndrome.  Risks and benefits of injections discussed and pt agrees to proceed with the procedure.  Written consent obtained  These injections are medically necessary. These injections do not cause sedations or hallucinations which the oral therapies may cause.  Indication/Diagnosis: chronic migraine BOTOX (U9811) injection was performed according to protocol by [employer]. 200 units of BOTOX  was dissolved into 4 cc NS.   NDC: [address]-21  Type of toxin: Botox  Botox - 200 units x 1 vial Lot: [id number] Expiration: 08/2025 NDC: 3086-5784-69   Bacteriostatic 0.9% Sodium Chloride - 4 mL  Lot: [id number] Expiration: 09/10/2023 NDC: 41324401-02   Dx: V25.366     Description of procedure:  The patient was placed in a sitting position. The standard protocol was used for Botox  as follows, with 5 units of Botox  injected at each site:   -Procerus muscle, midline  injection  -Corrugator muscle, bilateral injection  -Frontalis muscle, bilateral injection, with 2 sites each side, medial injection was performed in the upper one third of the frontalis muscle, in the region vertical from the medial inferior edge of the superior orbital rim. The lateral injection was again in the upper one third of the forehead vertically above the lateral limbus of the cornea, 1.5 cm lateral to the medial injection site.  -Temporalis muscle injection, 4 sites, bilaterally. The first injection was 3 cm above the tragus of the ear, second injection site was 1.5 cm to 3 cm up from the first injection site in line with the tragus of the ear. The third injection site was 1.5-3 cm forward between the first 2 injection sites. The fourth injection site was 1.5 cm posterior to the second injection site.  -Occipitalis muscle injection, 3 sites, bilaterally. The first injection was done one half way between the occipital protuberance and the tip of the mastoid process behind the ear. The second injection site was done lateral and superior to the first, 1 fingerbreadth from the first injection. The third injection site was 1 fingerbreadth superiorly and medially from the first injection site.  -Cervical paraspinal muscle injection, 2 sites, bilateral knee first injection site was 1 cm from the midline of the cervical spine, 3 cm inferior to the lower border of the occipital protuberance. The second injection site was 1.5 cm superiorly and laterally to the first injection site.  -Trapezius muscle injection was performed at 3 sites, bilaterally. The first injection site was in the upper trapezius muscle halfway between the inflection point of the neck, and the acromion. The second  injection site was one half way between the acromion and the first injection site. The third injection was done between the first injection site and the inflection point of the neck. One injection slighty below the third  injection (3 units) bilaterally.   Will return for repeat injection in 3 months.   A 200 units of Botox  was used, 161 units were injected, the rest of the Botox  was wasted. The patient tolerated the procedure well, there were no complications of the above procedure.  Clem Currier, MSN, NP-C 06/26/2023, 3:06 PM [facility] [address], [address] [place], [place] [address] [phone]

## 2023-06-28 DIAGNOSIS — M2550 Pain in unspecified joint: Secondary | ICD-10-CM | POA: Diagnosis not present

## 2023-06-28 DIAGNOSIS — Z1339 Encounter for screening examination for other mental health and behavioral disorders: Secondary | ICD-10-CM | POA: Diagnosis not present

## 2023-06-28 DIAGNOSIS — M542 Cervicalgia: Secondary | ICD-10-CM | POA: Diagnosis not present

## 2023-06-28 DIAGNOSIS — D869 Sarcoidosis, unspecified: Secondary | ICD-10-CM | POA: Diagnosis not present

## 2023-06-28 DIAGNOSIS — G4700 Insomnia, unspecified: Secondary | ICD-10-CM | POA: Diagnosis not present

## 2023-06-28 DIAGNOSIS — Z0000 Encounter for general adult medical examination without abnormal findings: Secondary | ICD-10-CM | POA: Diagnosis not present

## 2023-06-28 DIAGNOSIS — R590 Localized enlarged lymph nodes: Secondary | ICD-10-CM | POA: Diagnosis not present

## 2023-06-28 DIAGNOSIS — F418 Other specified anxiety disorders: Secondary | ICD-10-CM | POA: Diagnosis not present

## 2023-06-28 DIAGNOSIS — R82998 Other abnormal findings in urine: Secondary | ICD-10-CM | POA: Diagnosis not present

## 2023-06-28 DIAGNOSIS — E7800 Pure hypercholesterolemia, unspecified: Secondary | ICD-10-CM | POA: Diagnosis not present

## 2023-06-28 DIAGNOSIS — G43711 Chronic migraine without aura, intractable, with status migrainosus: Secondary | ICD-10-CM | POA: Diagnosis not present

## 2023-06-28 DIAGNOSIS — Z1331 Encounter for screening for depression: Secondary | ICD-10-CM | POA: Diagnosis not present

## 2023-07-02 ENCOUNTER — Other Ambulatory Visit: Payer: Self-pay | Admitting: Internal Medicine

## 2023-07-02 DIAGNOSIS — E7800 Pure hypercholesterolemia, unspecified: Secondary | ICD-10-CM

## 2023-07-03 DIAGNOSIS — F411 Generalized anxiety disorder: Secondary | ICD-10-CM | POA: Diagnosis not present

## 2023-07-04 ENCOUNTER — Ambulatory Visit
Admission: RE | Admit: 2023-07-04 | Discharge: 2023-07-04 | Disposition: A | Discharge status: Home / Self Care | Payer: No Typology Code available for payment source | Source: Ambulatory Visit | Attending: Internal Medicine | Admitting: Internal Medicine

## 2023-07-04 DIAGNOSIS — E7800 Pure hypercholesterolemia, unspecified: Secondary | ICD-10-CM

## 2023-07-09 ENCOUNTER — Other Ambulatory Visit: Payer: Self-pay | Admitting: Internal Medicine

## 2023-07-09 DIAGNOSIS — R590 Localized enlarged lymph nodes: Secondary | ICD-10-CM

## 2023-07-16 ENCOUNTER — Ambulatory Visit
Admission: RE | Admit: 2023-07-16 | Discharge: 2023-07-16 | Disposition: A | Discharge status: Home / Self Care | Payer: Self-pay | Source: Ambulatory Visit | Attending: Internal Medicine | Admitting: Internal Medicine

## 2023-07-16 DIAGNOSIS — R590 Localized enlarged lymph nodes: Secondary | ICD-10-CM | POA: Diagnosis not present

## 2023-07-16 MED ORDER — IOPAMIDOL (ISOVUE-300) INJECTION 61%
100.0000 mL | Freq: Once | INTRAVENOUS | Status: AC | PRN
  Administered 2023-07-16: 100 mL via INTRAVENOUS

## 2023-08-09 DIAGNOSIS — Z83719 Family history of colon polyps, unspecified: Secondary | ICD-10-CM | POA: Diagnosis not present

## 2023-08-09 DIAGNOSIS — K649 Unspecified hemorrhoids: Secondary | ICD-10-CM | POA: Diagnosis not present

## 2023-08-09 DIAGNOSIS — Z1211 Encounter for screening for malignant neoplasm of colon: Secondary | ICD-10-CM | POA: Diagnosis not present

## 2023-08-09 DIAGNOSIS — K600 Acute anal fissure: Secondary | ICD-10-CM | POA: Diagnosis not present

## 2023-08-22 DIAGNOSIS — K5730 Diverticulosis of large intestine without perforation or abscess without bleeding: Secondary | ICD-10-CM | POA: Diagnosis not present

## 2023-08-22 DIAGNOSIS — Z83719 Family history of colon polyps, unspecified: Secondary | ICD-10-CM | POA: Diagnosis not present

## 2023-08-22 DIAGNOSIS — K921 Melena: Secondary | ICD-10-CM | POA: Diagnosis not present

## 2023-09-02 ENCOUNTER — Ambulatory Visit: Payer: Self-pay | Admitting: General Surgery

## 2023-09-02 DIAGNOSIS — K642 Third degree hemorrhoids: Secondary | ICD-10-CM | POA: Diagnosis not present

## 2023-09-02 NOTE — H&P (Signed)
 REFERRING PHYSICIAN:  Lanetta Inch Nic*  PROVIDER:  Elenora Gamma, MD  MRN: [id number] DOB: 1966/04/21 DATE OF ENCOUNTER: 09/02/2023  Subjective   Chief Complaint: New Consultation (Rectal prolapse/enlarged lt inguinal hernia)     History of Present Illness: [name] [name] is a 58 y.o. female who is seen today as an office consultation at the request of Dr. Florian Buff for evaluation of New Consultation (Rectal prolapse/enlarged lt inguinal lymph node) .   58 year old female with longstanding industry of prolapsing hemorrhoids who presents to the office with her mentally bleeding prolapsing hemorrhoids.  This has gotten worse over the last few months.  She also has regional pain syndrome and requires Aleve for good control, but this seems to flareup her hemorrhoids and make them bleed.  She was also noted to have an enlarged lymph node on recent physical exam.    Review of Systems: A complete review of systems was obtained from the patient.  I have reviewed this information and discussed as appropriate with the patient.  See HPI as well for other ROS.   Medical History: Past Medical History:  Diagnosis Date   Anxiety    GERD (gastroesophageal reflux disease)     There is no problem list on file for this patient.   Past Surgical History:  Procedure Laterality Date   CHOLECYSTECTOMY     RIGHT SHOULDER ARTHROSCOPY     WISDOM TEETH       Allergies  Allergen Reactions   Levaquin [Levofloxacin] Rash   Reglan [Metoclopramide] Other (See Comments)    Anxiety, confusion, and restlessness    Current Outpatient Medications on File Prior to Visit  Medication Sig Dispense Refill   ALPRAZolam (XANAX) 0.5 MG tablet Take 0.5 mg by mouth     cetirizine (ZYRTEC) 10 MG chewable tablet Take by mouth     dextroamphetamine sulfate 30 mg Tab Take by mouth     onabotulinumtoxinA (BOTOX) 100 unit injection Provider to inject 155 units into the muscles of the head and  neck every 3 months. Discard remainder.     PARoxetine (PAXIL) 30 MG tablet Take 20 mg by mouth once daily     progesterone (PROMETRIUM) 200 MG capsule progesterone micronized 200 mg capsule TAKE 1 CAPSULE (200MG ) BY MOUTH DAILY AFTER DINNER     traZODone (DESYREL) 50 MG tablet Take 75 mg by mouth     No current facility-administered medications on file prior to visit.    Family History  Problem Relation Age of Onset   High blood pressure (Hypertension) Mother    Hyperlipidemia (Elevated cholesterol) Mother    Diabetes Mother    Coronary Artery Disease (Blocked arteries around heart) Father    Skin cancer Father    Diabetes Sister      Social History   Tobacco Use  Smoking Status Never  Smokeless Tobacco Never     Social History   Socioeconomic History   Marital status: Married  Tobacco Use   Smoking status: Never   Smokeless tobacco: Never  Vaping Use   Vaping status: Never Used  Substance and Sexual Activity   Alcohol use: Yes   Drug use: Never   Social Drivers of Health   Housing Stability: Unknown (09/02/2023)   Housing Stability Vital Sign    Homeless in the Last Year: No    Objective:    Vitals:   09/02/23 1341  BP: 102/72  Pulse: (!) 118  Temp: 36.4 C (97.6 F)  SpO2: 99%  Weight:  51.9 kg (114 lb 6.4 oz)  Height: 154.9 cm (5\' 1" )  PainSc: 0-No pain     Exam Gen: NAD Abd: soft Ing: Palpable lymph node noted in left inguinal region approximately 3 cm in length   Labs, Imaging and Diagnostic Testing:  Procedure: Anoscopy Surgeon: Maisie Fus After the risks and benefits were explained, written consent was obtained for above procedure.  A medical assistant chaperone was present thoroughout the entire procedure.  Anesthesia: none Diagnosis: rectal bleeding Findings: Grade 3 right posterior right anterior hemorrhoids with active inflammation, grade 1 left lateral hemorrhoid  Assessment and Plan:  Diagnoses and all orders for this  visit:  Prolapsed internal hemorrhoids, grade 37     58 year old female who presents to the office for evaluation of prolapsing hemorrhoids and rectal bleeding.  On exam today she does have grade 3 inflamed hemorrhoids.  She has been putting off surgery due to her pain syndrome.  I think that surgery would help her with her rectal pain and bleeding in the long run.  We discussed the typical postoperative pain associated with this type of surgery.  I recommended proceeding with trans hemorrhoidal dearterialization.  We discussed the risk of recurrence as well as bleeding.  All questions were answered.  [name], MD Colon and Rectal Surgery [facility]

## 2023-09-05 ENCOUNTER — Encounter: Payer: Self-pay | Admitting: Medical Oncology

## 2023-09-09 ENCOUNTER — Inpatient Hospital Stay: Payer: Self-pay | Admitting: Physician Assistant

## 2023-09-09 ENCOUNTER — Encounter: Payer: Self-pay | Admitting: Medical Oncology

## 2023-09-09 ENCOUNTER — Inpatient Hospital Stay: Payer: Self-pay | Attending: Physician Assistant

## 2023-09-09 VITALS — BP 130/86 | HR 100 | Temp 98.6°F | Resp 16 | Ht 60.24 in | Wt 115.4 lb

## 2023-09-09 DIAGNOSIS — R590 Localized enlarged lymph nodes: Secondary | ICD-10-CM | POA: Diagnosis not present

## 2023-09-09 LAB — HEPATITIS B SURFACE ANTIGEN: Hepatitis B Surface Ag: NONREACTIVE

## 2023-09-09 LAB — CBC WITH DIFFERENTIAL (CANCER CENTER ONLY)
Abs Immature Granulocytes: 0.02 10*3/uL (ref 0.00–0.07)
Basophils Absolute: 0 10*3/uL (ref 0.0–0.1)
Basophils Relative: 1 %
Eosinophils Absolute: 0.1 10*3/uL (ref 0.0–0.5)
Eosinophils Relative: 2 %
HCT: 42.5 % (ref 36.0–46.0)
Hemoglobin: 13.6 g/dL (ref 12.0–15.0)
Immature Granulocytes: 0 %
Lymphocytes Relative: 19 %
Lymphs Abs: 1.4 10*3/uL (ref 0.7–4.0)
MCH: 29 pg (ref 26.0–34.0)
MCHC: 32 g/dL (ref 30.0–36.0)
MCV: 90.6 fL (ref 80.0–100.0)
Monocytes Absolute: 0.5 10*3/uL (ref 0.1–1.0)
Monocytes Relative: 7 %
Neutro Abs: 5.3 10*3/uL (ref 1.7–7.7)
Neutrophils Relative %: 71 %
Platelet Count: 238 10*3/uL (ref 150–400)
RBC: 4.69 MIL/uL (ref 3.87–5.11)
RDW: 11.8 % (ref 11.5–15.5)
WBC Count: 7.4 10*3/uL (ref 4.0–10.5)
nRBC: 0 % (ref 0.0–0.2)

## 2023-09-09 LAB — CMP (CANCER CENTER ONLY)
ALT: 11 U/L (ref 0–44)
AST: 16 U/L (ref 15–41)
Albumin: 4.4 g/dL (ref 3.5–5.0)
Alkaline Phosphatase: 47 U/L (ref 38–126)
Anion gap: 5 (ref 5–15)
BUN: 12 mg/dL (ref 6–20)
CO2: 33 mmol/L — ABNORMAL HIGH (ref 22–32)
Calcium: 9.5 mg/dL (ref 8.9–10.3)
Chloride: 100 mmol/L (ref 98–111)
Creatinine: 0.74 mg/dL (ref 0.44–1.00)
GFR, Estimated: >60 mL/min (ref >60)
Glucose, Bld: 87 mg/dL (ref 70–99)
Potassium: 4.1 mmol/L (ref 3.5–5.1)
Sodium: 138 mmol/L (ref 135–145)
Total Bilirubin: 0.4 mg/dL (ref 0.0–1.2)
Total Protein: 6.9 g/dL (ref 6.5–8.1)

## 2023-09-09 LAB — C-REACTIVE PROTEIN: CRP: <0.5 mg/dL (ref <1.0)

## 2023-09-09 LAB — LACTATE DEHYDROGENASE: LDH: 156 U/L (ref 98–192)

## 2023-09-09 LAB — HEPATITIS C ANTIBODY: HCV Ab: NONREACTIVE

## 2023-09-09 LAB — SEDIMENTATION RATE: Sed Rate: 6 mm/h (ref 0–22)

## 2023-09-09 NOTE — Progress Notes (Signed)
 Gilmer Mor, DO  Claudean Kinds; [place] [facility] Procedure Requests OK for US guided left inguinal lymph node biopsy.  [address]       Previous Messages    ----- Message ----- From: Claudean Kinds Sent: 09/09/2023   1:59 PM EDT To: Claudean Kinds; Ir Procedure Requests Subject: [place] CORE BIOPSY (LYMPH NODES)                  Procedure : [place] CORE BIOPSY (LYMPH NODES)  Reason: 2 cm left inguinal lyph node. Rapid diagnostic clinic Dx: Inguinal adenopathy [R59.0 (ICD-10-CM)]  Ordering Comments  Rapid diagnostic clinic pt    History : CT pelvis w/  Provider : Shanon Ace, PA-C  Provider contact :  [phone]

## 2023-09-09 NOTE — Progress Notes (Signed)
 [facility]  Patient presented to clinic, alone ,for her scheduled appointment with Benedict Needy. I introduced myself and provided patient with my direct contact information. Patient encouraged to call me with questions/concerns.  Gregary Cromer, RN, BSN, [facility] Oncology Nurse Navigator, [facility] 09/09/2023 2:14 PM

## 2023-09-09 NOTE — Progress Notes (Signed)
 Rapid [facility] Clinic [facility] Telephone:([phone]) [phone]   Fax:([phone]) [phone]  INITIAL CONSULTATION:  Patient Care Team: Tisovec, Adelfa Koh, MD as PCP - General (Internal Medicine)  CHIEF COMPLAINTS/PURPOSE OF CONSULTATION:  "Pelvic lymphadenopathy "  HISTORY OF PRESENTING ILLNESS:  KATHLEEN TAMM 58 y.o. female with medical history significant for migraines, laryngopharyngeal reflux, lung nodule, partial rectal prolapse, radial tunnel syndrome, pelvic floor dysfunction, pulmonary sarcoidosis, restless leg syndrome, ANA positive, chronic regional pain syndrome, diagnosed in 2014, insomnia.  On review of the previous records patient is being referred by PCP for enlarged pelvic lymph node.  Patient had her physical in January 2025 and PCP ordered CT pelvis for further evaluation of the enlarged pelvic lymph node.  CT scan was performed 07/16/2023 and shows enlarged left inguinal lymph node measuring 2 cm in short axis.  No other adenopathy seen within the pelvis.  Patient also had CT cardiac scoring on 07/04/2023.  Scan did not show any adenopathy.  On exam today patient reports the enlarged pelvic lymph node was first noticed around the end of October 2024 as a small lump, which has since increased in size and is now more noticeable, though it remains non-tender. She has experienced increased fatigue since October 2024 and occasional night sweats, which she attributes to sleeping with an electric blanket. There is no significant weight loss or change in appetite.   Further discussion reveals that patient is a never smoker and very rarely drinks alcohol.  Oncologic family history includes her mother had uterine cancer.  Patient had a colonoscopy earlier this month that was normal.  Her last mammogram and Pap smear were in October 2024 and also normal.  MEDICAL HISTORY:  Past Medical History:  Diagnosis Date   Anxiety    Brain fog    Chronic pain    COMPLEX REGIONAL PAIN  SYNDROME   Hemorrhoids    History of neck problems    dry needling being done & PT   Leg cramping 2020   right, says sometimes she has a hard time putting pressure on that leg    Migraine headache    Sarcoidosis     SURGICAL HISTORY: Past Surgical History:  Procedure Laterality Date   CHOLECYSTECTOMY     DILATION AND CURETTAGE OF UTERUS     ESOPHAGEAL MANOMETRY N/A 05/05/2018   Procedure: ESOPHAGEAL MANOMETRY (EM);  Surgeon: Vida Rigger, MD;  Location: [facility];  Service: Endoscopy;  Laterality: N/A;   PERINEOPLASTY     had scarring after her delivery   SHOULDER ARTHROSCOPY Left 06/11/2006   THORACOSCOPY Left    TONSILLECTOMY AND ADENOIDECTOMY     AT AGE 9   WISDOM TOOTH EXTRACTION      SOCIAL HISTORY: Social History   Socioeconomic History   Marital status: Married    Spouse name: Not on file   Number of children: Not on file   Years of education: Not on file   Highest education level: Master's degree (e.g., MA, MS, MEng, MEd, MSW, MBA)  Occupational History   Not on file  Tobacco Use   Smoking status: Never   Smokeless tobacco: Never  Vaping Use   Vaping status: Never Used  Substance and Sexual Activity   Alcohol use: Not Currently    Alcohol/week: 1.0 standard drink of alcohol    Types: 1 Standard drinks or equivalent per week   Drug use: No   Sexual activity: Yes    Partners: Male    Birth control/protection: Pill  Other Topics Concern   Not on file  Social History Narrative   Lives at home with husband and daughter   Right handed   Caffeine: 1 cup/day   Social Drivers of Corporate investment banker Strain: Not on file  Food Insecurity: Not on file  Transportation Needs: Not on file  Physical Activity: Not on file  Stress: Not on file  Social Connections: Not on file  Intimate Partner Violence: Not on file    FAMILY HISTORY: Family History  Problem Relation Age of Onset   Diabetes Mother        TYPE 2   Hypertension Mother    Cancer  Mother        UTERINE CANCER   Heart disease Father        HEART ATTACK   Migraines Father     ALLERGIES:  is allergic to metoclopramide hcl, penicillins, and reglan [metoclopramide].  MEDICATIONS:  Current Outpatient Medications  Medication Sig Dispense Refill   ALPRAZolam (XANAX) 0.5 MG tablet Take 0.5 mg by mouth at bedtime as needed for anxiety.     Amphet-Dextroamphet 3-Bead ER (MYDAYIS) 50 MG CP24 Take by mouth.     botulinum toxin Type A (BOTOX) 100 units SOLR injection Provider to inject 155 units into the muscles of the head and neck every 3 months. Discard remainder. 2 each 1   Cetirizine HCl (ZYRTEC PO) Take 10 mg by mouth daily as needed.     Cholecalciferol (VITAMIN D3 PO) Take 5,000 Int'l Units/L by mouth daily. Patient state     fluticasone (FLONASE) 50 MCG/ACT nasal spray Place 2 sprays into both nostrils as needed.     HYDROcodone-acetaminophen (NORCO) 5-325 MG tablet Take 1-2 tablets by mouth every 6 (six) hours as needed for moderate pain. 56 tablet 0   NONFORMULARY OR COMPOUNDED ITEM Dilitizine Ointment used as needed     PARoxetine (PAXIL) 30 MG tablet Take 20 mg by mouth daily.     progesterone (PROMETRIUM) 200 MG capsule Take 200 mg by mouth daily.     rizatriptan (MAXALT-MLT) 10 MG disintegrating tablet Take 1 tablet (10 mg total) by mouth as needed for migraine. May repeat in 2 hours if needed 9 tablet 11   TRAZODONE HCL PO Take 100 mg by mouth at bedtime.     UNABLE TO FIND Biotepellet  inserted every 3 months for hormone replacement     No current facility-administered medications for this visit.    REVIEW OF SYSTEMS:   All other systems are reviewed and are negative for acute change except as noted in the HPI.  PHYSICAL EXAMINATION: ECOG PERFORMANCE STATUS: 1 - Symptomatic but completely ambulatory  Vitals:   09/09/23 1248  BP: 130/86  Pulse: 100  Resp: 16  Temp: 98.6 F (37 C)  SpO2: 99%   Filed Weights   09/09/23 1248  Weight: 115 lb 6.4 oz  (52.3 kg)    Physical Exam Vitals reviewed.  Constitutional:      Appearance: She is not ill-appearing or toxic-appearing.  HENT:     Head: Normocephalic.     Nose: Nose normal.  Eyes:     Conjunctiva/sclera: Conjunctivae normal.  Cardiovascular:     Rate and Rhythm: Normal rate and regular rhythm.     Pulses: Normal pulses.     Comments: HR in the 90s during exam Pulmonary:     Effort: Pulmonary effort is normal.     Breath sounds: Normal breath sounds.  Abdominal:  Tenderness: There is no abdominal tenderness.  Musculoskeletal:        General: Normal range of motion.     Cervical back: Normal range of motion.  Lymphadenopathy:     Head:     Right side of head: No submental, submandibular, tonsillar, preauricular, posterior auricular or occipital adenopathy.     Left side of head: No submental, submandibular, tonsillar, preauricular, posterior auricular or occipital adenopathy.     Cervical: No cervical adenopathy.     Upper Body:     Right upper body: No supraclavicular, axillary or pectoral adenopathy.     Left upper body: No supraclavicular, axillary or pectoral adenopathy.     Lower Body: Left inguinal adenopathy present.  Skin:    General: Skin is warm and dry.  Neurological:     Mental Status: She is alert and oriented to person, place, and time.       LABORATORY DATA:  I have reviewed the data as listed    Latest Ref Rng & Units 09/09/2023    2:13 PM 01/23/2023   11:28 AM 10/12/2022   10:52 AM  CBC  WBC 4.0 - 10.5 K/uL 7.4  6.1  6.0   Hemoglobin 12.0 - 15.0 g/dL 82.9  56.2  13.0   Hematocrit 36.0 - 46.0 % 42.5  42.3  42.9   Platelets 150 - 400 K/uL 238  252.0  264.0        Latest Ref Rng & Units 09/09/2023    2:13 PM 01/23/2023   11:28 AM 10/12/2022   10:52 AM  CMP  Glucose 70 - 99 mg/dL 87  88  95   BUN 6 - 20 mg/dL 12  14  13    Creatinine 0.44 - 1.00 mg/dL 8.65  7.84  6.96   Sodium 135 - 145 mmol/L 138  136  138   Potassium 3.5 - 5.1 mmol/L 4.1   4.2  5.2 No hemolysis seen   Chloride 98 - 111 mmol/L 100  99  100   CO2 22 - 32 mmol/L 33  31  32   Calcium 8.9 - 10.3 mg/dL 9.5  9.5  9.8    29.5   Total Protein 6.5 - 8.1 g/dL 6.9  7.0  6.9   Total Bilirubin 0.0 - 1.2 mg/dL 0.4  0.4  0.4   Alkaline Phos 38 - 126 U/L 47  39  35   AST 15 - 41 U/L 16  19  22    ALT 0 - 44 U/L 11  13  20       RADIOGRAPHIC STUDIES: I have personally reviewed the radiological images as listed and agreed with the findings in the report. No results found.  ASSESSMENT & PLAN WAVER DIBIASIO is a 58 y.o. female presenting to the [facility] for consultation regarding left inguinal lymphadenopathy. We have reviewed etiologies including infectious process, inflammatory process, malignancy, lymphoproliferative disorder. Patient will proceed with laboratory workup today.   #Left inguinal lymphadenopathy -Reviewed CT pelvis and CT cardiac results with patient. -On exam patient has nontender palpable left inguinal lymph node. -Labs collected today include CBC, CMP, ESR, CRP, LDH, flow cytometry, Hep B & C serologies, HIV serology.  -Needle biopsy ordered for further workup.  #Age related screenings -UTD  -Patient will RTC when work up is complete.  Patient expressed understanding of the recommended workup and is agreeable to move forward.   All questions were answered. The patient knows to call the clinic with any problems,  questions or concerns.  Shared visit with Dr. Leonides Schanz.  Orders Placed This Encounter  Procedures   [place] CORE BIOPSY (LYMPH NODES)    Rapid diagnostic clinic pt    Standing Status:   Future    Expected Date:   09/16/2023    Expiration Date:   09/08/2024    Lab orders requested (DO NOT place separate lab orders, these will be automatically ordered during procedure specimen collection)::   Surgical Pathology    Reason for Exam (SYMPTOM  OR DIAGNOSIS REQUIRED):   2 cm left inguinal lyph node. Rapid diagnostic clinic     Preferred location?:   [facility]   CBC with Differential (Cancer Center Only)   CMP (Cancer Center only)   C-reactive protein   Flow Cytometry, Peripheral Blood (Oncology)   Hepatitis B core antibody, total   Hepatitis B surface antibody,qualitative   Hepatitis B surface antigen   Hepatitis C antibody   HIV Antibody (routine testing w rflx)   Lactate dehydrogenase   Sedimentation rate      I have spent a total of 60 minutes minutes of face-to-face and non-face-to-face time, preparing to see the patient, obtaining and/or reviewing separately obtained history, performing a medically appropriate examination, counseling and educating the patient, ordering medications/tests/procedures, referring and communicating with other health care professionals, documenting clinical information in the electronic health record, independently interpreting results and communicating results to the patient, and care coordination.   Namon Cirri PA-C Department of Hematology/Oncology [facility] at [facility] Phone: [phone]  I have read the above note and personally examined the patient. I agree with the assessment and plan as noted above.  Briefly Mrs. Claxton is a 58 year old female who presents for evaluation of lymphadenopathy.  She has a chronically enlarged nontender palpable left inguinal lymph node which has been present for over a month.  Given the chronicity of this lymph node recommend performing a needle biopsy.  She is not having any B symptoms such as fevers, chills, sweats.  She denies any weight loss.  She is not having any other signs or symptoms concerning for lymphadenopathy elsewhere in the body.  Pending the results of his biopsy will determine if further imaging studies are required.  Patient voiced understanding of our findings and plan moving forward.   Ulysees Barns, MD Department of Hematology/Oncology [facility] at  [facility] Phone: [phone] Pager: [phone] Email: [email or web address]

## 2023-09-09 NOTE — Patient Instructions (Signed)
 Diagnostic Clinic Office Visit Discharge Information and Instructions  Thank you for choosing [facility] for your healthcare needs.  Below is a summary of today's discussion, along with our contact information and an outline of what to expect next.  Reason for Visit:  enlarged pelvic lymph node  Proposed Diagnostic Care Plan: Labs collected today Biopsy ordered. The Interventional Radiology team will reach out to you for scheduling.   What to Expect: - Generally, when lab tests are ordered the results can take up to 1 week for results to be available.  At that point, we will contact you to discuss your results with you.  Unless there is a critical result, we will typically wait for all of your lab results to be available before contacting you. - If a biopsy is part of your Care Plan, those results can take on average 7-10 days to result.  Once results are available, we will contact you to discuss your pathology results and any next steps. - If you have additional imaging ordered, such as a CT Scan, MRI, Ultrasound, Bone Scan, or PET scan, your imaging will need to be authorized then scheduled with the earliest available appointment.  You may be asked to travel to another hospital within [facility] who has a sooner availability, please consider doing so if asked. - If you use MyChart, your results will be available to you in the MyChart portal.  Your provider will be in touch with you as soon as all of your results are available to be discussed.  Your Diagnostic Clinic Provider:  Namon Cirri PA-C and Dr. Leonides Schanz Your Diagnostic Navigator:  Chauncy Lean RN, office number [phone]  If you or your caregiver have number blocking on your cell phones, please ensure the cancer center's numbers are not blocked.  If you are not a registered MyChart user, please consider enrolling in MyChart to receive your test results and visit notes.  You can also access your discharge instructions  electronically.  MyChart also gives you an electronic means to communicate with your Care Team instead of needing to call in to the cancer center.  We appreciate you trusting [place] with your healthcare and look forward to partnering with you as we work to uncover what your potential diagnosis may be.  Please do not hesitate to reach out at any point with questions or concerns.

## 2023-09-10 LAB — HIV ANTIBODY (ROUTINE TESTING W REFLEX): HIV Screen 4th Generation wRfx: NONREACTIVE

## 2023-09-10 LAB — HEPATITIS B SURFACE ANTIBODY,QUALITATIVE: Hep B S Ab: REACTIVE — AB

## 2023-09-10 LAB — HEPATITIS B CORE ANTIBODY, TOTAL: HEP B CORE AB: NEGATIVE

## 2023-09-11 ENCOUNTER — Telehealth: Payer: Self-pay | Admitting: Physician Assistant

## 2023-09-11 LAB — FLOW CYTOMETRY

## 2023-09-11 LAB — SURGICAL PATHOLOGY

## 2023-09-11 NOTE — Telephone Encounter (Signed)
 I notified Fran Lowes by phone regarding lab results from recent [facility] visit. Labs are overall unremarkable. Patient is scheduled for a lymph node biopsy on 09/19/23 and is aware I will call her with the pathology result. All of patient's questions were answered and she expressed understanding of the plan provided.

## 2023-09-18 NOTE — Progress Notes (Signed)
 Patient for [name] [name] guided LT inguinal LN Biopsy on Thurs 09/19/23, I called and spoke with the patient on the phone and gave pre-procedure instructions. Pt was made aware to be here at 1p and check in at the [facility] registration desk. Pt stated understanding.  Called 09/18/23

## 2023-09-19 ENCOUNTER — Ambulatory Visit
Admission: RE | Admit: 2023-09-19 | Discharge: 2023-09-19 | Disposition: A | Discharge status: Home / Self Care | Source: Ambulatory Visit | Attending: Physician Assistant | Admitting: Physician Assistant

## 2023-09-19 DIAGNOSIS — I898 Other specified noninfective disorders of lymphatic vessels and lymph nodes: Secondary | ICD-10-CM | POA: Diagnosis not present

## 2023-09-19 DIAGNOSIS — R590 Localized enlarged lymph nodes: Secondary | ICD-10-CM | POA: Diagnosis not present

## 2023-09-19 DIAGNOSIS — R9389 Abnormal findings on diagnostic imaging of other specified body structures: Secondary | ICD-10-CM | POA: Diagnosis not present

## 2023-09-19 MED ORDER — LIDOCAINE HCL (PF) 1 % IJ SOLN
10.0000 mL | Freq: Once | INTRAMUSCULAR | Status: AC
  Administered 2023-09-19: 10 mL via SUBCUTANEOUS
  Filled 2023-09-19: qty 10

## 2023-09-19 NOTE — Procedures (Signed)
 Interventional Radiology Procedure Note  Procedure: US guided biopsy of left inguinal lymph node.  Complications: None EBL: None Recommendations:   - Routine wound care - Follow up pathology - Advance diet  - continue home meds  Signed,  Gilmer Mor, DO

## 2023-09-23 ENCOUNTER — Telehealth: Payer: Self-pay | Admitting: Physician Assistant

## 2023-09-23 ENCOUNTER — Encounter: Payer: Self-pay | Admitting: Physician Assistant

## 2023-09-23 DIAGNOSIS — R590 Localized enlarged lymph nodes: Secondary | ICD-10-CM

## 2023-09-23 LAB — SURGICAL PATHOLOGY

## 2023-09-23 NOTE — Telephone Encounter (Signed)
 I notified [name] [name] by phone regarding lymph node results. 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  After discussing results with Dr. Rosaline Coma the recommendation is for a PET scan to further evaluate lymphadenopathy and potential excisional biopsy site.  Patient is agreeable with this plan.  Stat PET scan has been ordered. All of patient's questions were answered and she expressed understanding of the plan provided.

## 2023-09-24 ENCOUNTER — Encounter: Payer: Self-pay | Admitting: Medical Oncology

## 2023-09-25 ENCOUNTER — Encounter (HOSPITAL_COMMUNITY)
Admission: RE | Admit: 2023-09-25 | Discharge: 2023-09-25 | Disposition: A | Discharge status: Home / Self Care | Source: Ambulatory Visit | Attending: Physician Assistant | Admitting: Physician Assistant

## 2023-09-25 DIAGNOSIS — C8305 Small cell B-cell lymphoma, lymph nodes of inguinal region and lower limb: Secondary | ICD-10-CM | POA: Insufficient documentation

## 2023-09-25 DIAGNOSIS — R600 Localized edema: Secondary | ICD-10-CM | POA: Insufficient documentation

## 2023-09-25 DIAGNOSIS — R590 Localized enlarged lymph nodes: Secondary | ICD-10-CM | POA: Diagnosis present

## 2023-09-25 DIAGNOSIS — C884 Extranodal marginal zone b-cell lymphoma of mucosa-associated lymphoid tissue (malt-lymphoma) not having achieved remission: Secondary | ICD-10-CM | POA: Diagnosis not present

## 2023-09-25 DIAGNOSIS — I700 Atherosclerosis of aorta: Secondary | ICD-10-CM | POA: Insufficient documentation

## 2023-09-25 LAB — GLUCOSE, CAPILLARY: Glucose-Capillary: 88 mg/dL (ref 70–99)

## 2023-09-25 MED ORDER — FLUDEOXYGLUCOSE F - 18 (FDG) INJECTION
5.6000 | Freq: Once | INTRAVENOUS | Status: AC
  Administered 2023-09-25: 5.6 via INTRAVENOUS

## 2023-09-26 ENCOUNTER — Telehealth: Payer: Self-pay | Admitting: Physician Assistant

## 2023-09-26 NOTE — Telephone Encounter (Signed)
 I notified [name] [name] by phone regarding PET scan results. 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 Per discussion with Dr. Rosaline Coma an excisional biopsy is needed to confirm this. A message has been sent to [facility] requesting an urgent consult. All of patient's questions were answered and she expressed understanding of the plan provided.

## 2023-09-27 ENCOUNTER — Other Ambulatory Visit (HOSPITAL_COMMUNITY)

## 2023-09-30 ENCOUNTER — Other Ambulatory Visit: Payer: Self-pay | Admitting: Physician Assistant

## 2023-09-30 ENCOUNTER — Encounter: Payer: Self-pay | Admitting: Medical Oncology

## 2023-09-30 DIAGNOSIS — R590 Localized enlarged lymph nodes: Secondary | ICD-10-CM

## 2023-09-30 NOTE — Progress Notes (Signed)
 [facility]  Referral faxed to [facility] Surgery. Esperanza Hedges, RN, BSN, [facility] Oncology Nurse Navigator, [facility] 09/30/2023 2:11 PM

## 2023-09-30 NOTE — Progress Notes (Signed)
 Referral ordered for urgent general surgery consult for excisional biopsy.

## 2023-10-04 ENCOUNTER — Other Ambulatory Visit: Payer: Self-pay | Admitting: General Surgery

## 2023-10-04 DIAGNOSIS — R590 Localized enlarged lymph nodes: Secondary | ICD-10-CM | POA: Diagnosis not present

## 2023-10-09 ENCOUNTER — Ambulatory Visit (INDEPENDENT_AMBULATORY_CARE_PROVIDER_SITE_OTHER): Payer: Medicare HMO | Admitting: Adult Health

## 2023-10-09 ENCOUNTER — Ambulatory Visit: Payer: Medicare HMO | Admitting: Adult Health

## 2023-10-09 DIAGNOSIS — G43711 Chronic migraine without aura, intractable, with status migrainosus: Secondary | ICD-10-CM

## 2023-10-09 MED ORDER — ONABOTULINUMTOXINA 200 UNITS IJ SOLR
155.0000 [IU] | Freq: Once | INTRAMUSCULAR | Status: AC
  Administered 2023-10-09: 155 [IU] via INTRAMUSCULAR

## 2023-10-09 NOTE — Progress Notes (Signed)
 Botox - 200 units x 1 vial Lot: [id number] Expiration: 09/2025 NDC: 7846-9629-52  Bacteriostatic 0.9% Sodium Chloride - 5 mL  Lot: [id number] Expiration: 04/11/2024 NDC: 4010-2725-36  Dx: U44.034 B/B Witnessed by Inocencio Mania, RN

## 2023-10-09 NOTE — Progress Notes (Signed)
 10/09/23: headaches have been good. Reports neck pain was better with botox  but took about 2 weeks to work. Has rizatriptan  but doesn't use. Ice and OTC meds work best for her.  Has a swollen lymph in the groin- she goes next Wednesday 5/8 to have surgery removed. Bisopy showed marginal zone lymphoma.   06/26/23: Botox  working well. No migraines. May get a sinus headaches. Continues to have neck-shoulder pain. Pain worse by the end of the day. Had MRI cervical spine in 2020.  03/20/23: Botox  working well. Shoulders must better this time  12/20/22: Botox  working well. Continues to have neck pain. Seeing sports medicine and doing dry needling. Hurts more on the right than left.   09/18/2022: still doing well > 70% improvement in migraine freq and severity and headache as well. Tightness in neck, tried muscle relaxers, conservative measures, dryneedling: send to dr Felipe Horton and see if he can help.     06/19/2022: botox  working well. Reports neck stiffnes and should tightness left > right   BOTOX  PROCEDURE NOTE FOR MIGRAINE HEADACHE    Contraindications and precautions discussed with patient(above). Aseptic procedure was observed and patient tolerated procedure. Procedure performed by Clem Currier, NP  The condition has existed for more than 6 months, and pt does not have a diagnosis of ALS, Myasthenia Gravis or Lambert-Eaton Syndrome.  Risks and benefits of injections discussed and pt agrees to proceed with the procedure.  Written consent obtained  These injections are medically necessary. These injections do not cause sedations or hallucinations which the oral therapies may cause.  Indication/Diagnosis: chronic migraine BOTOX ([id number]) injection was performed according to protocol by [employer]. 200 units of BOTOX  was dissolved into 4 cc NS.   NDC: [address]-19  Type of toxin: Botox  Botox - 200 units x 1 vial Lot: [id number] Expiration: 09/2025 NDC: 1478-2956-21   Bacteriostatic 0.9% Sodium  Chloride- 5 mL  Lot: [id number] Expiration: 04/11/2024 NDC: 8469-6295-28   Dx: U13.244       Description of procedure:  The patient was placed in a sitting position. The standard protocol was used for Botox  as follows, with 5 units of Botox  injected at each site:   -Procerus muscle, midline injection  -Corrugator muscle, bilateral injection  -Frontalis muscle, bilateral injection, with 2 sites each side, medial injection was performed in the upper one third of the frontalis muscle, in the region vertical from the medial inferior edge of the superior orbital rim. The lateral injection was again in the upper one third of the forehead vertically above the lateral limbus of the cornea, 1.5 cm lateral to the medial injection site.  -Temporalis muscle injection, 4 sites, bilaterally. The first injection was 3 cm above the tragus of the ear, second injection site was 1.5 cm to 3 cm up from the first injection site in line with the tragus of the ear. The third injection site was 1.5-3 cm forward between the first 2 injection sites. The fourth injection site was 1.5 cm posterior to the second injection site.  -Occipitalis muscle injection, 3 sites, bilaterally. The first injection was done one half way between the occipital protuberance and the tip of the mastoid process behind the ear. The second injection site was done lateral and superior to the first, 1 fingerbreadth from the first injection. The third injection site was 1 fingerbreadth superiorly and medially from the first injection site.  -Cervical paraspinal muscle injection, 2 sites, bilateral knee first injection site was 1 cm from the midline of the cervical spine,  3 cm inferior to the lower border of the occipital protuberance. The second injection site was 1.5 cm superiorly and laterally to the first injection site.  -Trapezius muscle injection was performed at 3 sites, bilaterally. The first injection site was in the upper trapezius muscle  halfway between the inflection point of the neck, and the acromion. The second injection site was one half way between the acromion and the first injection site. The third injection was done between the first injection site and the inflection point of the neck. One injection slighty below the third injection (3 units) bilaterally.   Will return for repeat injection in 3 months.   A 200 units of Botox  was used, 161 units were injected, the rest of the Botox  was wasted. The patient tolerated the procedure well, there were no complications of the above procedure.  Clem Currier, MSN, NP-C 10/09/2023, 11:52 AM [facility] [address] [place], [place] [address] [phone]

## 2023-10-10 ENCOUNTER — Other Ambulatory Visit: Payer: Self-pay

## 2023-10-10 ENCOUNTER — Encounter (HOSPITAL_BASED_OUTPATIENT_CLINIC_OR_DEPARTMENT_OTHER): Payer: Self-pay | Admitting: General Surgery

## 2023-10-10 DIAGNOSIS — M79642 Pain in left hand: Secondary | ICD-10-CM | POA: Diagnosis not present

## 2023-10-10 NOTE — Progress Notes (Signed)

## 2023-10-14 NOTE — H&P (Signed)
 Subjective [name] [name] is a 58 y.o. female who presents for new cancer (New cancer excisional bx needed left inguinal lymphadenopathy ) HPI History of Present Illness The patient, with a known history of sarcoidosis and chronic regional pain syndrome, presents with a growing lump in the groin area. The lump was first noticed in late November or December and has been gradually increasing in size. The patient initially dismissed it as an enlarged lymph node, expecting it to resolve on its own. However, the lump continued to grow, prompting medical attention. The patient reports feeling pressure in the area of the lump, particularly during bowel movements. The patient also mentions increased fatigue but denies any significant weight loss or changes in appetite. The patient has a history of hemorrhoids, which have been problematic due to bleeding when taking non-steroidal anti-inflammatory drugs (NSAIDs). The patient also mentions a history of chronic regional pain syndrome, which has resulted in long-lasting pain following surgeries and childbirth.   Review of Systems  All other systems reviewed and are negative.     Problem List     Patient Active Problem List  Diagnosis   Chronic migraine without aura, with intractable migraine, so stated, with status migrainosus   Fatigue   Internal hemorrhoids   Sarcoidosis   Lymphadenopathy, inguinal        Medications Prior to Visit        Outpatient Medications Prior to Visit  Medication Sig Dispense Refill   ALPRAZolam (XANAX) 0.5 MG tablet Take 0.5 mg by mouth       cetirizine (ZYRTEC) 10 MG chewable tablet Take by mouth       dextroamphetamine sulfate 30 mg Tab Take by mouth       onabotulinumtoxinA  (BOTOX ) 100 unit injection Provider to inject 155 units into the muscles of the head and neck every 3 months. Discard remainder.       PARoxetine (PAXIL) 30 MG tablet Take 20 mg by mouth once daily       progesterone (PROMETRIUM) 200 MG capsule  progesterone micronized 200 mg capsule TAKE 1 CAPSULE (200MG ) BY MOUTH DAILY AFTER DINNER       traZODone (DESYREL) 50 MG tablet Take 75 mg by mouth        No facility-administered medications prior to visit.              Objective    Vitals:    10/04/23 0955  BP: 112/78  Pulse: 100  Temp: 36.6 C (97.8 F)  SpO2: 95%  Weight: 52.1 kg (114 lb 12.8 oz)  PainSc: 0-No pain    Body mass index is 21.69 kg/m.     Head:   Normocephalic and atraumatic.  Mouth/Throat: Oropharynx is clear and moist. No oropharyngeal exudate.  Eyes:   Conjunctivae are normal. Pupils are equal, round, and reactive to light. No scleral icterus.  Neck:   Normal range of motion. Neck supple. No tracheal deviation present. No thyromegaly present.  Cardiovascular: Normal rate, regular rhythm, normal heart sounds and intact distal pulses.  Exam reveals no gallop and no friction rub.   No murmur heard. Respiratory: Effort normal and breath sounds normal. No respiratory distress. No wheezes, rales or rhonchi.  No chest wall tenderness.  GI:       Soft. Bowel sounds are normal. Abdomen is soft, non tender, non distended.  No masses or hepatosplenomegaly is present. There is no rebound and no guarding.  Musculoskeletal: . Extremities are non tender and without deformity.  Lymphadenopathy:  No cervical or axillary adenopathy.  Neurological: Alert and oriented to person, place, and time. Coordination normal.  Skin:    Skin is warm and dry. No rash noted. No diaphoresis. No erythema. No pallor.  Psychiatric: Normal mood and affect.Behavior is normal. Judgment and thought content normal.    Results RADIOLOGY PET scan: Enlarged lymph node with increased size compared to CT scan (09/2023) 09/25/23 IMPRESSION: 1. The previously biopsied enlarged left inguinal lymph node is hypermetabolic, consistent with biopsy-proven marginal zone lymphoma. 2. No other hypermetabolic lymph nodes identified within the neck, chest,  abdomen or pelvis. No evidence of extranodal disease. 3. Mild subcutaneous edema within the right buttocks with low level metabolic activity, likely posttraumatic. 4.  Aortic Atherosclerosis (ICD10-I70.0).   PATHOLOGY Biopsy: Marginal zone lymphoma (09/2023)         Assessment/Plan:    Assessment & Plan Lymphoma Enlarging groin lymph node, biopsy suggests marginal zone lymphoma. Further tissue analysis needed to confirm diagnosis and rule out sarcoidosis. Surgery planned for excision and pathological examination. - Schedule lymph node excision surgery for the week of May 6th. - Perform local anesthesia prior to surgery to mitigate chronic regional pain syndrome effects. - Send excised lymph node for pathological examination to confirm diagnosis and guide further treatment. - Coordinate with oncologist Dr. Rosaline Coma for post-surgical management and follow-up.   Hemorrhoids Significant discomfort during bowel movements, history of bleeding exacerbated by NSAIDs. - Postpone hemorrhoid surgery until after lymph node excision and diagnosis. - Avoid NSAIDs like Aleve due to exacerbation of hemorrhoid bleeding.   Chronic regional pain syndrome Affects foot due to previous injury, concern for pain exacerbation post-surgery. - Administer local anesthesia prior to lymph node excision to prevent pain exacerbation. Diagnoses and all orders for this visit:   Lymphadenopathy, inguinal

## 2023-10-15 DIAGNOSIS — R350 Frequency of micturition: Secondary | ICD-10-CM | POA: Diagnosis not present

## 2023-10-15 DIAGNOSIS — D869 Sarcoidosis, unspecified: Secondary | ICD-10-CM | POA: Diagnosis not present

## 2023-10-15 DIAGNOSIS — K649 Unspecified hemorrhoids: Secondary | ICD-10-CM | POA: Diagnosis not present

## 2023-10-15 DIAGNOSIS — J302 Other seasonal allergic rhinitis: Secondary | ICD-10-CM | POA: Diagnosis not present

## 2023-10-15 DIAGNOSIS — K5900 Constipation, unspecified: Secondary | ICD-10-CM | POA: Diagnosis not present

## 2023-10-15 DIAGNOSIS — R590 Localized enlarged lymph nodes: Secondary | ICD-10-CM | POA: Diagnosis not present

## 2023-10-15 DIAGNOSIS — R102 Pelvic and perineal pain: Secondary | ICD-10-CM | POA: Diagnosis not present

## 2023-10-16 ENCOUNTER — Ambulatory Visit (HOSPITAL_BASED_OUTPATIENT_CLINIC_OR_DEPARTMENT_OTHER): Payer: Self-pay | Admitting: Anesthesiology

## 2023-10-16 ENCOUNTER — Encounter (HOSPITAL_BASED_OUTPATIENT_CLINIC_OR_DEPARTMENT_OTHER)
Admission: RE | Disposition: A | Discharge status: Home / Self Care | Payer: Self-pay | Source: Home / Self Care | Attending: General Surgery

## 2023-10-16 ENCOUNTER — Ambulatory Visit (HOSPITAL_BASED_OUTPATIENT_CLINIC_OR_DEPARTMENT_OTHER)
Admission: RE | Admit: 2023-10-16 | Discharge: 2023-10-16 | Disposition: A | Discharge status: Home / Self Care | Attending: General Surgery | Admitting: General Surgery

## 2023-10-16 ENCOUNTER — Other Ambulatory Visit: Payer: Self-pay

## 2023-10-16 ENCOUNTER — Encounter (HOSPITAL_BASED_OUTPATIENT_CLINIC_OR_DEPARTMENT_OTHER): Payer: Self-pay | Admitting: General Surgery

## 2023-10-16 DIAGNOSIS — I898 Other specified noninfective disorders of lymphatic vessels and lymph nodes: Secondary | ICD-10-CM | POA: Diagnosis not present

## 2023-10-16 DIAGNOSIS — R590 Localized enlarged lymph nodes: Secondary | ICD-10-CM | POA: Insufficient documentation

## 2023-10-16 DIAGNOSIS — G9050 Complex regional pain syndrome I, unspecified: Secondary | ICD-10-CM | POA: Diagnosis not present

## 2023-10-16 DIAGNOSIS — D869 Sarcoidosis, unspecified: Secondary | ICD-10-CM | POA: Insufficient documentation

## 2023-10-16 HISTORY — PX: LYMPH NODE BIOPSY: SHX201

## 2023-10-16 SURGERY — LYMPH NODE BIOPSY
Anesthesia: General | Site: Groin | Laterality: Left

## 2023-10-16 MED ORDER — 0.9 % SODIUM CHLORIDE (POUR BTL) OPTIME
TOPICAL | Status: DC | PRN
Start: 2023-10-16 — End: 2023-10-16
  Administered 2023-10-16: 1000 mL

## 2023-10-16 MED ORDER — LIDOCAINE HCL (CARDIAC) PF 100 MG/5ML IV SOSY
PREFILLED_SYRINGE | INTRAVENOUS | Status: DC | PRN
  Administered 2023-10-16: 60 mg via INTRAVENOUS

## 2023-10-16 MED ORDER — OXYCODONE HCL 5 MG PO TABS
ORAL_TABLET | ORAL | Status: AC
  Filled 2023-10-16: qty 1

## 2023-10-16 MED ORDER — GABAPENTIN 100 MG PO CAPS
100.0000 mg | ORAL_CAPSULE | ORAL | Status: AC
  Administered 2023-10-16: 100 mg via ORAL

## 2023-10-16 MED ORDER — AMISULPRIDE (ANTIEMETIC) 5 MG/2ML IV SOLN: 10.0000 mg | Freq: Once | INTRAVENOUS | Status: DC | PRN

## 2023-10-16 MED ORDER — ACETAMINOPHEN 500 MG PO TABS
1000.0000 mg | ORAL_TABLET | ORAL | Status: AC
  Administered 2023-10-16: 1000 mg via ORAL

## 2023-10-16 MED ORDER — OXYCODONE HCL 5 MG PO TABS: 5.0000 mg | ORAL_TABLET | Freq: Four times a day (QID) | ORAL | 0 refills | Status: DC | PRN

## 2023-10-16 MED ORDER — ONDANSETRON 4 MG PO TBDP: 4.0000 mg | ORAL_TABLET | Freq: Three times a day (TID) | ORAL | 0 refills | Status: DC | PRN

## 2023-10-16 MED ORDER — CIPROFLOXACIN IN D5W 400 MG/200ML IV SOLN
400.0000 mg | INTRAVENOUS | Status: AC
  Administered 2023-10-16: 400 mg via INTRAVENOUS

## 2023-10-16 MED ORDER — BUPIVACAINE HCL (PF) 0.25 % IJ SOLN
INTRAMUSCULAR | Status: AC
  Filled 2023-10-16: qty 30

## 2023-10-16 MED ORDER — FENTANYL CITRATE (PF) 100 MCG/2ML IJ SOLN
INTRAMUSCULAR | Status: AC
Start: 2023-10-16 — End: ?
  Filled 2023-10-16: qty 2

## 2023-10-16 MED ORDER — LIDOCAINE-EPINEPHRINE (PF) 1 %-1:200000 IJ SOLN
INTRAMUSCULAR | Status: DC | PRN
  Administered 2023-10-16: 30 mL

## 2023-10-16 MED ORDER — PHENYLEPHRINE HCL (PRESSORS) 10 MG/ML IV SOLN
INTRAVENOUS | Status: DC | PRN
  Administered 2023-10-16: 80 ug via INTRAVENOUS

## 2023-10-16 MED ORDER — OXYCODONE HCL 5 MG/5ML PO SOLN: 5.0000 mg | Freq: Once | ORAL | Status: AC | PRN

## 2023-10-16 MED ORDER — PROPOFOL 10 MG/ML IV BOLUS
INTRAVENOUS | Status: DC | PRN
  Administered 2023-10-16: 160 mg via INTRAVENOUS

## 2023-10-16 MED ORDER — DEXAMETHASONE SODIUM PHOSPHATE 4 MG/ML IJ SOLN
INTRAMUSCULAR | Status: DC | PRN
  Administered 2023-10-16: 4 mg via INTRAVENOUS

## 2023-10-16 MED ORDER — ONDANSETRON HCL 4 MG/2ML IJ SOLN
INTRAMUSCULAR | Status: DC | PRN
  Administered 2023-10-16: 4 mg via INTRAVENOUS

## 2023-10-16 MED ORDER — MIDAZOLAM HCL 2 MG/2ML IJ SOLN
INTRAMUSCULAR | Status: AC
  Filled 2023-10-16: qty 2

## 2023-10-16 MED ORDER — CIPROFLOXACIN IN D5W 400 MG/200ML IV SOLN
INTRAVENOUS | Status: AC
  Filled 2023-10-16: qty 200

## 2023-10-16 MED ORDER — ACETAMINOPHEN 500 MG PO TABS
ORAL_TABLET | ORAL | Status: AC
  Filled 2023-10-16: qty 2

## 2023-10-16 MED ORDER — OXYCODONE HCL 5 MG PO TABS
5.0000 mg | ORAL_TABLET | Freq: Once | ORAL | Status: AC | PRN
  Administered 2023-10-16: 5 mg via ORAL

## 2023-10-16 MED ORDER — LACTATED RINGERS IV SOLN: INTRAVENOUS | Status: DC

## 2023-10-16 MED ORDER — CHLORHEXIDINE GLUCONATE CLOTH 2 % EX PADS: 6.0000 | MEDICATED_PAD | Freq: Once | CUTANEOUS | Status: DC

## 2023-10-16 MED ORDER — GABAPENTIN 100 MG PO CAPS
ORAL_CAPSULE | ORAL | Status: AC
  Filled 2023-10-16: qty 1

## 2023-10-16 MED ORDER — FENTANYL CITRATE (PF) 100 MCG/2ML IJ SOLN
INTRAMUSCULAR | Status: DC | PRN
  Administered 2023-10-16: 50 ug via INTRAVENOUS

## 2023-10-16 MED ORDER — SODIUM CHLORIDE 0.9 % IV SOLN: 12.5000 mg | INTRAVENOUS | Status: DC | PRN

## 2023-10-16 MED ORDER — FENTANYL CITRATE (PF) 100 MCG/2ML IJ SOLN
INTRAMUSCULAR | Status: AC
  Filled 2023-10-16: qty 2

## 2023-10-16 MED ORDER — MIDAZOLAM HCL 5 MG/5ML IJ SOLN
INTRAMUSCULAR | Status: DC | PRN
  Administered 2023-10-16: 2 mg via INTRAVENOUS

## 2023-10-16 MED ORDER — FENTANYL CITRATE (PF) 100 MCG/2ML IJ SOLN
25.0000 ug | INTRAMUSCULAR | Status: DC | PRN
  Administered 2023-10-16: 25 ug via INTRAVENOUS

## 2023-10-16 SURGICAL SUPPLY — 39 items
BLADE HEX COATED 2.75 (ELECTRODE) ×1 IMPLANT
BLADE SURG 10 STRL SS (BLADE) IMPLANT
BLADE SURG 15 STRL LF DISP TIS (BLADE) ×1 IMPLANT
CANISTER SUCT 1200ML W/VALVE (MISCELLANEOUS) IMPLANT
CHLORAPREP W/TINT 26 (MISCELLANEOUS) ×1 IMPLANT
COVER BACK TABLE 60X90IN (DRAPES) ×1 IMPLANT
COVER MAYO STAND STRL (DRAPES) ×1 IMPLANT
DERMABOND ADVANCED .7 DNX12 (GAUZE/BANDAGES/DRESSINGS) ×1 IMPLANT
DRAPE LAPAROTOMY 100X72 PEDS (DRAPES) ×1 IMPLANT
DRAPE UTILITY XL STRL (DRAPES) ×1 IMPLANT
ELECT COATED BLADE 2.86 ST (ELECTRODE) IMPLANT
ELECTRODE REM PT RTRN 9FT ADLT (ELECTROSURGICAL) ×1 IMPLANT
GLOVE BIO SURGEON STRL SZ 6 (GLOVE) ×1 IMPLANT
GLOVE BIOGEL PI IND STRL 6.5 (GLOVE) ×1 IMPLANT
GLOVE BIOGEL PI IND STRL 7.5 (GLOVE) IMPLANT
GLOVE SURG SYN 7.5 E (GLOVE) ×1 IMPLANT
GLOVE SURG SYN 7.5 PF PI (GLOVE) IMPLANT
GOWN STRL REUS W/ TWL LRG LVL3 (GOWN DISPOSABLE) ×1 IMPLANT
GOWN STRL REUS W/ TWL XL LVL3 (GOWN DISPOSABLE) ×1 IMPLANT
HEMOSTAT ARISTA ABSORB 3G PWDR (HEMOSTASIS) IMPLANT
NDL HYPO 25X1 1.5 SAFETY (NEEDLE) ×1 IMPLANT
NEEDLE HYPO 25X1 1.5 SAFETY (NEEDLE) ×1 IMPLANT
NS IRRIG 1000ML POUR BTL (IV SOLUTION) IMPLANT
PACK BASIN DAY SURGERY FS (CUSTOM PROCEDURE TRAY) ×1 IMPLANT
PENCIL SMOKE EVACUATOR (MISCELLANEOUS) ×1 IMPLANT
SLEEVE SCD COMPRESS KNEE MED (STOCKING) IMPLANT
SPIKE FLUID TRANSFER (MISCELLANEOUS) IMPLANT
SPONGE T-LAP 18X18 ~~LOC~~+RFID (SPONGE) ×1 IMPLANT
STAPLER SKIN PROX WIDE 3.9 (STAPLE) IMPLANT
SUT MON AB 4-0 PC3 18 (SUTURE) IMPLANT
SUT VIC AB 2-0 SH 27XBRD (SUTURE) IMPLANT
SUT VIC AB 3-0 54X BRD REEL (SUTURE) IMPLANT
SUT VIC AB 3-0 SH 27X BRD (SUTURE) IMPLANT
SUT VICRYL 3-0 CR8 SH (SUTURE) IMPLANT
SYR BULB EAR ULCER 3OZ GRN STR (SYRINGE) IMPLANT
SYR CONTROL 10ML LL (SYRINGE) ×1 IMPLANT
TOWEL GREEN STERILE FF (TOWEL DISPOSABLE) ×1 IMPLANT
TUBE CONNECTING 20X1/4 (TUBING) IMPLANT
YANKAUER SUCT BULB TIP NO VENT (SUCTIONS) IMPLANT

## 2023-10-16 NOTE — Interval H&P Note (Signed)
 History and Physical Interval Note:  10/16/2023 1:10 PM  [name] [name]  has presented today for surgery, with the diagnosis of LEFT INGUINAL LYMPH NODE EXCISIONAL BIOPSY.  The various methods of treatment have been discussed with the patient and family. After consideration of risks, benefits and other options for treatment, the patient has consented to  Procedure(s) with comments: LYMPH NODE BIOPSY (Left) - LEFT INGUINAL LYMPH NODE EXCISIONAL BIOPSY as a surgical intervention.  The patient's history has been reviewed, patient examined, no change in status, stable for surgery.  I have reviewed the patient's chart and labs.  Questions were answered to the patient's satisfaction.     Lockie Rima

## 2023-10-16 NOTE — Anesthesia Preprocedure Evaluation (Addendum)
 Anesthesia Evaluation  Patient identified by MRN, date of birth, ID band Patient awake    Reviewed: Allergy  & Precautions, NPO status , Patient's Chart, lab work & pertinent test results  History of Anesthesia Complications Negative for: history of anesthetic complications  Airway Mallampati: II  TM Distance: >3 FB Neck ROM: Full    Dental  (+) Dental Advisory Given, Teeth Intact   Pulmonary neg pulmonary ROS   Pulmonary exam normal        Cardiovascular negative cardio ROS Normal cardiovascular exam     Neuro/Psych  Headaches PSYCHIATRIC DISORDERS Anxiety      "Brain fog"   Neuromuscular disease    GI/Hepatic negative GI ROS, Neg liver ROS,,,  Endo/Other  negative endocrine ROS    Renal/GU negative Renal ROS     Musculoskeletal negative musculoskeletal ROS (+)    Abdominal   Peds  Hematology negative hematology ROS (+)   Anesthesia Other Findings COMPLEX REGIONAL PAIN SYNDROME Sarcoidosis   Reproductive/Obstetrics                             Anesthesia Physical Anesthesia Plan  ASA: 2  Anesthesia Plan: General   Post-op Pain Management: Tylenol  PO (pre-op)* and Minimal or no pain anticipated   Induction: Intravenous  PONV Risk Score and Plan: 3 and Treatment may vary due to age or medical condition, Ondansetron, Dexamethasone and Midazolam  Airway Management Planned: LMA  Additional Equipment: None  Intra-op Plan:   Post-operative Plan: Extubation in OR  Informed Consent: I have reviewed the patients History and Physical, chart, labs and discussed the procedure including the risks, benefits and alternatives for the proposed anesthesia with the patient or authorized representative who has indicated his/her understanding and acceptance.     Dental advisory given  Plan Discussed with: CRNA and Anesthesiologist  Anesthesia Plan Comments:        Anesthesia Quick  Evaluation

## 2023-10-16 NOTE — Transfer of Care (Signed)
 Immediate Anesthesia Transfer of Care Note  Patient: PAYSLEY VOLKMER  Procedure(s) Performed: LYMPH NODE BIOPSY (Left: Groin)  Patient Location: PACU  Anesthesia Type:General  Level of Consciousness: awake, alert , and oriented  Airway & Oxygen Therapy: Patient Spontanous Breathing and Patient connected to face mask oxygen  Post-op Assessment: Report given to RN and Post -op Vital signs reviewed and stable  Post vital signs: Reviewed and stable  Last Vitals:  Vitals Value Taken Time  BP 128/84 10/16/23 1436  Temp    Pulse    Resp 17 10/16/23 1437  SpO2    Vitals shown include unfiled device data.  Last Pain:  Vitals:   10/16/23 1221  TempSrc: Temporal  PainSc: 0-No pain         Complications: No notable events documented.

## 2023-10-16 NOTE — Anesthesia Procedure Notes (Signed)
 Procedure Name: LMA Insertion Date/Time: 10/16/2023 1:32 PM  Performed by: Eugenia Hess, CRNAPre-anesthesia Checklist: Patient identified, Emergency Drugs available, Suction available and Patient being monitored Patient Re-evaluated:Patient Re-evaluated prior to induction Oxygen Delivery Method: Circle System Utilized Preoxygenation: Pre-oxygenation with 100% oxygen Induction Type: IV induction Ventilation: Mask ventilation without difficulty LMA: LMA inserted LMA Size: 3.0 Number of attempts: 1 Airway Equipment and Method: bite block Placement Confirmation: positive ETCO2 Tube secured with: Tape Dental Injury: Teeth and Oropharynx as per pre-operative assessment

## 2023-10-16 NOTE — Discharge Instructions (Addendum)
 [facility] Office Phone Number [phone]   POST OP INSTRUCTIONS  Always review your discharge instruction sheet given to you by the facility where your surgery was performed.  IF YOU HAVE DISABILITY OR FAMILY LEAVE FORMS, YOU MUST BRING THEM TO THE OFFICE FOR PROCESSING.  DO NOT GIVE THEM TO YOUR DOCTOR.  Take 2 tylenol  (acetominophen) three times a day for 3 days.  If you still have pain, add ibuprofen with food in between if able to take this (if you have kidney issues or stomach issues, do not take ibuprofen).  If both of those are not enough, add the narcotic pain pill.  If you find you are needing a lot of this overnight after surgery, call the next morning for a refill.   Take your usually prescribed medications unless otherwise directed If you need a refill on your pain medication, please contact your pharmacy.  They will contact our office to request authorization.  Prescriptions will not be filled after 5pm or on week-ends. You should eat very light the first 24 hours after surgery, such as soup, crackers, pudding, etc.  Resume your normal diet the day after surgery It is common to experience some constipation if taking pain medication after surgery.  Increasing fluid intake and taking a stool softener will usually help or prevent this problem from occurring.  A mild laxative (Milk of Magnesia or Miralax) should be taken according to package directions if there are no bowel movements after 48 hours. You may shower in 48 hours.  The surgical glue will flake off in 2-3 weeks.   ACTIVITIES:  No strenuous activity or heavy lifting for 1 week.   You may drive when you no longer are taking prescription pain medication, you can comfortably wear a seatbelt, and you can safely maneuver your car and apply brakes. RETURN TO WORK:  __________1 week if applicable_______________ [name] [name] should see your doctor in the office for a follow-up appointment approximately three-four weeks after  your surgery.    WHEN TO CALL YOUR DOCTOR: Fever over 101.0 Nausea and/or vomiting. Extreme swelling or bruising. Continued bleeding from incision. Increased pain, redness, or drainage from the incision.  The clinic staff is available to answer your questions during regular business hours.  Please don't hesitate to call and ask to speak to one of the nurses for clinical concerns.  If you have a medical emergency, go to the nearest emergency room or call 911.  A surgeon from [facility] is always on call at the hospital.  For further questions, please visit [email or web address]    Post Anesthesia Home Care Instructions  Activity: Get plenty of rest for the remainder of the day. A responsible individual must stay with you for 24 hours following the procedure.  For the next 24 hours, DO NOT: -Drive a car -Advertising copywriter -Drink alcoholic beverages -Take any medication unless instructed by your physician -Make any legal decisions or sign important papers.  Meals: Start with liquid foods such as gelatin or soup. Progress to regular foods as tolerated. Avoid greasy, spicy, heavy foods. If nausea and/or vomiting occur, drink only clear liquids until the nausea and/or vomiting subsides. Call your physician if vomiting continues.  Special Instructions/Symptoms: Your throat may feel dry or sore from the anesthesia or the breathing tube placed in your throat during surgery. If this causes discomfort, gargle with warm salt water. The discomfort should disappear within 24 hours.  If you had a scopolamine patch placed behind your ear for  the management of post- operative nausea and/or vomiting:  1. The medication in the patch is effective for 72 hours, after which it should be removed.  Wrap patch in a tissue and discard in the trash. Wash hands thoroughly with soap and water. 2. You may remove the patch earlier than 72 hours if you experience unpleasant side effects which  may include dry mouth, dizziness or visual disturbances. 3. Avoid touching the patch. Wash your hands with soap and water after contact with the patch.    Next dose of tylenol  if needed is at 6:30pm Next dose of ibuprofen if needed is at 6:30pm

## 2023-10-16 NOTE — Op Note (Signed)
 Pre op diagnosis:  lymphadenopathy, suspected lymphoma  Post op diagnosis:  Same  Procedure performed:  Left inguinal lymph node biopsy  Surgeon:  Lockie Rima, MD  Anesthesia:  General and local  EBL:  Minimal  Specimen:  L inguinal lymph node for lymphoma workup  Findings:  large 3.5 cm lobulated lymph node left groin  Procedure:   Patient was identified in the holding area and taken to the operating room and placed supine on the operating room table.  General anesthesia was induced with LMA.  The patient's left groin was clipped, prepped and draped in sterile fashion.  Time out was performed according to the surgical safety check list.  When all was correct, we continued.    The skin over the enlarged node was infiltrated with local anesthetic.  The skin was incised with a #10 blade.  The subcutaneous tissues were divided with the cautery.  A Wietlaner retractor was used to assist with visualization.      A very large lymph node was immediately apparent.  This was elevated gently with a babcock clamp.  Hemaclips were used to ligate the lymphovascular channels entering the node from all sides.  There were numerous small vessels and lymphatics due to the size of the node.  Cautery was used to assist with the dissection.  Once this was complete, the node was passed off for lymphoma workup. A branch from the saphenous vein was too large for the medium hemaclip due to the size of the lymph node and this was suture ligated.     The cavity was irrigated copiously.  Hemostasis was achieved with the cautery.  Arista was placed into the cavity.  The wound was closed with interrupted 3-0 Vicryl deep dermal sutures and 4-0 Monocryl running subcuticular suture.  The wound was cleaned, dried and dressed with Dermabond.    The patient was awakened from anesthesia and taken to the PACU in stable condition.  Needle, sponge, and instrument counts were correct.

## 2023-10-17 ENCOUNTER — Encounter (HOSPITAL_BASED_OUTPATIENT_CLINIC_OR_DEPARTMENT_OTHER): Payer: Self-pay | Admitting: General Surgery

## 2023-10-17 NOTE — Anesthesia Postprocedure Evaluation (Signed)
 Anesthesia Post Note  Patient: [name] [name]  Procedure(s) Performed: LYMPH NODE BIOPSY (Left: Groin)     Patient location during evaluation: PACU Anesthesia Type: General Level of consciousness: awake and alert Pain management: pain level controlled Vital Signs Assessment: post-procedure vital signs reviewed and stable Respiratory status: spontaneous breathing, nonlabored ventilation and respiratory function stable Cardiovascular status: stable and blood pressure returned to baseline Anesthetic complications: no   No notable events documented.  Last Vitals:  Vitals:   10/16/23 1501 10/16/23 1517  BP: 116/79 116/80  Pulse: 82 81  Resp: 12 16  Temp:  36.9 C  SpO2: 97% 100%    Last Pain:  Vitals:   10/17/23 0902  TempSrc:   PainSc: 2                  Juventino Oppenheim

## 2023-10-18 ENCOUNTER — Encounter: Payer: Self-pay | Admitting: Physician Assistant

## 2023-10-24 LAB — SURGICAL PATHOLOGY

## 2023-10-25 ENCOUNTER — Encounter: Payer: Self-pay | Admitting: Physician Assistant

## 2023-10-25 ENCOUNTER — Telehealth: Payer: Self-pay | Admitting: Physician Assistant

## 2023-10-25 NOTE — Telephone Encounter (Signed)
 I notified [name] [name] by phone regarding excisional biopsy pathology. 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 However given the concern for possible low-grade B-cell lymphoproliferative disorder and NexGen was sent for testing on 10/23/23. Per pathology this should take approximately 1 week to result. All of patient's questions were answered and she expressed understanding of the plan provided.

## 2023-10-31 ENCOUNTER — Encounter: Payer: Self-pay | Admitting: Physician Assistant

## 2023-10-31 ENCOUNTER — Encounter: Payer: Self-pay | Admitting: Medical Oncology

## 2023-10-31 NOTE — Progress Notes (Signed)
 [facility]  Outgoing call to patient to inform her that it will be another week before we have the results from her pathology sent to [facility]. Tissue sent on the 14th, we should have results in another 7 days. Patient expressed understanding.   Esperanza Hedges, RN, BSN, [facility] Oncology Nurse Navigator, [facility] 10/31/2023 2:08 PM

## 2023-11-07 ENCOUNTER — Encounter (HOSPITAL_COMMUNITY): Payer: Self-pay | Admitting: Hematology

## 2023-11-07 ENCOUNTER — Encounter: Payer: Self-pay | Admitting: Physician Assistant

## 2023-11-07 ENCOUNTER — Encounter: Payer: Self-pay | Admitting: Medical Oncology

## 2023-11-07 DIAGNOSIS — R000 Tachycardia, unspecified: Secondary | ICD-10-CM | POA: Diagnosis not present

## 2023-11-07 DIAGNOSIS — R52 Pain, unspecified: Secondary | ICD-10-CM | POA: Diagnosis not present

## 2023-11-07 DIAGNOSIS — R6883 Chills (without fever): Secondary | ICD-10-CM | POA: Diagnosis not present

## 2023-11-07 DIAGNOSIS — R0602 Shortness of breath: Secondary | ICD-10-CM | POA: Diagnosis not present

## 2023-11-07 DIAGNOSIS — R058 Other specified cough: Secondary | ICD-10-CM | POA: Diagnosis not present

## 2023-11-07 DIAGNOSIS — R7989 Other specified abnormal findings of blood chemistry: Secondary | ICD-10-CM | POA: Diagnosis not present

## 2023-11-07 DIAGNOSIS — J0100 Acute maxillary sinusitis, unspecified: Secondary | ICD-10-CM | POA: Diagnosis not present

## 2023-11-07 DIAGNOSIS — Z1152 Encounter for screening for COVID-19: Secondary | ICD-10-CM | POA: Diagnosis not present

## 2023-11-07 DIAGNOSIS — R0981 Nasal congestion: Secondary | ICD-10-CM | POA: Diagnosis not present

## 2023-11-07 NOTE — Progress Notes (Signed)
 [facility]  Outgoing call to patient to inform her that [facility] results are being reviewed by Dr. Rosaline Coma and PA-C or myself will call once reviewed. Patient expressed thanks.   Esperanza Hedges, RN, BSN, [facility] Oncology Nurse Navigator, [facility] 11/07/2023 12:31 PM

## 2023-11-08 ENCOUNTER — Telehealth: Payer: Self-pay | Admitting: Hematology and Oncology

## 2023-11-08 NOTE — Telephone Encounter (Signed)
 Called Mrs. Verstraete to discuss the results of her biopsy.  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  However at this time there is no definitive diagnosis of lymphoma.  Given the complicated clinical picture I would recommend we follow-up with her to monitor these lymph nodes.  Her last PET CT scan was in April 2025, I would recommend repeat CT chest abdomen pelvis in October 2025 with a clinic visit shortly thereafter.  Pending the results of that visit we will determine next dose moving forward, however I would recommend follow-up due to my concern that this may convert into a lymphoma in the future.  If we do see progression of lymph nodes or other concerning abnormalities we can consider rebiopsy.  Patient voiced understanding of our findings and plan.  Rogerio Clay, MD Department of Hematology/Oncology [facility] [place] Phone: [phone] Pager: [phone] Email: [email or web address]

## 2023-11-14 DIAGNOSIS — R1319 Other dysphagia: Secondary | ICD-10-CM | POA: Diagnosis not present

## 2023-11-14 DIAGNOSIS — K219 Gastro-esophageal reflux disease without esophagitis: Secondary | ICD-10-CM | POA: Diagnosis not present

## 2023-11-14 DIAGNOSIS — R000 Tachycardia, unspecified: Secondary | ICD-10-CM | POA: Diagnosis not present

## 2023-11-14 DIAGNOSIS — R051 Acute cough: Secondary | ICD-10-CM | POA: Diagnosis not present

## 2023-11-19 ENCOUNTER — Encounter: Payer: Self-pay | Admitting: Cardiology

## 2023-11-19 ENCOUNTER — Ambulatory Visit: Attending: Cardiology | Admitting: Cardiology

## 2023-11-19 VITALS — BP 102/68 | HR 102 | Resp 16 | Ht 60.0 in | Wt 113.0 lb

## 2023-11-19 DIAGNOSIS — I700 Atherosclerosis of aorta: Secondary | ICD-10-CM | POA: Diagnosis not present

## 2023-11-19 DIAGNOSIS — Q278 Other specified congenital malformations of peripheral vascular system: Secondary | ICD-10-CM | POA: Diagnosis not present

## 2023-11-19 DIAGNOSIS — R000 Tachycardia, unspecified: Secondary | ICD-10-CM

## 2023-11-19 DIAGNOSIS — R931 Abnormal findings on diagnostic imaging of heart and coronary circulation: Secondary | ICD-10-CM

## 2023-11-19 DIAGNOSIS — E7800 Pure hypercholesterolemia, unspecified: Secondary | ICD-10-CM | POA: Diagnosis not present

## 2023-11-19 MED ORDER — ATORVASTATIN CALCIUM 20 MG PO TABS: 20.0000 mg | ORAL_TABLET | Freq: Every day | ORAL | 0 refills | Status: DC

## 2023-11-19 NOTE — Patient Instructions (Signed)

## 2023-11-19 NOTE — Progress Notes (Signed)
 Cardiology Office Note:  .   Date:  11/19/2023  ID:  [name] [name], DOB 01-Aug-1965, MRN [id number] PCP: Suzzanne Estrin, MD  [facility] Providers Cardiologist:  Knox Perl, MD   History of Present Illness: .   [name] [name] is a 58 y.o. Caucasian female patient presenting with low-grade fever, sore throat and shortness of breath and seen by her PCP on 11/07/2023 treated conservatively, also found to have heart rate around 110 to 140 bpm is referred for cardiac evaluation.  Chest x-ray was normal and CBC was normal. She was also recently diagnosed with lymphoma after left inguinal lymph node biopsy on 09/19/2023 fortunately her biopsy cytochemistry revealed hyperplasia only.  Discussed the use of AI scribe software for clinical note transcription with the patient, who gave verbal consent to proceed.  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  She experiences sinus tachycardia with heart rates ranging from 110 to 150 bpm, confirmed by a home cardiac monitoring device. Her heart rate has been elevated since 2022 and has increased recently. These symptoms occur both at home and during medical visits.  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 Swallowing issues have been present for years, often leading to choking when eating.  Her family history includes high cholesterol on both sides, with her LDL recorded at 174 mg/dL in January 4098. She drinks one to two cups of coffee daily, opting for half-caffeinated options, and does not smoke.  Labs   Lab Results  Component Value Date   NA 138 09/09/2023   K 4.1 09/09/2023   CO2 33 (H) 09/09/2023   GLUCOSE 87 09/09/2023   BUN 12 09/09/2023   CREATININE 0.74 09/09/2023   CALCIUM 9.5 09/09/2023   GFR 91.76 01/23/2023    GFRNONAA >60 09/09/2023      Latest Ref Rng & Units 09/09/2023    2:13 PM 01/23/2023   11:28 AM 10/12/2022   10:52 AM  BMP  Glucose 70 - 99 mg/dL 87  88  95   BUN 6 - 20 mg/dL 12  14  13    Creatinine 0.44 - 1.00 mg/dL 1.19  1.47  8.29   Sodium 135 - 145 mmol/L 138  136  138   Potassium 3.5 - 5.1 mmol/L 4.1  4.2  5.2 No hemolysis seen   Chloride 98 - 111 mmol/L 100  99  100   CO2 22 - 32 mmol/L 33  31  32   Calcium 8.9 - 10.3 mg/dL 9.5  9.5  9.8    56.2       Latest Ref Rng & Units 09/09/2023    2:13 PM 01/23/2023   11:28 AM 10/12/2022   10:52 AM  CBC  WBC 4.0 - 10.5 K/uL 7.4  6.1  6.0   Hemoglobin 12.0 - 15.0 g/dL 13.0  86.5  78.4   Hematocrit 36.0 - 46.0 % 42.5  42.3  42.9   Platelets 150 - 400 K/uL 238  252.0  264.0    No results found for: "HGBA1C"  Lab Results  Component Value Date   TSH 0.68 10/12/2022    External Labs:  Labs 06/21/2023:  Total cholesterol 264, triglycerides 115, HDL 67, LDL 174.  ROS  Review of Systems  Cardiovascular:  Negative for chest pain, dyspnea on exertion and leg  swelling.    Physical Exam:   VS:  BP (!) 102/98 (BP Location: Left Arm, Patient Position: Sitting, Cuff Size: Normal)   Pulse (!) 102   Resp 16   Ht 5' (1.524 m)   Wt 113 lb (51.3 kg)   LMP 01/05/2011   SpO2 98%   BMI 22.07 kg/m    Wt Readings from Last 3 Encounters:  11/19/23 113 lb (51.3 kg)  10/16/23 115 lb 8.3 oz (52.4 kg)  09/09/23 115 lb 6.4 oz (52.3 kg)    Physical Exam Neck:     Vascular: No carotid bruit or JVD.  Cardiovascular:     Rate and Rhythm: Normal rate and regular rhythm.     Pulses: Intact distal pulses.     Heart sounds: Normal heart sounds. No murmur heard.    No gallop.  Pulmonary:     Effort: Pulmonary effort is normal.     Breath sounds: Normal breath sounds.  Abdominal:     General: Bowel sounds are normal.     Palpations: Abdomen is soft.  Musculoskeletal:     Right lower leg: No edema.     Left lower leg: No edema.    Studies  Reviewed: Aaron Aas    Coronary calcium score 07/04/2023: Total Agatston score 2, [place] database percentile 72. Scattered aortic atherosclerosis.  PET CT 09/25/23: There are no hypermetabolic mediastinal, hilar or axillary lymph nodes. No hypermetabolic pulmonary activity or suspicious nodularity.   Incidental CT findings: Mild aortic atherosclerosis. 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  EKG:    EKG Interpretation Date/Time:  Tuesday November 19 2023 14:36:17 EDT Ventricular Rate:  100 PR Interval:  130 QRS Duration:  80 QT Interval:  334 QTC Calculation: 430 R Axis:   38  Text Interpretation: EKG 11/19/2023: Normal sinus rhythm at rate of 1 bpm, otherwise normal EKG. Confirmed by Cecilee Rosner, Jagadeesh ([id number]) on 11/19/2023 2:43:50 PM    Medications and allergies    Allergies  Allergen Reactions   Metoclopramide Hcl    Penicillins     GI upset   Reglan [Metoclopramide] Anxiety     Current Outpatient Medications:    ALPRAZolam (XANAX) 0.5 MG tablet, Take 0.5 mg by mouth at bedtime as needed for anxiety., Disp: , Rfl:    atorvastatin (LIPITOR) 20 MG tablet, Take 1 tablet (20 mg total) by mouth daily., Disp: 90 tablet, Rfl: 0   botulinum toxin Type A  (BOTOX ) 100 units SOLR injection, Provider to inject 155 units into the muscles of the head and neck every 3 months. Discard remainder., Disp: 2 each, Rfl: 1   Cetirizine HCl (ZYRTEC PO), Take 10 mg by mouth daily as needed., Disp: , Rfl:    Cholecalciferol (VITAMIN D3 PO), Take 5,000 Int'l Units/L by mouth every other day. Patient state, Disp: , Rfl:    Dextroamphetamine Sulfate 30 MG TABS, Take by mouth., Disp: , Rfl:    fluticasone (FLONASE) 50 MCG/ACT nasal spray, Place 2 sprays into both nostrils as needed., Disp: , Rfl:    NONFORMULARY OR COMPOUNDED ITEM, Dilitizine Ointment used as needed, Disp: , Rfl:    PARoxetine (PAXIL) 30 MG tablet, Take 20 mg by mouth daily., Disp: , Rfl:    progesterone (PROMETRIUM)  200 MG capsule, Take 200 mg by mouth daily., Disp: , Rfl:    rizatriptan  (MAXALT -MLT) 10 MG disintegrating tablet, Take 1 tablet (10 mg total) by mouth as needed for migraine. May repeat in 2 hours if needed, Disp: 9 tablet, Rfl: 11  TRAZODONE HCL PO, Take 100 mg by mouth at bedtime., Disp: , Rfl:    UNABLE TO FIND, Biotepellet  inserted every 3 months for hormone replacement, Disp: , Rfl:    Meds ordered this encounter  Medications   atorvastatin (LIPITOR) 20 MG tablet    Sig: Take 1 tablet (20 mg total) by mouth daily.    Dispense:  90 tablet    Refill:  0    Refills to Dr. Richard Tisovec     Medications Discontinued During This Encounter  Medication Reason   Amphet-Dextroamphet 3-Bead ER (MYDAYIS) 50 MG CP24 Change in therapy   ondansetron  (ZOFRAN -ODT) 4 MG disintegrating tablet Completed Course   HYDROcodone -acetaminophen  (NORCO) 5-325 MG tablet Completed Course   oxyCODONE  (OXY IR/ROXICODONE ) 5 MG immediate release tablet Completed Course     ASSESSMENT AND PLAN: .      ICD-10-CM   1. Sinus tachycardia  R00.0 EKG 12-Lead    2. Elevated coronary artery calcium score 07/04/2023: Total Agatston score 2, [place] database percentile 72.  R93.1 atorvastatin (LIPITOR) 20 MG tablet    3. 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 Pure hypercholesterolemia  E78.00 atorvastatin (LIPITOR) 20 MG tablet      Assessment and Plan Assessment & Plan Elevated Coronary Calcium Score and Aortic Atherosclerosis Coronary calcium score in the 72nd percentile and aortic atherosclerosis indicate increased cardiac disease risk. LDL cholesterol elevated at 174 mg/dL. Discussed importance of managing cholesterol levels to reduce cardiac risk. Diet changes alone expected to reduce LDL by only 10%. She expresses concern about potential joint pain from statins but agrees to try the medication. Lipitor 20 mg once daily prescribed  with a 90-day supply sent to [facility]. Follow-up with primary care physician, Dr. Richard Tisovec, in two months for lipid panel re-evaluation. - Prescribe Lipitor 20 mg once daily with a 90-day supply to [facility] - Advise follow-up with Dr. Richard Tisovec in two months for lipid panel re-evaluation  Sinus Tachycardia Reports elevated heart rates ranging from 110 to 150 bpm, with normal sinus rhythm confirmed by a personal cardiac monitoring device.  She is a disabled Garment/textile technologist.  Symptoms of elevated heart rate are present since 2022 and asymptomatic. Sinus tachycardia in an asymptomatic individual does not require therapy. Cardiac exercises recommended to monitor heart rate. Empirical trial of B1 and B6 supplements suggested as they are water-soluble and excess is excreted in urine. 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 This condition can lead to chronic cough and possibly pulmonary fibrosis due to aspiration. Follow-up with GI specialist and possibly a vascular surgeon for further evaluation and management is advised. - Refer to GI specialist for further evaluation - Consider referral to vascular surgeon - Send a note to Dr. Venson Ginger for further recommendations  Otherwise from cardiac standpoint she is stable, she will continue to follow-up with her PCP for management of hyperlipidemia and I am happy to see him back if cardiac issues were to arise.  Signed,  Knox Perl, MD, [facility] 11/19/2023, 3:05 PM [facility] [address] [place], [place] [address] Phone: [phone]. Fax:  [phone]

## 2023-11-28 ENCOUNTER — Other Ambulatory Visit: Payer: Self-pay | Admitting: Gastroenterology

## 2023-11-28 DIAGNOSIS — R1310 Dysphagia, unspecified: Secondary | ICD-10-CM

## 2023-11-28 DIAGNOSIS — R1311 Dysphagia, oral phase: Secondary | ICD-10-CM | POA: Diagnosis not present

## 2023-11-28 DIAGNOSIS — R109 Unspecified abdominal pain: Secondary | ICD-10-CM

## 2023-11-28 DIAGNOSIS — K219 Gastro-esophageal reflux disease without esophagitis: Secondary | ICD-10-CM | POA: Diagnosis not present

## 2023-12-05 ENCOUNTER — Ambulatory Visit
Admission: RE | Admit: 2023-12-05 | Discharge: 2023-12-05 | Disposition: A | Discharge status: Home / Self Care | Source: Ambulatory Visit | Attending: Gastroenterology | Admitting: Gastroenterology

## 2023-12-05 DIAGNOSIS — R1310 Dysphagia, unspecified: Secondary | ICD-10-CM | POA: Diagnosis not present

## 2023-12-05 DIAGNOSIS — R109 Unspecified abdominal pain: Secondary | ICD-10-CM

## 2023-12-30 DIAGNOSIS — R590 Localized enlarged lymph nodes: Secondary | ICD-10-CM | POA: Diagnosis not present

## 2023-12-31 DIAGNOSIS — F902 Attention-deficit hyperactivity disorder, combined type: Secondary | ICD-10-CM | POA: Diagnosis not present

## 2024-01-06 ENCOUNTER — Ambulatory Visit: Admitting: Adult Health

## 2024-01-06 ENCOUNTER — Encounter: Payer: Self-pay | Admitting: Adult Health

## 2024-01-06 VITALS — BP 95/67 | HR 103

## 2024-01-06 DIAGNOSIS — G43711 Chronic migraine without aura, intractable, with status migrainosus: Secondary | ICD-10-CM | POA: Diagnosis not present

## 2024-01-06 MED ORDER — ONABOTULINUMTOXINA 200 UNITS IJ SOLR
155.0000 [IU] | Freq: Once | INTRAMUSCULAR | Status: AC
  Administered 2024-01-06: 161 [IU] via INTRAMUSCULAR

## 2024-01-06 MED ORDER — ONABOTULINUMTOXINA 200 UNITS IJ SOLR: 155.0000 [IU] | Freq: Once | INTRAMUSCULAR | Status: DC

## 2024-01-06 NOTE — Progress Notes (Signed)
 01/06/24: Migraines are under good control. Neck pain is better with botox  regimin that has cut back on her migraines. Have not had any migraines. 3 moderate headaches since last seen. No aura and no stroke like symptoms with her migraines.   10/09/23: headaches have been good. Reports neck pain was better with botox  but took about 2 weeks to work. Has rizatriptan  but doesn't use. Ice and OTC meds work best for her.  Has a swollen lymph in the groin- she goes next Wednesday 5/8 to have surgery removed. Bisopy showed marginal zone lymphoma.   06/26/23: Botox  working well. No migraines. May get a sinus headaches. Continues to have neck-shoulder pain. Pain worse by the end of the day. Had MRI cervical spine in 2020.  03/20/23: Botox  working well. Shoulders must better this time  12/20/22: Botox  working well. Continues to have neck pain. Seeing sports medicine and doing dry needling. Hurts more on the right than left.   09/18/2022: still doing well > 70% improvement in migraine freq and severity and headache as well. Tightness in neck, tried muscle relaxers, conservative measures, dryneedling: send to dr claudene and see if he can help.     06/19/2022: botox  working well. Reports neck stiffnes and should tightness left > right   BOTOX  PROCEDURE NOTE FOR MIGRAINE HEADACHE    Contraindications and precautions discussed with patient(above). Aseptic procedure was observed and patient tolerated procedure. Procedure performed by Duwaine Russell, NP  The condition has existed for more than 6 months, and pt does not have a diagnosis of ALS, Myasthenia Gravis or Lambert-Eaton Syndrome.  Risks and benefits of injections discussed and pt agrees to proceed with the procedure.  Written consent obtained  These injections are medically necessary. These injections do not cause sedations or hallucinations which the oral therapies may cause.  Indication/Diagnosis: chronic migraine BOTOX (G9414) injection was performed  according to protocol by [employer]. 200 units of BOTOX  was dissolved into 4 cc NS.   NDC: [address]-98  Type of toxin: Botox    Botox - 200 units x 1 vial Lot: [id number] Expiration: 03/2026 NDC: 9976-6078-97   Bacteriostatic 0.9% Sodium Chloride - 4 mL  Lot: [id number] Expiration: 04/10/2025 NDC: 9590-8033-97   Dx: H56.288           Description of procedure:  The patient was placed in a sitting position. The standard protocol was used for Botox  as follows, with 5 units of Botox  injected at each site:   -Procerus muscle, midline injection  -Corrugator muscle, bilateral injection  -Frontalis muscle, bilateral injection, with 2 sites each side, medial injection was performed in the upper one third of the frontalis muscle, in the region vertical from the medial inferior edge of the superior orbital rim. The lateral injection was again in the upper one third of the forehead vertically above the lateral limbus of the cornea, 1.5 cm lateral to the medial injection site.  -Temporalis muscle injection, 4 sites, bilaterally. The first injection was 3 cm above the tragus of the ear, second injection site was 1.5 cm to 3 cm up from the first injection site in line with the tragus of the ear. The third injection site was 1.5-3 cm forward between the first 2 injection sites. The fourth injection site was 1.5 cm posterior to the second injection site.  -Occipitalis muscle injection, 3 sites, bilaterally. The first injection was done one half way between the occipital protuberance and the tip of the mastoid process behind the ear. The second injection site was done lateral  and superior to the first, 1 fingerbreadth from the first injection. The third injection site was 1 fingerbreadth superiorly and medially from the first injection site.  -Cervical paraspinal muscle injection, 2 sites, bilateral knee first injection site was 1 cm from the midline of the cervical spine, 3 cm inferior to the lower border  of the occipital protuberance. The second injection site was 1.5 cm superiorly and laterally to the first injection site.  -Trapezius muscle injection was performed at 3 sites, bilaterally. The first injection site was in the upper trapezius muscle halfway between the inflection point of the neck, and the acromion. The second injection site was one half way between the acromion and the first injection site. The third injection was done between the first injection site and the inflection point of the neck. One injection slighty below the third injection (3 units) bilaterally.   Will return for repeat injection in 3 months.   A 200 units of Botox  was used, 161 units were injected, the rest of the Botox  was wasted. The patient tolerated the procedure well, there were no complications of the above procedure.  Duwaine Russell, MSN, NP-C 01/06/2024, 9:03 AM [facility] [address], [address] [place], [place] [address] [phone]

## 2024-01-06 NOTE — Progress Notes (Signed)
 Botox - 200 units x 1 vial Lot: [id number] Expiration: 03/2026 NDC: 9976-6078-97   Bacteriostatic 0.9% Sodium Chloride - 4 mL  Lot: [id number] Expiration: 04/10/2025 NDC: 9590-8033-97   Dx: H56.288 B/B Witnessed by Rojean Hurst, CMA

## 2024-01-08 ENCOUNTER — Telehealth: Payer: Self-pay | Admitting: Neurology

## 2024-01-08 NOTE — Telephone Encounter (Signed)
 Received sleep referral for patient from Olam Blackwater, NP at [facility] for sleep issues, non-restful sleep, wakes up tired, normal lab work. Placed in sleep referrals box

## 2024-02-18 ENCOUNTER — Ambulatory Visit: Admitting: Neurology

## 2024-02-18 ENCOUNTER — Encounter: Payer: Self-pay | Admitting: Neurology

## 2024-02-18 VITALS — BP 96/70 | HR 110 | Ht 61.0 in | Wt 106.2 lb

## 2024-02-18 DIAGNOSIS — G43101 Migraine with aura, not intractable, with status migrainosus: Secondary | ICD-10-CM | POA: Diagnosis not present

## 2024-02-18 DIAGNOSIS — F5104 Psychophysiologic insomnia: Secondary | ICD-10-CM

## 2024-02-18 DIAGNOSIS — R0981 Nasal congestion: Secondary | ICD-10-CM | POA: Diagnosis not present

## 2024-02-18 DIAGNOSIS — G43711 Chronic migraine without aura, intractable, with status migrainosus: Secondary | ICD-10-CM | POA: Diagnosis not present

## 2024-02-18 DIAGNOSIS — D869 Sarcoidosis, unspecified: Secondary | ICD-10-CM

## 2024-02-18 NOTE — Progress Notes (Unsigned)
 @[facility]   Provider:  Dedra Gores, MD  Primary Care Physician:  [name], [name] W, MD [address] [place] [address]   Referring Provider: Darden Planas, Np [address] [place],  [place] [address]        Chief Concern for this Consultation:   Patient presents with          HPI: I have the pleasure of meeting with [name] [name], on  02-18-2024, who is a 58 y.o. female patient  of [name] [name] and seen upon a referral by [name] [name] Sleep Medicine Consultation.    The patient's referral information asked for a  baseline sleep study in the setting of chronic insomnia, and non refreshing sleep- getting through the day on ADHD stimulant medication.    Chief concern according to patient: I have little amnestic  spells, and some weeks I am well able to focus and other times I can't.  I have migraine and CRPS. She  has a past medical history of Anxiety about health,  chronic insomnia, Brain fog, Chronic regional pain, Hemorrhoids, History of neck problems, Leg cramping, left  (2020), Migraine headache, hemorrhoids, and Sarcoidosis.   Sleep relevant medical/ surgical and symptom history: The patient reports onset of fatigue symptoms over a time period of 15 years,  and its worsening since January there has been more anxiety , more insomnia, related to finding lump in left groin.  ENT surgery , age 15 Tonsillectomy,   Sleep walking in childhood and on Ambien, tingling in the right leg ,   TMJ, GERD,  Mood disorders, Anxiety more than Depression.  ADD/ ADHD.    This patient had a previous sleep study studies in the year 2012 at the [facility],  with a resulting diagnosis of no [name]- . This patient has used the following therapies: medication for insomnia; trazodone is the best.    Family medical history: There are no biological family members affected by [name] [name] ( /), or Insomnia (/) ,  mother has developed  excessive daytime sleepiness at age 44 plus.      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 , she lives in a private home, in a household with  spouse, and daughter is a [employer] Doctor, hospital-  emotional support dog/ her daughter's  pet.  The patient used to work in shifts (Chief Technology Officer,) . Nicotine use: /.  ETOH use: /,  Caffeine intake in form of: Coffee (2 in AM ), Soft drinks (/), Tea ( /) or Energy drinks ( including those containing  taurine ). Caffeine is last consumed at 10 AM .  Exercises not  regularly .  Hobbies: painting /  no Volunteering/, ( member of a club, church or other community memberships and engagements).      Sleep habits and routines are as follows: The patient's dinner time is around 7  PM.  Evening time is spent by Tv or reading. The patient goes to bed at, or close to, 11 PM. The bedroom is shared with spouse and is described as cool, quiet, and dark.  She sleeps with a heating blanket.  The patient reports that it takes medications to go to sleep, and it takes 30 minutes to fall asleep, then continues to sleep for 7-8 hours, uninterrupted or by the need to void (Nocturia).   Up with alarm by 7 AM.  The preferred sleep position is lateral, with support of one  chiropractic  pillow, (non- adjustable bed/ recliner ).  The total estimated sleep time is circa 7 hours.  Dreams are reportedly infrequent and can be vivid.  Dream enactment has not been reported.   7 AM is the usual week- day rise time. The patient wakes up with an alarm se at 7AM.  She reports not/ feeling refreshed and restored in the morning, waking with symptoms such as a very dry mouth,  dull morning headaches, stiffness or pain, and fatigue.   No sleep paralysis has been experienced.  Naps in daytime are taken infrequently (there is a desire to nap and opportunity),  dependent on taking amphetamine  30 mg in AM 15 mg at noon.   She will rarely take a power nap - These do not interfere with nocturnal sleep.    Review of  Systems: Out of a complete 14 system review, the patient complains of only the following symptoms, and all other reviewed systems are negative.:  Hypersomnia some days    Insomnia yes  Depression/ anxiety:yes   Pain: yes .  Headaches  yes    Snoring, infrequently  Sleep fragmentation, Nocturia   How likely are you to doze in the following situations: 0 = not likely, 1 = slight chance, 2 = moderate chance, 3 = high chance Sitting and Reading? Watching Television? Sitting inactive in a public place (theater or meeting)? As a passenger in a car for an hour without a break? Lying down in the afternoon when circumstances permit? Sitting and talking to someone? Sitting quietly after lunch without alcohol ? In a car, while stopped for a few minutes in traffic?   Total ESS =18 when off meds, and 3 when on meds.  / 24 points.    FSS endorsed at 53/ 63 points.  GDS: na  Social History   Socioeconomic History   Marital status: Married    Spouse name: Not on file   Number of children: None daughter  2004,    Years of education: N18   Highest education level: Manufacturing engineer (e.g., MA, MS, MEng, MEd, MSW, MBA)  Occupational History   Not on file  Tobacco Use   Smoking status: Never   Smokeless tobacco: Never  Vaping Use   Vaping status: Never Used  Substance and Sexual Activity   Alcohol  use: Yes    Alcohol /week: 1.0 standard drink of alcohol     Types: 1 Standard drinks or equivalent per week    Comment: occasionally   Drug use: No   Sexual activity: Yes    Partners: Male  Other Topics Concern   Not on file  Social History Narrative   Lives at home with husband and daughter   Right handed   Caffeine: 1 cup/day   Social Drivers of Corporate investment banker Strain: Not on file  Food Insecurity: Not on file  Transportation Needs: Not on file  Physical Activity: Not on file  Stress: Not on file  Social Connections: Not on file    Family History  Problem Relation Age  of Onset   Diabetes Mother        TYPE 2   Hypertension Mother    Cancer Mother        UTERINE CANCER   Heart disease Father        HEART ATTACK   Migraines Father    Stroke Father     Past Medical History:  Diagnosis Date   Anxiety    Brain fog    Chronic  pain    COMPLEX REGIONAL PAIN SYNDROME   Hemorrhoids    History of neck problems    dry needling being done & PT   Leg cramping 2020   right, says sometimes she has a hard time putting pressure on that leg    Migraine headache    Sarcoidosis     Past Surgical History:  Procedure Laterality Date   CHOLECYSTECTOMY     DILATION AND CURETTAGE OF UTERUS     ESOPHAGEAL MANOMETRY N/A 05/05/2018   Procedure: ESOPHAGEAL MANOMETRY (EM);  Surgeon: Rosalie Kitchens, MD;  Location: [facility];  Service: Endoscopy;  Laterality: N/A;   LYMPH NODE BIOPSY Left 10/16/2023   Procedure: LYMPH NODE BIOPSY;  Surgeon: Aron Shoulders, MD;  Location: [facility];  Service: General;  Laterality: Left;  LEFT INGUINAL LYMPH NODE EXCISIONAL BIOPSY   PERINEOPLASTY     had scarring after her delivery   SHOULDER ARTHROSCOPY Left 06/11/2006   THORACOSCOPY Left    TONSILLECTOMY AND ADENOIDECTOMY     AT AGE 89   WISDOM TOOTH EXTRACTION       Current Outpatient Medications on File Prior to Visit  Medication Sig Dispense Refill   ALPRAZolam (XANAX) 0.5 MG tablet Take 0.5 mg by mouth at bedtime as needed for anxiety.     botulinum toxin Type A  (BOTOX ) 100 units SOLR injection Provider to inject 155 units into the muscles of the head and neck every 3 months. Discard remainder. 2 each 1   Cetirizine HCl (ZYRTEC PO) Take 10 mg by mouth daily as needed.     Cholecalciferol (VITAMIN D3 PO) Take 5,000 Int'l Units/L by mouth every other day. Patient state     Dextroamphetamine Sulfate 30 MG TABS Take by mouth. (Patient taking differently: Take 45 mg by mouth daily.)     fluticasone (FLONASE) 50 MCG/ACT nasal spray Place 2 sprays into both nostrils as  needed.     NONFORMULARY OR COMPOUNDED ITEM Dilitizine Ointment used as needed     PARoxetine (PAXIL) 30 MG tablet Take 20 mg by mouth daily.     progesterone (PROMETRIUM) 200 MG capsule Take 200 mg by mouth daily.     rizatriptan  (MAXALT -MLT) 10 MG disintegrating tablet Take 1 tablet (10 mg total) by mouth as needed for migraine. May repeat in 2 hours if needed 9 tablet 11   TRAZODONE HCL PO Take 100 mg by mouth at bedtime.     UNABLE TO FIND Biotepellet  inserted every 3 months for hormone replacement     No current facility-administered medications on file prior to visit.    Allergies  Allergen Reactions   Metoclopramide Hcl    Penicillins     GI upset   Reglan [Metoclopramide] Anxiety    Vitals:   02/18/24 0956  BP: 96/70  Pulse: (!) 110     Physical exam:   General: The patient was alert and appears not in acute distress.  Mood and affect are appropriate .  The patient's interactions are: Cooperative, makes eye contact, follows the instructions and answers questions coherently.  The patient is groomed and appropriately groomed and dressed. Head: Normocephalic, atraumatic.  Neck is supple. Mallampati: 1.  The neck circumference measured 13 inches. Nasal airflow was patent ,   Overbite / Retrognathia was noted.  Dental status: biological  Cardiovascular:  Regular rate and cardiac rhythm by palpable pulse. Respiratory: no audible wheezing, no tachypnoea.   Skin:  Without evidence of ankle edema. No discoloration.  Trunk:  BMI is 20 The patient's posture was erect.   Neurologic exam : The patient was awake and alert, oriented to place and time.   Attention span & concentration ability appeared normal.  Speech was fluent, without dysarthria, dysphonia or aphasia, and of normal volume.     Cranial nerves:  There was no loss of smell or taste reported  Pupils are round, equal in size and briskly reactive to light.  Funduscopic exam was deferred.  Extraocular  movements in vertical and horizontal planes were intact and without nystagmus. (No Diplopia reported). Visual fields by finger perimetry are intact. Hearing was intact to soft voice.    Facial sensation intact to fine touch.  Facial motor strength: Symmetric movement and tongue and uvula move midline.  Neck ROM: rotation, tilt and flexion extension were pain limits ROM,  the discomfort- controlled under botox .    Age and shoulder shrug was symmetrical.    Motor exam:  Symmetric bulk, strength and ROM.   Normal tone without cog- wheeling, and symmetric grip strength.   Sensory:  Fine touch and vibration were tested by tuning fork and intact.  Proprioception tested in the upper extremities was normal.   Coordination: The patient reported no problems with button closure and no changes to penmanship.   The Finger-to-nose maneuver was intact without evidence of ataxia, dysmetria or tremor.   Gait and station: Patient could rise unassisted from a seated position, without bracing, and walked without assistive device.  Stance was of normal/ wide width. The patient turned with 3 steps ( observation on entry) . Toe and heel walk were deferred. No limp was noted.  Arm swing was preserved.   Deep tendon reflexes: Upper extremities did show symmetric DTRs. Lower extremity DTRs were symmetric and brisk/.   Babinski response was deferred .   I would like to thank [name], Charlie ORN, MD, [name] Onetha Epp, and [name] Planas, Np [address] [place],  [place] [address] for allowing me to meet with this patient .     Non - refreshing, non restorative sleep  in the setting of  chronic Insomnia, medication is needed for sleep initiation- here to rule out organic causes:   Risk factors for OSA were not present, including : Body mass index is 20.07 kg/m., small neck size , normal  upper airway anatomy. PLMs, RLS related to regional pain syndrome.  Vivid dreams when not taking benzos Excessive  fatigue and sleepiness , EDS when not taking amphetamine  No sleep paralysis, no hypnagogic hallucinations and no sleep paralysis.   My Plan is to proceed with:  PSG, attended sleep study to  screen for all organic causes that could affect sleep quality and headache disorder.   I plan to follow up personally  or through our NP within 4-5 months if the PSG is showing any organic disorder.   She will take Trazodone to the sleep.   A total time of  45  minutes consistent of a part of face to face encounter , exam and interview,  and additional preparation time for chart review was spent .  At today's visit, we discussed treatment options, associated risk and benefits, and engage in counseling as needed including, but not limited to:  Sleep hygiene, Quality Sleep Habits, and Safety concerns for patients with daytime sleepiness who are warned to not operate machinery/ motor vehicles when drowsy.  Additionally, the following were reviewed: Past medical records, past medical and surgical history, family and social  background, as well as relevant laboratory results, imaging findings, and medical notes, where applicable.  This note was generated by myself in part by using dictation software, and as a result, it may contain unintentional typos and errors.  Nevertheless, effort was made to accurately convey the pertinent aspects of the patient's visit.   [name] Gores, MD   [facility] Sleep Board certified in Sleep Medicine by The [employer] of Sleep Medicine and Diplomate of the [employer] of Sleep Medicine (AASM) . Board certified In Neurology, Diplomat of the ABPN,  Fellow of the [employer] of Neurology.

## 2024-02-18 NOTE — Patient Instructions (Signed)
 Sleep Study   The sleep study consists of a recording of your brain waves (EEG). Breathing, heart rate and rhythm (ECG), oxygen level, eye movement, and leg movement.  The technician will glue or or paste several electrodes to your scalp, face, chest and legs.  You will have belts around your chest and abdomen to record breathing and a finger clasp to check blood oxygen levels.  A tube at your mouth and nose will detect airflow.  There are no needle sticks or painful procedures of any sort.  You will have your own room, and we will make every effort to attend to your comfort and privacy.  Please prepare for your study by the following steps:  Please avoid coffee, tea, soda, chocolate and other caffeine foods or beverages after 12:00 noon on the day of your sleep study.  You must arrive with clean (no oils), conditioners or make up, and please make sure that you wash your hair to ensure that your hair and scalp are clean, dry and free of any hair extensions on the day of your study.  This will help to get a good reading of study. Please try not to nap on the day of your study. Please bring a list of all your medications. Bring any medications that you might need during the time you are within the laboratory, including insulin , sleeping pills, pain medication and anxiety medications. Bring snacks, water or juice Please bring clothes to sleep in and your normal overnight bag. Please leave valuable at home, as we will not be responsible for any lost items.  If you have any further questions, please feel free to call our office. Thank you  Please call our office 48 hours ( 2 business days )  in advance to cancel or reschedule to avoid a $ 250.00 early cancel or no show fee.     Quality Sleep Information, Adult Quality sleep is important for your mental and physical health. It also improves your quality of life. Quality sleep means you: Are asleep for most of the time you are in bed. Fall asleep  within 30 minutes. Wake up no more than once a night. Are awake for no longer than 20 minutes if you do wake up during the night. Most adults need 7-8 hours of quality sleep each night. How can poor sleep affect me? If you do not get enough quality sleep, you may have: Mood swings. Daytime sleepiness. Decreased alertness, reaction time, and concentration. Sleep disorders, such as insomnia and sleep apnea. Difficulty with: Solving problems. Coping with stress. Paying attention. These issues may affect your performance and productivity at work, school, and home. Lack of sleep may also put you at higher risk for accidents, suicide, and risky behaviors. If you do not get quality sleep, you may also be at higher risk for several health problems, including: Infections. Type 2 diabetes. Heart disease. High blood pressure. Obesity. Worsening of long-term conditions, like arthritis, kidney disease, depression, Parkinson's disease, and epilepsy. What actions can I take to get more quality sleep? Sleep schedule and routine Stick to a sleep schedule. Go to sleep and wake up at about the same time each day. Do not try to sleep less on weekdays and make up for lost sleep on weekends. This does not work. Limit naps during the day to 30 minutes or less. Do not take naps in the late afternoon. Make time to relax before bed. Reading, listening to music, or taking a hot bath promotes quality  sleep. Make your bedroom a place that promotes quality sleep. Keep your bedroom dark, quiet, and at a comfortable room temperature. Make sure your bed is comfortable. Avoid using electronic devices that give off bright blue light for 30 minutes before bedtime. Your brain perceives bright blue light as sunlight. This includes television, phones, and computers. If you are lying awake in bed for longer than 20 minutes, get up and do a relaxing activity until you feel sleepy. Lifestyle     Try to get at least 30  minutes of exercise on most days. Do not exercise 2-3 hours before going to bed. Do not use any products that contain nicotine or tobacco. These products include cigarettes, chewing tobacco, and vaping devices, such as e-cigarettes. If you need help quitting, ask your health care provider. Do not drink caffeinated beverages for at least 8 hours before going to bed. Coffee, tea, and some sodas contain caffeine. Do not drink alcohol  or eat large meals close to bedtime. Try to get at least 30 minutes of sunlight every day. Morning sunlight is best. Medical concerns Work with your health care provider to treat medical conditions that may affect sleeping, such as: Nasal obstruction. Snoring. Sleep apnea and other sleep disorders. Talk to your health care provider if you think any of your prescription medicines may cause you to have difficulty falling or staying asleep. If you have sleep problems, talk with a sleep consultant. If you think you have a sleep disorder, talk with your health care provider about getting evaluated by a specialist. Where to find more information Sleep Foundation: sleepfoundation.org American Academy of Sleep Medicine: aasm.org Centers for Disease Control and Prevention (CDC): TonerPromos.no Contact a health care provider if: You have trouble getting to sleep or staying asleep. You often wake up very early in the morning and cannot get back to sleep. You have daytime sleepiness. You have daytime sleep attacks of suddenly falling asleep and sudden muscle weakness (narcolepsy). You have a tingling sensation in your legs with a strong urge to move your legs (restless legs syndrome). You stop breathing briefly during sleep (sleep apnea). You think you have a sleep disorder or are taking a medicine that is affecting your quality of sleep. Summary Most adults need 7-8 hours of quality sleep each night. Getting enough quality sleep is important for your mental and physical  health. Make your bedroom a place that promotes quality sleep, and avoid things that may cause you to have poor sleep, such as alcohol , caffeine, smoking, or large meals. Talk to your health care provider if you have trouble falling asleep or staying asleep. This information is not intended to replace advice given to you by your health care provider. Make sure you discuss any questions you have with your health care provider. Document Revised: 09/20/2021 Document Reviewed: 09/20/2021 Elsevier Patient Education  2024 Elsevier Inc.    Non - refreshing, non restorative sleep  in the setting of  chronic Insomnia, medication is needed for sleep initiation- here to rule out organic causes:   Risk factors for OSA were not present, including : Body mass index is 20.07 kg/m., small neck size , normal  upper airway anatomy.  PLMs, RLS related to regional pain syndrome.   Vivid dreams when not taking benzos  Excessive fatigue and sleepiness , EDS when not taking amphetamine     No sleep paralysis, no hypnagogic hallucinations and no sleep paralysis.   My Plan is to proceed with:  PSG, attended sleep study  to  screen for all organic causes that could affect sleep quality and headache disorder.   I plan to follow up personally  or through our NP within 4-5 months if the PSG is showing any organic disorder.  She will take Trazodone to the sleep.   A total time of  45  minutes consistent of a part of face to face encounter , exam and interview,  and additional preparation time for chart review was spent .

## 2024-02-26 ENCOUNTER — Telehealth: Payer: Self-pay | Admitting: Neurology

## 2024-02-26 NOTE — Telephone Encounter (Signed)
 I spoke with the patient.  NPSG [name] [name]: [id number] (exp. 02/21/24 to 05/20/24)  Patient is scheduled at [facility] for 03/22/24 at 9 pm.  Mailed packet and sent mychart.

## 2024-02-28 ENCOUNTER — Other Ambulatory Visit: Payer: Self-pay | Admitting: Physician Assistant

## 2024-02-28 DIAGNOSIS — R590 Localized enlarged lymph nodes: Secondary | ICD-10-CM

## 2024-02-28 NOTE — Progress Notes (Signed)
 CT CAP ordered per Dr. Lafonda plan. Please refer to telephone note from 11/08/23. Patient is scheduled to follow up with him in clinic 04/06/24 to discuss results.

## 2024-03-02 ENCOUNTER — Encounter: Payer: Self-pay | Admitting: Adult Health

## 2024-03-03 NOTE — Telephone Encounter (Signed)
 Insurance won't allow sooner than 12 weeks, which would be 10/20 for her.

## 2024-03-04 ENCOUNTER — Other Ambulatory Visit: Payer: Self-pay | Admitting: Medical Oncology

## 2024-03-04 DIAGNOSIS — R590 Localized enlarged lymph nodes: Secondary | ICD-10-CM

## 2024-03-05 DIAGNOSIS — G43109 Migraine with aura, not intractable, without status migrainosus: Secondary | ICD-10-CM | POA: Diagnosis not present

## 2024-03-05 DIAGNOSIS — H52223 Regular astigmatism, bilateral: Secondary | ICD-10-CM | POA: Diagnosis not present

## 2024-03-05 DIAGNOSIS — H524 Presbyopia: Secondary | ICD-10-CM | POA: Diagnosis not present

## 2024-03-05 DIAGNOSIS — H5213 Myopia, bilateral: Secondary | ICD-10-CM | POA: Diagnosis not present

## 2024-03-05 DIAGNOSIS — H1045 Other chronic allergic conjunctivitis: Secondary | ICD-10-CM | POA: Diagnosis not present

## 2024-03-17 ENCOUNTER — Telehealth: Payer: Self-pay | Admitting: Adult Health

## 2024-03-17 DIAGNOSIS — G43711 Chronic migraine without aura, intractable, with status migrainosus: Secondary | ICD-10-CM

## 2024-03-17 NOTE — Telephone Encounter (Signed)
 Submitted auth request via CMM to transition pt to SP, status is pending. Key: [name] [name]

## 2024-03-18 ENCOUNTER — Encounter: Payer: Self-pay | Admitting: Neurology

## 2024-03-19 ENCOUNTER — Encounter (HOSPITAL_BASED_OUTPATIENT_CLINIC_OR_DEPARTMENT_OTHER): Payer: Self-pay | Admitting: General Surgery

## 2024-03-19 ENCOUNTER — Ambulatory Visit: Payer: Self-pay | Admitting: General Surgery

## 2024-03-19 DIAGNOSIS — K625 Hemorrhage of anus and rectum: Secondary | ICD-10-CM

## 2024-03-19 NOTE — H&P (View-Only) (Signed)
 REFERRING PHYSICIAN:  Marilynne Browning Nic*   PROVIDER:  BERNARDA WANDA NED, MD   MRN: [id number] DOB: 23-Oct-1965    Subjective    Chief Complaint: New Consultation (Rectal prolapse/enlarged lt inguinal hernia)       History of Present Illness: [name] [name] is a 58 y.o. female who is seen today as an office consultation at the request of Dr. Marilynne for evaluation of New Consultation (Rectal prolapse/enlarged lt inguinal lymph node) .   58 year old female with longstanding industry of prolapsing hemorrhoids who presents to the office with her mentally bleeding prolapsing hemorrhoids.  This has gotten worse over the last few months.  She also has regional pain syndrome and requires Aleve for good control, but this seems to flareup her hemorrhoids and make them bleed.  She was also noted to have an enlarged lymph node on recent physical exam.       Review of Systems: A complete review of systems was obtained from the patient.  I have reviewed this information and discussed as appropriate with the patient.  See HPI as well for other ROS.     Medical History: Past Medical History      Past Medical History:  Diagnosis Date   Anxiety     GERD (gastroesophageal reflux disease)           Problem List  There is no problem list on file for this patient.      Past Surgical History       Past Surgical History:  Procedure Laterality Date   CHOLECYSTECTOMY       RIGHT SHOULDER ARTHROSCOPY       WISDOM TEETH            Allergies       Allergies  Allergen Reactions   Levaquin [Levofloxacin] Rash   Reglan [Metoclopramide] Other (See Comments)      Anxiety, confusion, and restlessness        Medications Ordered Prior to Encounter        Current Outpatient Medications on File Prior to Visit  Medication Sig Dispense Refill   ALPRAZolam (XANAX) 0.5 MG tablet Take 0.5 mg by mouth       cetirizine (ZYRTEC) 10 MG chewable tablet Take by mouth        dextroamphetamine sulfate 30 mg Tab Take by mouth       onabotulinumtoxinA  (BOTOX ) 100 unit injection Provider to inject 155 units into the muscles of the head and neck every 3 months. Discard remainder.       PARoxetine (PAXIL) 30 MG tablet Take 20 mg by mouth once daily       progesterone (PROMETRIUM) 200 MG capsule progesterone micronized 200 mg capsule TAKE 1 CAPSULE (200MG ) BY MOUTH DAILY AFTER DINNER       traZODone (DESYREL) 50 MG tablet Take 75 mg by mouth        No current facility-administered medications on file prior to visit.        Family History       Family History  Problem Relation Age of Onset   High blood pressure (Hypertension) Mother     Hyperlipidemia (Elevated cholesterol) Mother     Diabetes Mother     Coronary Artery Disease (Blocked arteries around heart) Father     Skin cancer Father     Diabetes Sister          Tobacco Use History  Social History       Tobacco Use  Smoking  Status Never  Smokeless Tobacco Never        Social History  Social History        Socioeconomic History   Marital status: Married  Tobacco Use   Smoking status: Never   Smokeless tobacco: Never  Vaping Use   Vaping status: Never Used  Substance and Sexual Activity   Alcohol  use: Yes   Drug use: Never    Social Drivers of Health        Housing Stability: Unknown (09/02/2023)    Housing Stability Vital Sign     Homeless in the Last Year: No        Objective:         Vitals:     BP: 102/72  Pulse: (!) 118  Temp: 36.4 C (97.6 F)  SpO2: 99%  Weight: 51.9 kg (114 lb 6.4 oz)  Height: 154.9 cm (5' 1)  PainSc: 0-No pain      Exam Gen: NAD Abd: soft Ing: Palpable lymph node noted in left inguinal region approximately 3 cm in length     Labs, Imaging and Diagnostic Testing:   Procedure: Anoscopy 09/02/2023 Surgeon: Debby After the risks and benefits were explained, written consent was obtained for above procedure.  A medical assistant chaperone  was present thoroughout the entire procedure.  Anesthesia: none Diagnosis: rectal bleeding Findings: Grade 3 right posterior right anterior hemorrhoids with active inflammation, grade 1 left lateral hemorrhoid   Assessment and Plan:  Diagnoses and all orders for this visit:   Prolapsed internal hemorrhoids, grade 82   58 year old female who presents to the office for evaluation of prolapsing hemorrhoids and rectal bleeding.  On exam today she does have grade 3 inflamed hemorrhoids.  She has been putting off surgery due to her pain syndrome.  I think that surgery would help her with her rectal pain and bleeding in the long run.  We discussed the typical postoperative pain associated with this type of surgery.  I recommended proceeding with trans hemorrhoidal dearterialization.  We discussed the risk of recurrence as well as bleeding and pain.  All questions were answered.   Bernarda JAYSON Debby, MD Colon and Rectal Surgery [facility]

## 2024-03-19 NOTE — H&P (Signed)
 REFERRING PHYSICIAN:  Marilynne Browning Nic*   PROVIDER:  BERNARDA WANDA NED, MD   MRN: [id number] DOB: 23-Oct-1965    Subjective    Chief Complaint: New Consultation (Rectal prolapse/enlarged lt inguinal hernia)       History of Present Illness: [name] [name] is a 58 y.o. female who is seen today as an office consultation at the request of Dr. Marilynne for evaluation of New Consultation (Rectal prolapse/enlarged lt inguinal lymph node) .   58 year old female with longstanding industry of prolapsing hemorrhoids who presents to the office with her mentally bleeding prolapsing hemorrhoids.  This has gotten worse over the last few months.  She also has regional pain syndrome and requires Aleve for good control, but this seems to flareup her hemorrhoids and make them bleed.  She was also noted to have an enlarged lymph node on recent physical exam.       Review of Systems: A complete review of systems was obtained from the patient.  I have reviewed this information and discussed as appropriate with the patient.  See HPI as well for other ROS.     Medical History: Past Medical History      Past Medical History:  Diagnosis Date   Anxiety     GERD (gastroesophageal reflux disease)           Problem List  There is no problem list on file for this patient.      Past Surgical History       Past Surgical History:  Procedure Laterality Date   CHOLECYSTECTOMY       RIGHT SHOULDER ARTHROSCOPY       WISDOM TEETH            Allergies       Allergies  Allergen Reactions   Levaquin [Levofloxacin] Rash   Reglan [Metoclopramide] Other (See Comments)      Anxiety, confusion, and restlessness        Medications Ordered Prior to Encounter        Current Outpatient Medications on File Prior to Visit  Medication Sig Dispense Refill   ALPRAZolam (XANAX) 0.5 MG tablet Take 0.5 mg by mouth       cetirizine (ZYRTEC) 10 MG chewable tablet Take by mouth        dextroamphetamine sulfate 30 mg Tab Take by mouth       onabotulinumtoxinA  (BOTOX ) 100 unit injection Provider to inject 155 units into the muscles of the head and neck every 3 months. Discard remainder.       PARoxetine (PAXIL) 30 MG tablet Take 20 mg by mouth once daily       progesterone (PROMETRIUM) 200 MG capsule progesterone micronized 200 mg capsule TAKE 1 CAPSULE (200MG ) BY MOUTH DAILY AFTER DINNER       traZODone (DESYREL) 50 MG tablet Take 75 mg by mouth        No current facility-administered medications on file prior to visit.        Family History       Family History  Problem Relation Age of Onset   High blood pressure (Hypertension) Mother     Hyperlipidemia (Elevated cholesterol) Mother     Diabetes Mother     Coronary Artery Disease (Blocked arteries around heart) Father     Skin cancer Father     Diabetes Sister          Tobacco Use History  Social History       Tobacco Use  Smoking  Status Never  Smokeless Tobacco Never        Social History  Social History        Socioeconomic History   Marital status: Married  Tobacco Use   Smoking status: Never   Smokeless tobacco: Never  Vaping Use   Vaping status: Never Used  Substance and Sexual Activity   Alcohol  use: Yes   Drug use: Never    Social Drivers of Health        Housing Stability: Unknown (09/02/2023)    Housing Stability Vital Sign     Homeless in the Last Year: No        Objective:         Vitals:     BP: 102/72  Pulse: (!) 118  Temp: 36.4 C (97.6 F)  SpO2: 99%  Weight: 51.9 kg (114 lb 6.4 oz)  Height: 154.9 cm (5' 1)  PainSc: 0-No pain      Exam Gen: NAD Abd: soft Ing: Palpable lymph node noted in left inguinal region approximately 3 cm in length     Labs, Imaging and Diagnostic Testing:   Procedure: Anoscopy 09/02/2023 Surgeon: Debby After the risks and benefits were explained, written consent was obtained for above procedure.  A medical assistant chaperone  was present thoroughout the entire procedure.  Anesthesia: none Diagnosis: rectal bleeding Findings: Grade 3 right posterior right anterior hemorrhoids with active inflammation, grade 1 left lateral hemorrhoid   Assessment and Plan:  Diagnoses and all orders for this visit:   Prolapsed internal hemorrhoids, grade 82   58 year old female who presents to the office for evaluation of prolapsing hemorrhoids and rectal bleeding.  On exam today she does have grade 3 inflamed hemorrhoids.  She has been putting off surgery due to her pain syndrome.  I think that surgery would help her with her rectal pain and bleeding in the long run.  We discussed the typical postoperative pain associated with this type of surgery.  I recommended proceeding with trans hemorrhoidal dearterialization.  We discussed the risk of recurrence as well as bleeding and pain.  All questions were answered.   Bernarda JAYSON Debby, MD Colon and Rectal Surgery [facility]

## 2024-03-20 ENCOUNTER — Encounter (HOSPITAL_BASED_OUTPATIENT_CLINIC_OR_DEPARTMENT_OTHER)
Admission: RE | Admit: 2024-03-20 | Discharge: 2024-03-20 | Disposition: A | Discharge status: Home / Self Care | Source: Ambulatory Visit | Attending: General Surgery | Admitting: General Surgery

## 2024-03-20 DIAGNOSIS — Z01818 Encounter for other preprocedural examination: Secondary | ICD-10-CM | POA: Diagnosis present

## 2024-03-20 DIAGNOSIS — K625 Hemorrhage of anus and rectum: Secondary | ICD-10-CM | POA: Diagnosis not present

## 2024-03-20 DIAGNOSIS — Z01812 Encounter for preprocedural laboratory examination: Secondary | ICD-10-CM | POA: Diagnosis not present

## 2024-03-20 LAB — CBC WITH DIFFERENTIAL/PLATELET
Abs Immature Granulocytes: 0.02 K/uL (ref 0.00–0.07)
Basophils Absolute: 0 K/uL (ref 0.0–0.1)
Basophils Relative: 1 %
Eosinophils Absolute: 0.1 K/uL (ref 0.0–0.5)
Eosinophils Relative: 2 %
HCT: 37.6 % (ref 36.0–46.0)
Hemoglobin: 12.2 g/dL (ref 12.0–15.0)
Immature Granulocytes: 0 %
Lymphocytes Relative: 19 %
Lymphs Abs: 1.2 K/uL (ref 0.7–4.0)
MCH: 28.9 pg (ref 26.0–34.0)
MCHC: 32.4 g/dL (ref 30.0–36.0)
MCV: 89.1 fL (ref 80.0–100.0)
Monocytes Absolute: 0.4 K/uL (ref 0.1–1.0)
Monocytes Relative: 6 %
Neutro Abs: 4.6 K/uL (ref 1.7–7.7)
Neutrophils Relative %: 72 %
Platelets: 280 K/uL (ref 150–400)
RBC: 4.22 MIL/uL (ref 3.87–5.11)
RDW: 12.9 % (ref 11.5–15.5)
WBC: 6.4 K/uL (ref 4.0–10.5)
nRBC: 0 % (ref 0.0–0.2)

## 2024-03-22 ENCOUNTER — Encounter

## 2024-03-24 ENCOUNTER — Encounter (HOSPITAL_COMMUNITY): Payer: Self-pay | Admitting: General Surgery

## 2024-03-24 ENCOUNTER — Other Ambulatory Visit: Payer: Self-pay

## 2024-03-24 NOTE — Progress Notes (Signed)
 For Anesthesia: PCP - Charlie LELON Reas, MD  Cardiologist - Ladona Heinz, MD  Neurologist- Dohmeier, Dedra, MD   Bowel Prep reminder:N/A  Chest x-ray - greater than 1 year EKG - 11/19/23 in [facility] Stress Test - N/A ECHO - N/A Cardiac Cath - N/A Pacemaker/ICD device last checked:N/A Pacemaker orders received:N/A Device Rep notified:N/A  Spinal Cord Stimulator:N/A  Sleep Study - N/A CPAP -   Fasting Blood Sugar - N/A Checks Blood Sugar ___N/A__ times a day Date and result of last Hgb A1c-N/A  Last dose of GLP1 agonist- N/A GLP1 instructions: Hold 7 days prior to schedule (Hold 24 hours-daily)   Last dose of SGLT-2 inhibitors- N/A SGLT-2 instructions: Hold 72 hours prior to surgery  Blood Thinner Instructions: N/A Last Dose:N/A Time last taken:N/A  Aspirin Instructions: N/A Last Dose:N/A Time last taken:N/A  Activity level: Can go up a flight of stairs and activities of daily living without stopping and without chest pain and/or shortness of breath    Anesthesia review: N/A  Patient denies shortness of breath, fever, cough and chest pain at PAT appointment   Patient verbalized understanding of instructions that were reviewed over the telephone.

## 2024-03-25 ENCOUNTER — Other Ambulatory Visit: Payer: Self-pay

## 2024-03-25 MED ORDER — BOTOX 100 UNITS IJ SOLR
INTRAMUSCULAR | 3 refills | Status: AC
  Filled 2024-03-25: qty 2, fill #0
  Filled 2024-04-09: qty 2, 84d supply, fill #0
  Filled 2024-04-27: qty 2, 30d supply, fill #0

## 2024-03-25 NOTE — Telephone Encounter (Signed)
 Received approval, please send rx to [facility].  Auth#: [id number] (06/12/23-06/10/25)

## 2024-03-25 NOTE — Telephone Encounter (Signed)
 Botox 200 unit refill sent to [facility]

## 2024-03-25 NOTE — Addendum Note (Signed)
 Addended by: HILLIARD HEATHER CROME on: 03/25/2024 08:50 AM   Modules accepted: Orders

## 2024-03-26 ENCOUNTER — Other Ambulatory Visit (HOSPITAL_COMMUNITY): Payer: Self-pay

## 2024-03-26 ENCOUNTER — Other Ambulatory Visit: Payer: Self-pay

## 2024-03-26 NOTE — Anesthesia Preprocedure Evaluation (Addendum)
 Anesthesia Evaluation  Patient identified by MRN, date of birth, ID band Patient awake    Reviewed: Allergy  & Precautions, NPO status , Patient's Chart, lab work & pertinent test results  History of Anesthesia Complications Negative for: history of anesthetic complications  Airway Mallampati: II  TM Distance: >3 FB Neck ROM: Full    Dental  (+) Dental Advisory Given, Teeth Intact   Pulmonary   Sarcoidosis    Pulmonary exam normal        Cardiovascular negative cardio ROS Normal cardiovascular exam     Neuro/Psych  Headaches PSYCHIATRIC DISORDERS Anxiety      Brain fog   Neuromuscular disease    GI/Hepatic Neg liver ROS,,, Hemorrhoids    Endo/Other  negative endocrine ROS    Renal/GU negative Renal ROS     Musculoskeletal negative musculoskeletal ROS (+)    Abdominal   Peds  Hematology negative hematology ROS (+)   Anesthesia Other Findings   Reproductive/Obstetrics                              Anesthesia Physical Anesthesia Plan  ASA: 2  Anesthesia Plan: MAC   Post-op Pain Management: Tylenol  PO (pre-op)*   Induction:   PONV Risk Score and Plan: 2 and Propofol  infusion and Treatment may vary due to age or medical condition  Airway Management Planned: Natural Airway and Simple Face Mask  Additional Equipment: None  Intra-op Plan:   Post-operative Plan:   Informed Consent: I have reviewed the patients History and Physical, chart, labs and discussed the procedure including the risks, benefits and alternatives for the proposed anesthesia with the patient or authorized representative who has indicated his/her understanding and acceptance.       Plan Discussed with: CRNA and Anesthesiologist  Anesthesia Plan Comments:          Anesthesia Quick Evaluation

## 2024-03-26 NOTE — Telephone Encounter (Signed)
 If SP is an option then we can't do BB. Can schedule appointment to discuss other options.

## 2024-03-26 NOTE — Telephone Encounter (Signed)
 Monica reached out and let me know this pt's copay with SP is $550.75. Can you look and see if she would be a candidate for alternative treatment or if it's reasonable to leave her as B/B Botox ?

## 2024-03-27 ENCOUNTER — Ambulatory Visit (HOSPITAL_COMMUNITY)
Admission: RE | Admit: 2024-03-27 | Discharge: 2024-03-27 | Disposition: A | Discharge status: Home / Self Care | Attending: General Surgery | Admitting: General Surgery

## 2024-03-27 ENCOUNTER — Other Ambulatory Visit: Payer: Self-pay

## 2024-03-27 ENCOUNTER — Ambulatory Visit (HOSPITAL_BASED_OUTPATIENT_CLINIC_OR_DEPARTMENT_OTHER): Payer: Self-pay | Admitting: Anesthesiology

## 2024-03-27 ENCOUNTER — Ambulatory Visit (HOSPITAL_COMMUNITY): Payer: Self-pay | Admitting: Anesthesiology

## 2024-03-27 ENCOUNTER — Encounter (HOSPITAL_COMMUNITY)
Admission: RE | Disposition: A | Discharge status: Home / Self Care | Payer: Self-pay | Source: Home / Self Care | Attending: General Surgery

## 2024-03-27 ENCOUNTER — Encounter (HOSPITAL_COMMUNITY): Payer: Self-pay | Admitting: General Surgery

## 2024-03-27 DIAGNOSIS — D869 Sarcoidosis, unspecified: Secondary | ICD-10-CM | POA: Insufficient documentation

## 2024-03-27 DIAGNOSIS — K642 Third degree hemorrhoids: Secondary | ICD-10-CM

## 2024-03-27 DIAGNOSIS — F419 Anxiety disorder, unspecified: Secondary | ICD-10-CM | POA: Insufficient documentation

## 2024-03-27 DIAGNOSIS — K625 Hemorrhage of anus and rectum: Secondary | ICD-10-CM

## 2024-03-27 DIAGNOSIS — R519 Headache, unspecified: Secondary | ICD-10-CM | POA: Insufficient documentation

## 2024-03-27 DIAGNOSIS — Z01818 Encounter for other preprocedural examination: Secondary | ICD-10-CM

## 2024-03-27 HISTORY — DX: Non-Hodgkin lymphoma, unspecified, unspecified site: C85.90

## 2024-03-27 HISTORY — PX: TRANSANAL HEMORRHOIDAL DEARTERIALIZATION: SHX6136

## 2024-03-27 SURGERY — TRANSANAL HEMORRHOIDAL DEARTERIALIZATION
Anesthesia: Monitor Anesthesia Care | Site: Rectum

## 2024-03-27 MED ORDER — MIDAZOLAM HCL 5 MG/5ML IJ SOLN
INTRAMUSCULAR | Status: DC | PRN
  Administered 2024-03-27: 2 mg via INTRAVENOUS

## 2024-03-27 MED ORDER — FENTANYL CITRATE (PF) 50 MCG/ML IJ SOSY
PREFILLED_SYRINGE | INTRAMUSCULAR | Status: AC
  Filled 2024-03-27: qty 2

## 2024-03-27 MED ORDER — PROPOFOL 10 MG/ML IV BOLUS
INTRAVENOUS | Status: AC
  Filled 2024-03-27: qty 20

## 2024-03-27 MED ORDER — PROPOFOL 500 MG/50ML IV EMUL
INTRAVENOUS | Status: AC
  Filled 2024-03-27: qty 50

## 2024-03-27 MED ORDER — SODIUM CHLORIDE 0.9% FLUSH: 3.0000 mL | Freq: Two times a day (BID) | INTRAVENOUS | Status: DC

## 2024-03-27 MED ORDER — 0.9 % SODIUM CHLORIDE (POUR BTL) OPTIME
TOPICAL | Status: DC | PRN
  Administered 2024-03-27: 1000 mL

## 2024-03-27 MED ORDER — STERILE WATER FOR IRRIGATION IR SOLN
Status: DC | PRN
  Administered 2024-03-27: 1000 mL

## 2024-03-27 MED ORDER — BUPIVACAINE LIPOSOME 1.3 % IJ SUSP
INTRAMUSCULAR | Status: AC
  Filled 2024-03-27: qty 20

## 2024-03-27 MED ORDER — BUPIVACAINE-EPINEPHRINE (PF) 0.5% -1:200000 IJ SOLN
INTRAMUSCULAR | Status: AC
  Filled 2024-03-27: qty 30

## 2024-03-27 MED ORDER — CELECOXIB 200 MG PO CAPS
200.0000 mg | ORAL_CAPSULE | ORAL | Status: AC
  Administered 2024-03-27: 200 mg via ORAL
  Filled 2024-03-27: qty 1

## 2024-03-27 MED ORDER — ONDANSETRON HCL 4 MG/2ML IJ SOLN
INTRAMUSCULAR | Status: DC | PRN
  Administered 2024-03-27: 4 mg via INTRAVENOUS

## 2024-03-27 MED ORDER — ONDANSETRON HCL 4 MG/2ML IJ SOLN
INTRAMUSCULAR | Status: AC
  Filled 2024-03-27: qty 2

## 2024-03-27 MED ORDER — ACETAMINOPHEN 650 MG RE SUPP: 650.0000 mg | RECTAL | Status: DC | PRN

## 2024-03-27 MED ORDER — OXYCODONE HCL 5 MG PO TABS: 5.0000 mg | ORAL_TABLET | Freq: Four times a day (QID) | ORAL | 0 refills | Status: DC | PRN

## 2024-03-27 MED ORDER — DEXAMETHASONE SODIUM PHOSPHATE 4 MG/ML IJ SOLN
INTRAMUSCULAR | Status: DC | PRN
  Administered 2024-03-27: 4 mg via INTRAVENOUS

## 2024-03-27 MED ORDER — SODIUM CHLORIDE 0.9 % IV SOLN: 250.0000 mL | INTRAVENOUS | Status: DC | PRN

## 2024-03-27 MED ORDER — PROPOFOL 500 MG/50ML IV EMUL
INTRAVENOUS | Status: DC | PRN
  Administered 2024-03-27: 180 ug/kg/min via INTRAVENOUS

## 2024-03-27 MED ORDER — AMISULPRIDE (ANTIEMETIC) 5 MG/2ML IV SOLN: 10.0000 mg | Freq: Once | INTRAVENOUS | Status: DC | PRN

## 2024-03-27 MED ORDER — LIDOCAINE HCL (PF) 2 % IJ SOLN
INTRAMUSCULAR | Status: AC
  Filled 2024-03-27: qty 5

## 2024-03-27 MED ORDER — OXYCODONE HCL 5 MG PO TABS
ORAL_TABLET | ORAL | Status: AC
  Filled 2024-03-27: qty 1

## 2024-03-27 MED ORDER — OXYCODONE HCL 5 MG PO TABS: 5.0000 mg | ORAL_TABLET | ORAL | Status: DC | PRN

## 2024-03-27 MED ORDER — SODIUM CHLORIDE 0.9% FLUSH: 3.0000 mL | INTRAVENOUS | Status: DC | PRN

## 2024-03-27 MED ORDER — FENTANYL CITRATE (PF) 100 MCG/2ML IJ SOLN
INTRAMUSCULAR | Status: AC
  Filled 2024-03-27: qty 2

## 2024-03-27 MED ORDER — BUPIVACAINE-EPINEPHRINE 0.5% -1:200000 IJ SOLN
INTRAMUSCULAR | Status: DC | PRN
  Administered 2024-03-27: 50 mL

## 2024-03-27 MED ORDER — SODIUM CHLORIDE 0.9 % IV SOLN: 12.5000 mg | INTRAVENOUS | Status: DC | PRN

## 2024-03-27 MED ORDER — OXYCODONE HCL 5 MG PO TABS
5.0000 mg | ORAL_TABLET | Freq: Once | ORAL | Status: AC | PRN
  Administered 2024-03-27: 5 mg via ORAL

## 2024-03-27 MED ORDER — ACETAMINOPHEN 325 MG PO TABS: 650.0000 mg | ORAL_TABLET | ORAL | Status: DC | PRN

## 2024-03-27 MED ORDER — LACTATED RINGERS IV SOLN: INTRAVENOUS | Status: DC

## 2024-03-27 MED ORDER — MIDAZOLAM HCL 2 MG/2ML IJ SOLN
INTRAMUSCULAR | Status: AC
  Filled 2024-03-27: qty 2

## 2024-03-27 MED ORDER — BUPIVACAINE LIPOSOME 1.3 % IJ SUSP: 20.0000 mL | Freq: Once | INTRAMUSCULAR | Status: DC

## 2024-03-27 MED ORDER — FENTANYL CITRATE (PF) 100 MCG/2ML IJ SOLN
INTRAMUSCULAR | Status: DC | PRN
  Administered 2024-03-27: 25 ug via INTRAVENOUS
  Administered 2024-03-27: 50 ug via INTRAVENOUS
  Administered 2024-03-27: 25 ug via INTRAVENOUS

## 2024-03-27 MED ORDER — ACETAMINOPHEN 500 MG PO TABS
1000.0000 mg | ORAL_TABLET | ORAL | Status: AC
  Administered 2024-03-27: 1000 mg via ORAL
  Filled 2024-03-27: qty 2

## 2024-03-27 MED ORDER — OXYCODONE HCL 5 MG/5ML PO SOLN: 5.0000 mg | Freq: Once | ORAL | Status: AC | PRN

## 2024-03-27 MED ORDER — FENTANYL CITRATE (PF) 50 MCG/ML IJ SOSY
25.0000 ug | PREFILLED_SYRINGE | INTRAMUSCULAR | Status: DC | PRN
  Administered 2024-03-27 (×2): 50 ug via INTRAVENOUS

## 2024-03-27 MED ADMIN — Gabapentin Cap 300 MG: 300 mg | ORAL | NDC 60687059111

## 2024-03-27 MED ADMIN — Lidocaine HCl Local Soln Prefilled Syringe 100 MG/5ML (2%): 40 mg | INTRAVENOUS | NDC 70004072309

## 2024-03-27 MED FILL — Gabapentin Cap 300 MG: 300.0000 mg | ORAL | Qty: 1 | Status: AC

## 2024-03-27 SURGICAL SUPPLY — 27 items
BAG COUNTER SPONGE SURGICOUNT (BAG) IMPLANT
BLADE HEX COATED 2.75 (ELECTRODE) ×1 IMPLANT
BRIEF MESH DISP LRG (UNDERPADS AND DIAPERS) IMPLANT
ELECT REM PT RETURN 15FT ADLT (MISCELLANEOUS) ×1 IMPLANT
GAUZE 4X4 16PLY ~~LOC~~+RFID DBL (SPONGE) ×1 IMPLANT
GAUZE PAD ABD 8X10 STRL (GAUZE/BANDAGES/DRESSINGS) IMPLANT
GAUZE SPONGE 4X4 12PLY STRL (GAUZE/BANDAGES/DRESSINGS) IMPLANT
GLOVE BIO SURGEON STRL SZ 6.5 (GLOVE) ×1 IMPLANT
GLOVE INDICATOR 6.5 STRL GRN (GLOVE) ×2 IMPLANT
GOWN STRL REUS W/ TWL XL LVL3 (GOWN DISPOSABLE) ×2 IMPLANT
HEMOSTAT SURGICEL 4X8 (HEMOSTASIS) IMPLANT
KIT SLIDE ONE PROLAPS HEMORR (KITS) IMPLANT
KIT TURNOVER KIT A (KITS) ×1 IMPLANT
NDL HYPO 22X1.5 SAFETY MO (MISCELLANEOUS) ×1 IMPLANT
NEEDLE HYPO 22X1.5 SAFETY MO (MISCELLANEOUS) ×1 IMPLANT
PACK LITHOTOMY IV (CUSTOM PROCEDURE TRAY) ×1 IMPLANT
PENCIL SMOKE EVACUATOR (MISCELLANEOUS) ×1 IMPLANT
SPIKE FLUID TRANSFER (MISCELLANEOUS) ×1 IMPLANT
SPONGE HEMORRHOID 8X3CM (HEMOSTASIS) IMPLANT
SPONGE SURGIFOAM ABS GEL 100 (HEMOSTASIS) IMPLANT
SURGILUBE 2OZ TUBE FLIPTOP (MISCELLANEOUS) ×1 IMPLANT
SUT CHROMIC 2 0 SH (SUTURE) IMPLANT
SUT CHROMIC 3 0 SH 27 (SUTURE) IMPLANT
SUT VIC AB 2-0 UR6 27 (SUTURE) IMPLANT
SYR 20ML LL LF (SYRINGE) ×1 IMPLANT
TOWEL OR 17X26 10 PK STRL BLUE (TOWEL DISPOSABLE) ×1 IMPLANT
YANKAUER SUCT BULB TIP 10FT TU (MISCELLANEOUS) ×1 IMPLANT

## 2024-03-27 NOTE — Discharge Instructions (Addendum)

## 2024-03-27 NOTE — Anesthesia Postprocedure Evaluation (Signed)
 Anesthesia Post Note  Patient: [name] [name]  Procedure(s) Performed: TRANSANAL HEMORRHOIDAL DEARTERIALIZATION (Rectum)     Patient location during evaluation: PACU Anesthesia Type: MAC Level of consciousness: awake and alert Pain management: pain level controlled Vital Signs Assessment: post-procedure vital signs reviewed and stable Respiratory status: spontaneous breathing, nonlabored ventilation and respiratory function stable Cardiovascular status: stable and blood pressure returned to baseline Anesthetic complications: no   No notable events documented.  Last Vitals:  Vitals:   03/27/24 0900 03/27/24 0915  BP: 119/75 119/78  Pulse: 84 84  Resp: 11 13  Temp:    SpO2: 100% 100%    Last Pain:  Vitals:   03/27/24 0915  TempSrc:   PainSc: Asleep                 Debby FORBES Like

## 2024-03-27 NOTE — Interval H&P Note (Signed)
 History and Physical Interval Note:  03/27/2024 7:13 AM  [name] [name]  has presented today for surgery, with the diagnosis of GRADE III HEMORRHOIDS.  The various methods of treatment have been discussed with the patient and family. After consideration of risks, benefits and other options for treatment, the patient has consented to  Procedure(s): TRANSANAL HEMORRHOIDAL DEARTERIALIZATION (N/A) as a surgical intervention.  The patient's history has been reviewed, patient examined, no change in status, stable for surgery.  I have reviewed the patient's chart and labs.  Questions were answered to the patient's satisfaction.     Bernarda JAYSON Ned, MD  Colorectal and General Surgery [facility]

## 2024-03-27 NOTE — Transfer of Care (Signed)
 Immediate Anesthesia Transfer of Care Note  Patient: [name] [name]  Procedure(s) Performed: TRANSANAL HEMORRHOIDAL DEARTERIALIZATION (Rectum)  Patient Location: PACU  Anesthesia Type:MAC  Level of Consciousness: awake and drowsy  Airway & Oxygen Therapy: Patient Spontanous Breathing  Post-op Assessment: Report given to RN  Post vital signs: Reviewed and stable  Last Vitals:  Vitals Value Taken Time  BP 127/78 03/27/24 08:30  Temp    Pulse 97 03/27/24 08:31  Resp 14 03/27/24 08:31  SpO2 100 % 03/27/24 08:31  Vitals shown include unfiled device data.  Last Pain:  Vitals:   03/27/24 0611  TempSrc: Oral  PainSc:          Complications: No notable events documented.

## 2024-03-27 NOTE — Op Note (Signed)
 03/27/2024  8:21 AM  PATIENT:  [name] [name]  58 y.o. female  Patient Care Team: Tisovec, Charlie ORN, MD as PCP - General (Internal Medicine) Ladona Heinz, MD as PCP - Cardiology (Cardiology)  PRE-OPERATIVE DIAGNOSIS:  GRADE III HEMORRHOIDS  POST-OPERATIVE DIAGNOSIS:  GRADE III HEMORRHOIDS  PROCEDURE:  TRANSANAL HEMORRHOIDAL DEARTERIALIZATION   Surgeon(s): Debby Hila, MD  ASSISTANT: none   ANESTHESIA:   local and MAC  EBL:  Total I/O In: 400 [I.V.:400] Out: -   DRAINS: none   SPECIMEN:  No Specimen  DISPOSITION OF SPECIMEN:  N/A  COUNTS:  YES  PLAN OF CARE: Discharge to home after PACU  PATIENT DISPOSITION:  PACU - hemodynamically stable.  INDICATION: 58 y.o. F with grade 3 hemorrhoids and rectal bleeding   OR FINDINGS: grade 3 hemorrhoids, redundant rectal mucosa  Description: Informed consent was confirmed. Patient underwent general anesthesia without difficulty. Patient was placed into lithotomy positioning.  The perianal region was prepped and draped in sterile fashion. Surgical time out confirmed or plan.  I did digital rectal examination and then transitioned over to anoscopy to get a sense of the anatomy.  I switched over to the [facility] fiberoptically lit Doppler anocope.   Using the Doppler on the tip of the THD anoscope, I identified the arterial hemorrhoidal vessels coming in in the classic hexagonal anatomical pattern (right posterior/lateral/anterior, left posterior /lateral/anterior).    I proceeded to ligate the hemorrhoidal arteries. I used a 2-0 Vicryl suture on a UR-6 needle in a figure-of-eight fashion over the signal around 6 cm proximal to the anal verge. I then ran that stitch longitudinally more distally to the dentate line. I then tied that stitch down to cause a hemorrhoidopexy. I did that for all 6 locations.    I redid Doppler anoscopy. I Identified a signal at no other locations.  At completion of this, all hemorrhoids were reduced into  the rectum. There is no more prolapse. External anatomy looked normal.  Hemostasis was good. A dressing was applied.  Patient is being extubated go to recovery room.  I am about to discuss the patient's status to the family.    Hila JAYSON Debby, MD  Colorectal and General Surgery [facility]

## 2024-03-28 ENCOUNTER — Emergency Department (HOSPITAL_COMMUNITY)

## 2024-03-28 ENCOUNTER — Emergency Department (HOSPITAL_COMMUNITY)
Admission: EM | Admit: 2024-03-28 | Discharge: 2024-03-28 | Disposition: A | Discharge status: Home / Self Care | Attending: Emergency Medicine | Admitting: Emergency Medicine

## 2024-03-28 ENCOUNTER — Other Ambulatory Visit: Payer: Self-pay

## 2024-03-28 ENCOUNTER — Encounter (HOSPITAL_COMMUNITY): Payer: Self-pay | Admitting: General Surgery

## 2024-03-28 DIAGNOSIS — G8918 Other acute postprocedural pain: Secondary | ICD-10-CM | POA: Diagnosis not present

## 2024-03-28 DIAGNOSIS — K6289 Other specified diseases of anus and rectum: Secondary | ICD-10-CM | POA: Diagnosis not present

## 2024-03-28 MED ORDER — LIDOCAINE HCL URETHRAL/MUCOSAL 2 % EX GEL
1.0000 | Freq: Once | CUTANEOUS | Status: AC
  Administered 2024-03-28: 1
  Filled 2024-03-28: qty 22

## 2024-03-28 MED ORDER — OXYCODONE HCL 5 MG PO TABS
10.0000 mg | ORAL_TABLET | Freq: Once | ORAL | Status: AC
  Administered 2024-03-28: 10 mg via ORAL
  Filled 2024-03-28: qty 2

## 2024-03-28 NOTE — Discharge Instructions (Signed)
 Your pain discomfort is likely postsurgical pain.  You may alternate between your opiate pain medication and anti-inflammatory medication such as Advil ibuprofen for better pain control.  You may use sitz bath for comfort and call and follow-up closely with your surgeon for further care.

## 2024-03-28 NOTE — ED Notes (Signed)
 Lidocaine  placed at bedside while PA was talking to pt.

## 2024-03-28 NOTE — ED Provider Notes (Signed)
 [place] EMERGENCY DEPARTMENT AT [facility] Provider Note   CSN: [id number] Arrival date & time: 03/28/24  9363     Patient presents with: Post-op Problem   [name] [name] is a 58 y.o. female.   The history is provided by the patient and medical records. No language interpreter was used.     58 year old female with history of partial rectal prolapse, grade 3 hemorrhoid, chronic constipation, anxiety, chronic pain presented to ED from home with complaint of rectal pain.  Yesterday patient underwent a transanal hemorrhoidal dearterialization that was done by Dr. Annabella Ned.  Since the procedure patient reported progressive worsening rectal pain not improved with oxycodone  that was prescribed.  She mention the pain is localized more towards the coccyx region.  Pain does affect the rectum slightly but patient states she is still fairly numb around her anal region from the anesthesia that was given during the procedure.  She was concerns of potential bony injury during the procedure.  She took the opiate medication twice last night without relief.  She noticed minimal bleeding rectally but states that she was told that is normal.  No fever nausea or vomiting.  Prior to Admission medications   Medication Sig Start Date End Date Taking? Authorizing Provider  ALPRAZolam (XANAX) 0.5 MG tablet Take 0.5 mg by mouth at bedtime as needed for anxiety.    [provider]  botulinum toxin Type A  (BOTOX ) 100 units SOLR injection Provider to inject 155 units into the muscles of the head and neck every 3 months. Discard remainder. 03/25/24   Millikan, Megan, NP  Cetirizine HCl (ZYRTEC PO) Take 10 mg by mouth daily as needed.    [provider]  Cholecalciferol (VITAMIN D3 PO) Take 5,000 Int'l Units/L by mouth every other day. Patient state    [provider]  Dextroamphetamine Sulfate 30 MG TABS Take by mouth. Patient taking differently: Take 45 mg by mouth daily.  151mg  in am and 30 in afternoon    [provider]  fluticasone (FLONASE) 50 MCG/ACT nasal spray Place 2 sprays into both nostrils as needed.    [provider]  NONFORMULARY OR COMPOUNDED ITEM Dilitizine Ointment used as needed    [provider]  oxyCODONE  (OXY IR/ROXICODONE ) 5 MG immediate release tablet Take 1-2 tablets (5-10 mg total) by mouth every 6 (six) hours as needed for severe pain (pain score 7-10). 03/27/24   Thomas, Alicia, MD  PARoxetine (PAXIL) 30 MG tablet Take 20 mg by mouth daily. 01/15/22   [provider]  progesterone (PROMETRIUM) 200 MG capsule Take 200 mg by mouth daily. 02/20/21   [provider]  rizatriptan  (MAXALT -MLT) 10 MG disintegrating tablet Take 1 tablet (10 mg total) by mouth as needed for migraine. May repeat in 2 hours if needed 08/03/19   Ines Onetha NOVAK, MD  TRAZODONE HCL PO Take 100 mg by mouth at bedtime.    [provider]  UNABLE TO FIND Biotepellet  inserted every 3 months for hormone replacement    [provider]    Allergies: Penicillins, Chlorhexidine  gluconate, and Reglan [metoclopramide]    Review of Systems  Gastrointestinal:  Positive for rectal pain.    Updated Vital Signs BP (!) 141/95   Pulse 92   Temp 97.7 F (36.5 C) (Oral)   Resp 14   Ht 5' 1 (1.549 m)   Wt 48 kg   LMP 01/05/2011   SpO2 100%   BMI 19.99 kg/m  Physical Exam Vitals and nursing note reviewed.  Constitutional:      General: She is not in acute distress.    Appearance: She is well-developed.     Comments: Patient is laying a left lateral decubitus position appears uncomfortable.  HENT:     Head: Atraumatic.  Eyes:     Conjunctiva/sclera: Conjunctivae normal.  Pulmonary:     Effort: Pulmonary effort is normal.  Abdominal:     Palpations: Abdomen is soft.     Tenderness: There is no abdominal tenderness.  Genitourinary:    Comments: Chaperone present during exam.  Patient has tenderness about  the coccygeal region on palpation.  ecchymosis noted around the perianal region without any active bleeding.  Minimal tenderness to digital rectal exam Musculoskeletal:     Cervical back: Neck supple.  Skin:    Findings: No rash.  Neurological:     Mental Status: She is alert.  Psychiatric:        Mood and Affect: Mood normal.     (all labs ordered are listed, but only abnormal results are displayed) Labs Reviewed - No data to display  EKG: None  Radiology: DG Sacrum/Coccyx Result Date: 03/28/2024 CLINICAL DATA:  Hemorrhoid surgery yesterday with rectal pain. EXAM: SACRUM AND COCCYX - 2+ VIEW COMPARISON:  None Available. FINDINGS: No bony or soft tissue abnormalities in the pelvis or sacral region. Visualized pelvic bowel gas is unremarkable. Clips in the left inguinal region from prior inguinal lymph node biopsy. IMPRESSION: No acute findings. Electronically Signed   By: Marcey Moan M.D.   On: 03/28/2024 09:28     Procedures   Medications Ordered in the ED  lidocaine  (XYLOCAINE ) 2 % jelly 1 Application (has no administration in time range)                                    Medical Decision Making Amount and/or Complexity of Data Reviewed Radiology: ordered.  Risk Prescription drug management.   BP (!) 141/95   Pulse 92   Temp 97.7 F (36.5 C) (Oral)   Resp 14   Ht 5' 1 (1.549 m)   Wt 48 kg   LMP 01/05/2011   SpO2 100%   BMI 19.99 kg/m   46:66 AM  58 year old female with history of partial rectal prolapse, grade 3 hemorrhoid, chronic constipation, anxiety, chronic pain presented to ED from home with complaint of rectal pain.  Yesterday patient underwent a transanal hemorrhoidal dearterialization that was done by Dr. Annabella Ned.  Since the procedure patient reported progressive worsening rectal pain not improved with oxycodone  that was prescribed.  She mention the pain is localized more towards the coccyx region.  Pain does affect the rectum slightly but  patient states she is still fairly numb around her anal region from the anesthesia that was given during the procedure.  She was concerns of potential bony injury during the procedure.  She took the opiate medication twice last night without relief.  She noticed minimal bleeding rectally but states that she was told that is normal.  No fever nausea or vomiting.  On exam patient appears uncomfortable laying on the left lateral decubitus position.  Rectal exam performed with chaperone present.  Patient tenderness is most significant to the coccygeal region on palpation but no bruising there.  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  No active bleeding.  No palpable hemorrhoid.  I appreciate consultation from on-call general surgeon Dr. Ebbie who recommend sitz bath and take additional anti-inflammatory medication along with outpatient follow-up.  Does not think additional workup is indicated at this time.  X-ray of the sacrum and coccyx region is normal.  Agrees with radiology interpretation.  Patient reports minimal improvement of her symptoms with opiate pain medication.  I encouraged patient to alternate between help with medication and anti-inflammatory medication along with sitz bath and outpatient follow-up.  Patient agrees.     Final diagnoses:  Post-operative pain    ED Discharge Orders     None          Nivia Colon, PA-C 03/28/24 1027    Patsey Lot, MD 03/28/24 [phone]

## 2024-03-28 NOTE — ED Triage Notes (Signed)
 Pt to ED from home with c/o rectal pain. Pt had a procedure done on her hemorrhoids yesterday and the pain has been getting progressively worse since. Pt reports no relief with prescribed RX, also reports scant bleeding but states this is nothing she wasn't told to expect. Arrives A+O, VSS.

## 2024-03-31 ENCOUNTER — Ambulatory Visit: Admitting: Adult Health

## 2024-04-03 ENCOUNTER — Other Ambulatory Visit (HOSPITAL_COMMUNITY): Payer: Self-pay | Admitting: General Surgery

## 2024-04-03 DIAGNOSIS — G8918 Other acute postprocedural pain: Secondary | ICD-10-CM

## 2024-04-03 MED ORDER — OXYCODONE HCL 5 MG PO TABS: 5.0000 mg | ORAL_TABLET | Freq: Four times a day (QID) | ORAL | 0 refills | Status: DC | PRN

## 2024-04-06 ENCOUNTER — Ambulatory Visit: Admitting: Hematology and Oncology

## 2024-04-06 ENCOUNTER — Other Ambulatory Visit

## 2024-04-09 ENCOUNTER — Other Ambulatory Visit (HOSPITAL_COMMUNITY): Payer: Self-pay

## 2024-04-09 NOTE — Progress Notes (Signed)
 Specialty Pharmacy Initial Fill Coordination Note  [name] [name] is a 58 y.o. female contacted today regarding initial fill of specialty medication(s) OnabotulinumtoxinA  (Botox )   Patient requested Courier to Provider Office   Delivery date: 04/27/24   Verified address: [facility]  [address] [place], [place] [address]   Medication will be filled on 11.14.25.   Patient is aware of $550.75 copayment.

## 2024-04-09 NOTE — Progress Notes (Signed)
 Initial fill has been completed in [place].

## 2024-04-10 ENCOUNTER — Encounter: Payer: Self-pay | Admitting: Medical Oncology

## 2024-04-10 ENCOUNTER — Other Ambulatory Visit (HOSPITAL_COMMUNITY): Payer: Self-pay

## 2024-04-10 NOTE — Progress Notes (Signed)
 [facility]  Patient called requesting for me to reschedule her imaging appointments till after the first of January 2026, as well as her follow-up appointment with Dr. Federico. Patient states she is recovering from her recent surgery and does not think she will be up to having imaging until then. Patient states she cancelled her imaging appointment herself. Confirmed with patient that we can reschedule imaging for the week of January 5th and follow up with Dr. Federico the week of the 12th of January. Patient confirms that this will be fine. Patient expressed thanks, denies any questions at this time and was encouraged to call with questions/concerns.   MD out of the office today and will be informed when he returns.   Colene KYM Raider, RN, BSN, [facility] Oncology Nurse Navigator, [facility] 04/10/2024 2:23 PM

## 2024-04-13 ENCOUNTER — Ambulatory Visit (HOSPITAL_COMMUNITY)

## 2024-04-14 ENCOUNTER — Ambulatory Visit: Admitting: Adult Health

## 2024-04-19 NOTE — Progress Notes (Deleted)
 [facility] [phone]   [phone]  PROGRESS NOTE  Patient Care Team: Tisovec, Charlie ORN, MD as PCP - General (Internal Medicine) Ladona Heinz, MD as PCP - Cardiology (Cardiology)  Hematological/Oncological History # ***  Interval History:  [name] [name] 58 y.o. female with medical history significant for *** presents for a follow up visit. The patient's last visit was on ***. In the interim since the last visit ***  MEDICAL HISTORY:  Past Medical History:  Diagnosis Date   Anxiety    Brain fog    Chronic pain    COMPLEX REGIONAL PAIN SYNDROME   Hemorrhoids    History of neck problems    dry needling being done & PT   Leg cramping 2020   right, says sometimes she has a hard time putting pressure on that leg    Lymphoma (HCC)    Migraine headache    Sarcoidosis     SURGICAL HISTORY: Past Surgical History:  Procedure Laterality Date   CHOLECYSTECTOMY     DILATION AND CURETTAGE OF UTERUS     ESOPHAGEAL MANOMETRY N/A 05/05/2018   Procedure: ESOPHAGEAL MANOMETRY (EM);  Surgeon: Rosalie Kitchens, MD;  Location: [facility];  Service: Endoscopy;  Laterality: N/A;   LYMPH NODE BIOPSY Left 10/16/2023   Procedure: LYMPH NODE BIOPSY;  Surgeon: Aron Shoulders, MD;  Location: [facility];  Service: General;  Laterality: Left;  LEFT INGUINAL LYMPH NODE EXCISIONAL BIOPSY   PERINEOPLASTY     had scarring after her delivery   SHOULDER ARTHROSCOPY Left 06/11/2006   THORACOSCOPY Left    TONSILLECTOMY AND ADENOIDECTOMY     AT AGE 52   TRANSANAL HEMORRHOIDAL DEARTERIALIZATION N/A 03/27/2024   Procedure: TRANSANAL HEMORRHOIDAL DEARTERIALIZATION;  Surgeon: Debby Hila, MD;  Location: [facility];  Service: General;  Laterality: N/A;   WISDOM TOOTH EXTRACTION      SOCIAL HISTORY: Social History   Socioeconomic History   Marital status: Married    Spouse name: Not on file   Number of children: Not on file   Years of education: Not on  file   Highest education level: Master's degree (e.g., MA, MS, MEng, MEd, MSW, MBA)  Occupational History   Not on file  Tobacco Use   Smoking status: Never   Smokeless tobacco: Never  Vaping Use   Vaping status: Never Used  Substance and Sexual Activity   Alcohol  use: Yes    Alcohol /week: 1.0 standard drink of alcohol     Types: 1 Standard drinks or equivalent per week    Comment: occasionally   Drug use: No   Sexual activity: Yes    Partners: Male  Other Topics Concern   Not on file  Social History Narrative   Lives at home with husband and daughter   Right handed   Caffeine: 1 cup/day   Social Drivers of Corporate Investment Banker Strain: Not on file  Food Insecurity: Not on file  Transportation Needs: Not on file  Physical Activity: Not on file  Stress: Not on file  Social Connections: Not on file  Intimate Partner Violence: Not on file    FAMILY HISTORY: Family History  Problem Relation Age of Onset   Diabetes Mother        TYPE 2   Hypertension Mother    Cancer Mother        UTERINE CANCER   Heart disease Father        HEART ATTACK   Migraines Father  Stroke Father     ALLERGIES:  is allergic to penicillins, chlorhexidine  gluconate, and reglan [metoclopramide].  MEDICATIONS:  Current Outpatient Medications  Medication Sig Dispense Refill   ALPRAZolam (XANAX) 0.5 MG tablet Take 0.5 mg by mouth at bedtime as needed for anxiety.     botulinum toxin Type A  (BOTOX ) 100 units SOLR injection Provider to inject 155 units into the muscles of the head and neck every 3 months. Discard remainder. 2 each 3   Cetirizine HCl (ZYRTEC PO) Take 10 mg by mouth daily as needed.     Cholecalciferol (VITAMIN D3 PO) Take 5,000 Int'l Units/L by mouth every other day. Patient state     Dextroamphetamine Sulfate 30 MG TABS Take by mouth. (Patient taking differently: Take 45 mg by mouth daily. 151mg  in am and 30 in afternoon)     fluticasone (FLONASE) 50 MCG/ACT nasal spray  Place 2 sprays into both nostrils as needed.     NONFORMULARY OR COMPOUNDED ITEM Dilitizine Ointment used as needed     oxyCODONE  (OXY IR/ROXICODONE ) 5 MG immediate release tablet Take 1-2 tablets (5-10 mg total) by mouth every 6 (six) hours as needed for severe pain (pain score 7-10). 30 tablet 0   PARoxetine (PAXIL) 30 MG tablet Take 20 mg by mouth daily.     progesterone (PROMETRIUM) 200 MG capsule Take 200 mg by mouth daily.     rizatriptan  (MAXALT -MLT) 10 MG disintegrating tablet Take 1 tablet (10 mg total) by mouth as needed for migraine. May repeat in 2 hours if needed 9 tablet 11   TRAZODONE HCL PO Take 100 mg by mouth at bedtime.     UNABLE TO FIND Biotepellet  inserted every 3 months for hormone replacement     No current facility-administered medications for this visit.    REVIEW OF SYSTEMS:   Constitutional: ( - ) fevers, ( - )  chills , ( - ) night sweats Eyes: ( - ) blurriness of vision, ( - ) double vision, ( - ) watery eyes Ears, nose, mouth, throat, and face: ( - ) mucositis, ( - ) sore throat Respiratory: ( - ) cough, ( - ) dyspnea, ( - ) wheezes Cardiovascular: ( - ) palpitation, ( - ) chest discomfort, ( - ) lower extremity swelling Gastrointestinal:  ( - ) nausea, ( - ) heartburn, ( - ) change in bowel habits Skin: ( - ) abnormal skin rashes Lymphatics: ( - ) new lymphadenopathy, ( - ) easy bruising Neurological: ( - ) numbness, ( - ) tingling, ( - ) new weaknesses Behavioral/Psych: ( - ) mood change, ( - ) new changes  All other systems were reviewed with the patient and are negative.  PHYSICAL EXAMINATION: ECOG PERFORMANCE STATUS: {CHL ONC ECOG PS:[id number]}  There were no vitals filed for this visit. There were no vitals filed for this visit.  GENERAL: alert, no distress and comfortable SKIN: skin color, texture, turgor are normal, no rashes or significant lesions EYES: conjunctiva are pink and non-injected, sclera clear OROPHARYNX: no exudate, no erythema;  lips, buccal mucosa, and tongue normal  NECK: supple, non-tender LYMPH:  no palpable lymphadenopathy in the cervical, axillary or inguinal LUNGS: clear to auscultation and percussion with normal breathing effort HEART: regular rate & rhythm and no murmurs and no lower extremity edema ABDOMEN: soft, non-tender, non-distended, normal bowel sounds Musculoskeletal: no cyanosis of digits and no clubbing  PSYCH: alert & oriented x 3, fluent speech NEURO: no focal motor/sensory deficits  LABORATORY DATA:  I have reviewed the data as listed    Latest Ref Rng & Units 03/20/2024    1:05 PM 09/09/2023    2:13 PM 01/23/2023   11:28 AM  CBC  WBC 4.0 - 10.5 K/uL 6.4  7.4  6.1   Hemoglobin 12.0 - 15.0 g/dL 87.7  86.3  86.3   Hematocrit 36.0 - 46.0 % 37.6  42.5  42.3   Platelets 150 - 400 K/uL 280  238  252.0        Latest Ref Rng & Units 09/09/2023    2:13 PM 01/23/2023   11:28 AM 10/12/2022   10:52 AM  CMP  Glucose 70 - 99 mg/dL 87  88  95   BUN 6 - 20 mg/dL 12  14  13    Creatinine 0.44 - 1.00 mg/dL 9.25  9.26  9.28   Sodium 135 - 145 mmol/L 138  136  138   Potassium 3.5 - 5.1 mmol/L 4.1  4.2  5.2 No hemolysis seen   Chloride 98 - 111 mmol/L 100  99  100   CO2 22 - 32 mmol/L 33  31  32   Calcium  8.9 - 10.3 mg/dL 9.5  9.5  9.8    89.9   Total Protein 6.5 - 8.1 g/dL 6.9  7.0  6.9   Total Bilirubin 0.0 - 1.2 mg/dL 0.4  0.4  0.4   Alkaline Phos 38 - 126 U/L 47  39  35   AST 15 - 41 U/L 16  19  22    ALT 0 - 44 U/L 11  13  20      No results found for: MPROTEIN No results found for: KPAFRELGTCHN, LAMBDASER, KAPLAMBRATIO   BLOOD FILM: *** Review of the peripheral blood smear showed normal appearing white cells with neutrophils that were appropriately lobated and granulated. There was no predominance of bi-lobed or hyper-segmented neutrophils appreciated. No Dohle bodies were noted. There was no left shifting, immature forms or blasts noted. Lymphocytes remain normal in size without any  predominance of large granular lymphocytes. Red cells show no anisopoikilocytosis, macrocytes , microcytes or polychromasia. There were no schistocytes, target cells, echinocytes, acanthocytes, dacrocytes, or stomatocytes.There was no rouleaux formation, nucleated red cells, or intra-cellular inclusions noted. The platelets are normal in size, shape, and color without any clumping evident.  RADIOGRAPHIC STUDIES: I have personally reviewed the radiological images as listed and agreed with the findings in the report. DG Sacrum/Coccyx Result Date: 03/28/2024 CLINICAL DATA:  Hemorrhoid surgery yesterday with rectal pain. EXAM: SACRUM AND COCCYX - 2+ VIEW COMPARISON:  None Available. FINDINGS: No bony or soft tissue abnormalities in the pelvis or sacral region. Visualized pelvic bowel gas is unremarkable. Clips in the left inguinal region from prior inguinal lymph node biopsy. IMPRESSION: No acute findings. Electronically Signed   By: Marcey Moan M.D.   On: 03/28/2024 09:28    ASSESSMENT & PLAN ***  No orders of the defined types were placed in this encounter.   All questions were answered. The patient knows to call the clinic with any problems, questions or concerns.  A total of more than 30 minutes were spent on this encounter with face-to-face time and non-face-to-face time, including preparing to see the patient, ordering tests and/or medications, counseling the patient and coordination of care as outlined above.   Norleen IVAR Kidney, MD Department of Hematology/Oncology [facility] [place] Phone: [phone] Pager: [phone] Email: [email or web address]  04/19/2024 12:31 PM

## 2024-04-20 ENCOUNTER — Inpatient Hospital Stay

## 2024-04-20 ENCOUNTER — Inpatient Hospital Stay: Admitting: Hematology and Oncology

## 2024-04-20 ENCOUNTER — Encounter: Payer: Self-pay | Admitting: Medical Oncology

## 2024-04-20 NOTE — Progress Notes (Signed)
 [facility]  Per Patient's request, rescheduled patient for imaging on 06/15/2024, at 12:30, patient to arrive at 10:30 for contrast and nothing by mouth, except water, 4 hours prior to her 12:30 appt.  Patient gave verbal understanding to instructions. Appointment with Dr. Federico rescheduled to 06/25/2024 to arrive for lab at 10:45 and MD appt at 11:20. Patient confirms appt. Denies questions at this time. Patient encouraged to call with any questions/concerns she may have.   Colene KYM Raider, RN, BSN, [facility] Oncology Nurse Navigator, [facility] 04/20/2024 10:14 AM

## 2024-04-27 ENCOUNTER — Other Ambulatory Visit (HOSPITAL_COMMUNITY): Payer: Self-pay

## 2024-04-27 ENCOUNTER — Other Ambulatory Visit: Payer: Self-pay

## 2024-04-29 ENCOUNTER — Ambulatory Visit: Admitting: Adult Health

## 2024-04-29 ENCOUNTER — Encounter: Payer: Self-pay | Admitting: Oncology

## 2024-04-29 VITALS — BP 108/61 | HR 69

## 2024-04-29 DIAGNOSIS — G43711 Chronic migraine without aura, intractable, with status migrainosus: Secondary | ICD-10-CM

## 2024-04-29 MED ORDER — ONABOTULINUMTOXINA 100 UNITS IJ SOLR
155.0000 [IU] | Freq: Once | INTRAMUSCULAR | Status: AC
  Administered 2024-04-29: 155 [IU] via INTRAMUSCULAR

## 2024-04-29 NOTE — Progress Notes (Signed)
 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

## 2024-04-29 NOTE — Progress Notes (Signed)
 04/29/24: reports that migraines are well controlled with Botox . Usually will get breakthrough headaches when she is due. Reports that botox  is expensive through [facility]. May have to consider referring out in the future.   01/06/24: Migraines are under good control. Neck pain is better with botox  regimin that has cut back on her migraines. Have not had any migraines. 3 moderate headaches since last seen. No aura and no stroke like symptoms with her migraines.   10/09/23: headaches have been good. Reports neck pain was better with botox  but took about 2 weeks to work. Has rizatriptan  but doesn't use. Ice and OTC meds work best for her.  Has a swollen lymph in the groin- she goes next Wednesday 5/8 to have surgery removed. Bisopy showed marginal zone lymphoma.   06/26/23: Botox  working well. No migraines. May get a sinus headaches. Continues to have neck-shoulder pain. Pain worse by the end of the day. Had MRI cervical spine in 2020.  03/20/23: Botox  working well. Shoulders must better this time  12/20/22: Botox  working well. Continues to have neck pain. Seeing sports medicine and doing dry needling. Hurts more on the right than left.   09/18/2022: still doing well > 70% improvement in migraine freq and severity and headache as well. Tightness in neck, tried muscle relaxers, conservative measures, dryneedling: send to dr claudene and see if he can help.     06/19/2022: botox  working well. Reports neck stiffnes and should tightness left > right   BOTOX  PROCEDURE NOTE FOR MIGRAINE HEADACHE    Contraindications and precautions discussed with patient(above). Aseptic procedure was observed and patient tolerated procedure. Procedure performed by Duwaine Russell, NP  The condition has existed for more than 6 months, and pt does not have a diagnosis of ALS, Myasthenia Gravis or Lambert-Eaton Syndrome.  Risks and benefits of injections discussed and pt agrees to proceed with the procedure.  Written consent  obtained  These injections are medically necessary. These injections do not cause sedations or hallucinations which the oral therapies may cause.  Indication/Diagnosis: chronic migraine BOTOX (G9414) injection was performed according to protocol by [employer]. 200 units of BOTOX  was dissolved into 4 cc NS.   NDC: [address]-98  Type of toxin: Botox  Botox - 100 units x 2 vial Lot: [id number]  Expiration: 07/11/2025 NDC: 9976-8854-98   Bacteriostatic 0.9% Sodium Chloride - 4mL total Onu:[id number] Expiration: 04-10-25 NDC: 9590-8033-97   Dx: G43.711         Description of procedure:  The patient was placed in a sitting position. The standard protocol was used for Botox  as follows, with 5 units of Botox  injected at each site:   -Procerus muscle, midline injection  -Corrugator muscle, bilateral injection  -Frontalis muscle, bilateral injection, with 2 sites each side, medial injection was performed in the upper one third of the frontalis muscle, in the region vertical from the medial inferior edge of the superior orbital rim. The lateral injection was again in the upper one third of the forehead vertically above the lateral limbus of the cornea, 1.5 cm lateral to the medial injection site.  -Temporalis muscle injection, 4 sites, bilaterally. The first injection was 3 cm above the tragus of the ear, second injection site was 1.5 cm to 3 cm up from the first injection site in line with the tragus of the ear. The third injection site was 1.5-3 cm forward between the first 2 injection sites. The fourth injection site was 1.5 cm posterior to the second injection site.  -Occipitalis muscle injection, 3  sites, bilaterally. The first injection was done one half way between the occipital protuberance and the tip of the mastoid process behind the ear. The second injection site was done lateral and superior to the first, 1 fingerbreadth from the first injection. The third injection site was 1 fingerbreadth  superiorly and medially from the first injection site.  -Cervical paraspinal muscle injection, 2 sites, bilateral knee first injection site was 1 cm from the midline of the cervical spine, 3 cm inferior to the lower border of the occipital protuberance. The second injection site was 1.5 cm superiorly and laterally to the first injection site.  -Trapezius muscle injection was performed at 3 sites, bilaterally. The first injection site was in the upper trapezius muscle halfway between the inflection point of the neck, and the acromion. The second injection site was one half way between the acromion and the first injection site. The third injection was done between the first injection site and the inflection point of the neck. One injection slighty below the third injection (5 units) bilaterally.   Will return for repeat injection in 3 months.   A 200 units of Botox  was used, 165 units were injected, the rest of the Botox  was wasted. The patient tolerated the procedure well, there were no complications of the above procedure.  Duwaine Russell, MSN, NP-C 04/29/2024, 8:53 AM [facility] [address], [address] [place], [place] [address] [phone]

## 2024-05-08 ENCOUNTER — Encounter: Payer: Self-pay | Admitting: Medical Oncology

## 2024-05-19 ENCOUNTER — Other Ambulatory Visit: Payer: Self-pay

## 2024-06-15 ENCOUNTER — Ambulatory Visit (HOSPITAL_COMMUNITY)
Admission: RE | Admit: 2024-06-15 | Discharge: 2024-06-15 | Disposition: A | Discharge status: Home / Self Care | Source: Ambulatory Visit | Attending: Physician Assistant | Admitting: Physician Assistant

## 2024-06-15 DIAGNOSIS — R590 Localized enlarged lymph nodes: Secondary | ICD-10-CM | POA: Diagnosis present

## 2024-06-15 MED ORDER — IOHEXOL 350 MG/ML SOLN
75.0000 mL | Freq: Once | INTRAVENOUS | Status: AC | PRN
  Administered 2024-06-15: 75 mL via INTRAVENOUS

## 2024-06-15 MED ORDER — IOHEXOL 300 MG/ML  SOLN: 75.0000 mL | Freq: Once | INTRAMUSCULAR | Status: DC | PRN

## 2024-06-15 MED ORDER — IOHEXOL 9 MG/ML PO SOLN
1000.0000 mL | ORAL | Status: AC
  Administered 2024-06-15: 1000 mL via ORAL

## 2024-06-16 ENCOUNTER — Ambulatory Visit: Admitting: Adult Health

## 2024-06-19 ENCOUNTER — Encounter: Payer: Self-pay | Admitting: Medical Oncology

## 2024-06-19 NOTE — Progress Notes (Signed)
 Return call to patient confirming that we received her prescription for extra labs to be collected for her PCP. I confirmed with RN Landry Arabia who stated she provided the script to lab for patient's appointment scheduled on 06/25/2024. Patient denies further questions.  Colene KYM Raider, RN, BSN Oncology Nurse Navigator, [facility] 06/19/2024 1:28 PM

## 2024-06-22 ENCOUNTER — Encounter: Payer: Self-pay | Admitting: *Deleted

## 2024-06-24 ENCOUNTER — Ambulatory Visit: Admitting: Adult Health

## 2024-06-25 ENCOUNTER — Other Ambulatory Visit: Payer: Self-pay | Admitting: Hematology and Oncology

## 2024-06-25 ENCOUNTER — Inpatient Hospital Stay: Admitting: Hematology and Oncology

## 2024-06-25 ENCOUNTER — Inpatient Hospital Stay: Attending: Hematology and Oncology

## 2024-06-25 VITALS — BP 111/82 | HR 115 | Temp 97.8°F | Resp 16 | Wt 103.5 lb

## 2024-06-25 DIAGNOSIS — Z8719 Personal history of other diseases of the digestive system: Secondary | ICD-10-CM | POA: Diagnosis not present

## 2024-06-25 DIAGNOSIS — R590 Localized enlarged lymph nodes: Secondary | ICD-10-CM | POA: Insufficient documentation

## 2024-06-25 DIAGNOSIS — Z0000 Encounter for general adult medical examination without abnormal findings: Secondary | ICD-10-CM

## 2024-06-25 LAB — CBC WITH DIFFERENTIAL (CANCER CENTER ONLY)
Abs Immature Granulocytes: 0.01 K/uL (ref 0.00–0.07)
Basophils Absolute: 0.1 K/uL (ref 0.0–0.1)
Basophils Relative: 1 %
Eosinophils Absolute: 0.2 K/uL (ref 0.0–0.5)
Eosinophils Relative: 4 %
HCT: 42.5 % (ref 36.0–46.0)
Hemoglobin: 13.5 g/dL (ref 12.0–15.0)
Immature Granulocytes: 0 %
Lymphocytes Relative: 26 %
Lymphs Abs: 1.3 K/uL (ref 0.7–4.0)
MCH: 26.2 pg (ref 26.0–34.0)
MCHC: 31.8 g/dL (ref 30.0–36.0)
MCV: 82.5 fL (ref 80.0–100.0)
Monocytes Absolute: 0.4 K/uL (ref 0.1–1.0)
Monocytes Relative: 8 %
Neutro Abs: 3 K/uL (ref 1.7–7.7)
Neutrophils Relative %: 61 %
Platelet Count: 292 K/uL (ref 150–400)
RBC: 5.15 MIL/uL — ABNORMAL HIGH (ref 3.87–5.11)
RDW: 14.3 % (ref 11.5–15.5)
WBC Count: 5 K/uL (ref 4.0–10.5)
nRBC: 0 % (ref 0.0–0.2)

## 2024-06-25 LAB — CMP (CANCER CENTER ONLY)
ALT: 11 U/L (ref 0–44)
AST: 18 U/L (ref 15–41)
Albumin: 4.8 g/dL (ref 3.5–5.0)
Alkaline Phosphatase: 51 U/L (ref 38–126)
Anion gap: 9 (ref 5–15)
BUN: 13 mg/dL (ref 6–20)
CO2: 31 mmol/L (ref 22–32)
Calcium: 10.1 mg/dL (ref 8.9–10.3)
Chloride: 97 mmol/L — ABNORMAL LOW (ref 98–111)
Creatinine: 0.93 mg/dL (ref 0.44–1.00)
GFR, Estimated: >60 mL/min
Glucose, Bld: 84 mg/dL (ref 70–99)
Potassium: 4.5 mmol/L (ref 3.5–5.1)
Sodium: 137 mmol/L (ref 135–145)
Total Bilirubin: 0.3 mg/dL (ref 0.0–1.2)
Total Protein: 7.6 g/dL (ref 6.5–8.1)

## 2024-06-25 LAB — LACTATE DEHYDROGENASE: LDH: 170 U/L (ref 105–235)

## 2024-06-25 NOTE — Progress Notes (Signed)
 " [facility] [phone]   [phone]  PROGRESS NOTE  Patient Care Team: Tisovec, Charlie ORN, MD as PCP - General (Internal Medicine) Ladona Heinz, MD as PCP - Cardiology (Cardiology)  Hematological/Oncological History # Pelvic Lymphadenopathy   Interval History:  [name] [name] 59 y.o. female with medical history significant for a reactive lymph node status post resection who presents for a follow up visit. The patient's last visit was on 09/09/2023 at which time she establish care. In the interim since the last visit she had a inguinal lymph node resection performed on 10/16/2023 which showed no clear evidence of malignancy, though there was atypical marginal zone hyperplasia.  On exam today [name] [name] reports that she has been well overall in the interim since our last visit.  She reports that she tolerated the lymph node surgery well and does not have any residual side effects.  She also notes that she underwent hemorrhoid surgery on 03/27/2024 as well.  She reports that she is no longer having any hemorrhoidal bleeding.  She reports that she otherwise feels quite good with strong appetite and good energy.  She has no evidence of lymphadenopathy elsewhere.  She denies any fevers, chills, sweats, nausea, vomiting or diarrhea.  She denies any weight loss.  A full 10 point ROS is otherwise negative.  MEDICAL HISTORY:  Past Medical History:  Diagnosis Date   Anxiety    Brain fog    Chronic pain    COMPLEX REGIONAL PAIN SYNDROME   Hemorrhoids    History of neck problems    dry needling being done & PT   Leg cramping 2020   right, says sometimes she has a hard time putting pressure on that leg    Lymphoma (HCC)    Migraine headache    Sarcoidosis     SURGICAL HISTORY: Past Surgical History:  Procedure Laterality Date   CHOLECYSTECTOMY     DILATION AND CURETTAGE OF UTERUS     ESOPHAGEAL MANOMETRY N/A 05/05/2018   Procedure: ESOPHAGEAL MANOMETRY  (EM);  Surgeon: Rosalie Kitchens, MD;  Location: [facility];  Service: Endoscopy;  Laterality: N/A;   LYMPH NODE BIOPSY Left 10/16/2023   Procedure: LYMPH NODE BIOPSY;  Surgeon: Aron Shoulders, MD;  Location: [facility];  Service: General;  Laterality: Left;  LEFT INGUINAL LYMPH NODE EXCISIONAL BIOPSY   PERINEOPLASTY     had scarring after her delivery   SHOULDER ARTHROSCOPY Left 06/11/2006   THORACOSCOPY Left    TONSILLECTOMY AND ADENOIDECTOMY     AT AGE 53   TRANSANAL HEMORRHOIDAL DEARTERIALIZATION N/A 03/27/2024   Procedure: TRANSANAL HEMORRHOIDAL DEARTERIALIZATION;  Surgeon: Debby Hila, MD;  Location: [facility];  Service: General;  Laterality: N/A;   WISDOM TOOTH EXTRACTION      SOCIAL HISTORY: Social History   Socioeconomic History   Marital status: Married    Spouse name: Not on file   Number of children: Not on file   Years of education: Not on file   Highest education level: Master's degree (e.g., MA, MS, MEng, MEd, MSW, MBA)  Occupational History   Not on file  Tobacco Use   Smoking status: Never   Smokeless tobacco: Never  Vaping Use   Vaping status: Never Used  Substance and Sexual Activity   Alcohol  use: Yes    Alcohol /week: 1.0 standard drink of alcohol     Types: 1 Standard drinks or equivalent per week    Comment: occasionally   Drug use: No  Sexual activity: Yes    Partners: Male  Other Topics Concern   Not on file  Social History Narrative   Lives at home with husband and daughter   Right handed   Caffeine: 1 cup/day   Social Drivers of Health   Tobacco Use: Low Risk  (04/28/2024)   Received from [facility]   Patient History    Smoking Tobacco Use: Never    Smokeless Tobacco Use: Never    Passive Exposure: Not on file  Financial Resource Strain: Not on file  Food Insecurity: Not on file  Transportation Needs: Not on file  Physical Activity: Not on file  Stress: Not on file  Social Connections: Not on file   Intimate Partner Violence: Not on file  Depression (EYV7-0): Not on file  Alcohol  Screen: Not on file  Housing: Unknown (09/02/2023)   Received from [facility]   Epic    Unable to Pay for Housing in the Last Year: Not on file    Number of Times Moved in the Last Year: Not on file    At any time in the past 12 months, were you homeless or living in a shelter (including now)?: No  Utilities: Not on file  Health Literacy: Not on file    FAMILY HISTORY: Family History  Problem Relation Age of Onset   Diabetes Mother        TYPE 2   Hypertension Mother    Cancer Mother        UTERINE CANCER   Heart disease Father        HEART ATTACK   Migraines Father    Stroke Father     ALLERGIES:  is allergic to penicillins, chlorhexidine  gluconate, and reglan [metoclopramide].  MEDICATIONS:  Current Outpatient Medications  Medication Sig Dispense Refill   ALPRAZolam (XANAX) 0.5 MG tablet Take 0.5 mg by mouth at bedtime as needed for anxiety.     botulinum toxin Type A  (BOTOX ) 100 units SOLR injection Provider to inject 155 units into the muscles of the head and neck every 3 months. Discard remainder. 2 each 3   Cetirizine HCl (ZYRTEC PO) Take 10 mg by mouth daily as needed.     Cholecalciferol (VITAMIN D3 PO) Take 5,000 Int'l Units/L by mouth every other day. Patient state     Dextroamphetamine Sulfate 30 MG TABS Take by mouth. (Patient taking differently: Take 40 mg by mouth.)     fluticasone (FLONASE) 50 MCG/ACT nasal spray Place 2 sprays into both nostrils as needed.     NONFORMULARY OR COMPOUNDED ITEM Dilitizine Ointment used as needed     pantoprazole (PROTONIX) 40 MG tablet Take 40 mg by mouth daily.     PARoxetine (PAXIL) 30 MG tablet Take 20 mg by mouth daily.     progesterone (PROMETRIUM) 200 MG capsule Take 200 mg by mouth daily.     rizatriptan  (MAXALT -MLT) 10 MG disintegrating tablet Take 1 tablet (10 mg total) by mouth as needed for migraine. May repeat in 2  hours if needed 9 tablet 11   TRAZODONE HCL PO Take 100 mg by mouth at bedtime.     UNABLE TO FIND Biotepellet  inserted every 3 months for hormone replacement     No current facility-administered medications for this visit.    REVIEW OF SYSTEMS:   Constitutional: ( - ) fevers, ( - )  chills , ( - ) night sweats Eyes: ( - ) blurriness of vision, ( - )  double vision, ( - ) watery eyes Ears, nose, mouth, throat, and face: ( - ) mucositis, ( - ) sore throat Respiratory: ( - ) cough, ( - ) dyspnea, ( - ) wheezes Cardiovascular: ( - ) palpitation, ( - ) chest discomfort, ( - ) lower extremity swelling Gastrointestinal:  ( - ) nausea, ( - ) heartburn, ( - ) change in bowel habits Skin: ( - ) abnormal skin rashes Lymphatics: ( - ) new lymphadenopathy, ( - ) easy bruising Neurological: ( - ) numbness, ( - ) tingling, ( - ) new weaknesses Behavioral/Psych: ( - ) mood change, ( - ) new changes  All other systems were reviewed with the patient and are negative.  PHYSICAL EXAMINATION: Vitals:   06/25/24 1123  BP: 111/82  Pulse: (!) 115  Resp: 16  Temp: 97.8 F (36.6 C)  SpO2: 100%   Filed Weights   06/25/24 1123  Weight: 103 lb 8 oz (46.9 kg)    GENERAL: Well-appearing middle-age Caucasian female, alert, no distress and comfortable SKIN: skin color, texture, turgor are normal, no rashes or significant lesions EYES: conjunctiva are pink and non-injected, sclera clear LUNGS: clear to auscultation and percussion with normal breathing effort HEART: regular rate & rhythm and no murmurs and no lower extremity edema Musculoskeletal: no cyanosis of digits and no clubbing  PSYCH: alert & oriented x 3, fluent speech NEURO: no focal motor/sensory deficits  LABORATORY DATA:  I have reviewed the data as listed    Latest Ref Rng & Units 06/25/2024   11:00 AM 03/20/2024    1:05 PM 09/09/2023    2:13 PM  CBC  WBC 4.0 - 10.5 K/uL 5.0  6.4  7.4   Hemoglobin 12.0 - 15.0 g/dL 86.4  87.7  86.3    Hematocrit 36.0 - 46.0 % 42.5  37.6  42.5   Platelets 150 - 400 K/uL 292  280  238        Latest Ref Rng & Units 06/25/2024   11:00 AM 09/09/2023    2:13 PM 01/23/2023   11:28 AM  CMP  Glucose 70 - 99 mg/dL 84  87  88   BUN 6 - 20 mg/dL 13  12  14    Creatinine 0.44 - 1.00 mg/dL 9.06  9.25  9.26   Sodium 135 - 145 mmol/L 137  138  136   Potassium 3.5 - 5.1 mmol/L 4.5  4.1  4.2   Chloride 98 - 111 mmol/L 97  100  99   CO2 22 - 32 mmol/L 31  33  31   Calcium  8.9 - 10.3 mg/dL 89.8  9.5  9.5   Total Protein 6.5 - 8.1 g/dL 7.6  6.9  7.0   Total Bilirubin 0.0 - 1.2 mg/dL 0.3  0.4  0.4   Alkaline Phos 38 - 126 U/L 51  47  39   AST 15 - 41 U/L 18  16  19    ALT 0 - 44 U/L 11  11  13       RADIOGRAPHIC STUDIES: CT CHEST ABDOMEN PELVIS W CONTRAST Result Date: 06/23/2024 CLINICAL DATA:  Recheck pelvic lymphadenopathy, abnormal pathology without clear evidence of lymphoma * Tracking Code: BO * EXAM: CT CHEST, ABDOMEN, AND PELVIS WITH CONTRAST TECHNIQUE: Multidetector CT imaging of the chest, abdomen and pelvis was performed following the standard protocol during bolus administration of intravenous contrast. RADIATION DOSE REDUCTION: This exam was performed according to the departmental dose-optimization program which includes automated exposure control,  adjustment of the mA and/or kV according to patient size and/or use of iterative reconstruction technique. CONTRAST:  75mL OMNIPAQUE  IOHEXOL  350 MG/ML SOLN COMPARISON:  PET-CT, 09/25/2023, CT chest, 10/22/2017 FINDINGS: CT CHEST FINDINGS Cardiovascular: Scattered aortic atherosclerosis. 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 Normal heart size. No pericardial effusion. Mediastinum/Nodes: No enlarged mediastinal, hilar, or axillary lymph nodes. Thyroid  gland, trachea, and esophagus demonstrate no significant findings. Lungs/Pleura: Nodule in the right lower lobe measuring 0.4 cm, unchanged when compared to  examination dated 2019 and benign (series 4, image 114). No pleural effusion or pneumothorax. Musculoskeletal: No chest wall abnormality. No acute osseous findings. CT ABDOMEN PELVIS FINDINGS Hepatobiliary: No solid liver abnormality is seen. Cholecystectomy. Mild postoperative biliary ductal dilatation. Pancreas: Unremarkable. No pancreatic ductal dilatation or surrounding inflammatory changes. Spleen: Normal in size without significant abnormality. Adrenals/Urinary Tract: Adrenal glands are unremarkable. Kidneys are normal, without renal calculi, solid lesion, or hydronephrosis. Bladder is unremarkable. Stomach/Bowel: Stomach is within normal limits. Appendix appears normal. No evidence of bowel wall thickening, distention, or inflammatory changes. Large burden of stool throughout the colon and rectum. Vascular/Lymphatic: No significant vascular findings are present. Surgical excision of a previously seen large left inguinal lymph node no other enlarged lymph nodes in the abdomen or pelvis. Reproductive: No mass or other abnormality. Other: No abdominal wall hernia or abnormality. No ascites. Musculoskeletal: No acute osseous findings. IMPRESSION: 1. Surgical excision of a previously seen large hypermetabolic left inguinal lymph node. No other enlarged lymph nodes in the chest, abdomen, or pelvis. 2. No evidence of mass or metastatic disease in the chest, abdomen, or pelvis. 3. Large burden of stool throughout the colon and rectum. Aortic Atherosclerosis (ICD10-I70.0). Electronically Signed   By: Marolyn JONETTA Jaksch M.D.   On: 06/23/2024 07:35    ASSESSMENT & PLAN MADYLYN INSCO 59 y.o. female with medical history significant for a reactive lymph node status post resection who presents for a follow up visit.   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 -- Last CT scan on 06/16/2023 showed surgical excision of a previously seen large hypermetabolic left inguinal lymph node with no other clear sites of lymphadenopathy in the chest, abdomen, or pelvis. -- Labs today show white blood cell 5.0, hemoglobin 13.5, MCV 82.5, platelets 292. -- Recommend return to clinic in 1 years time to reevaluate.  If no evidence of lymphadenopathy at time could discharge.  Recommend she return to us  promptly if she develop new lymphadenopathy.  Orders Placed This Encounter  Procedures   Lipid panel    Standing Status:   Future    Expiration Date:   06/25/2025    All questions were answered. The patient knows to call the clinic with any problems, questions or concerns.  A total of more than 30 minutes were spent on this encounter with face-to-face time and non-face-to-face time, including preparing to see the patient, ordering tests and/or medications, counseling the patient and coordination of care as outlined above.   Norleen IVAR Kidney, MD Department of Hematology/Oncology [facility] Phone: [phone] Pager: [phone] Email: [email or web address]  06/30/2024 1:20 PM  "

## 2024-06-26 ENCOUNTER — Encounter: Payer: Self-pay | Admitting: Hematology and Oncology

## 2024-08-03 ENCOUNTER — Ambulatory Visit

## 2024-08-03 ENCOUNTER — Ambulatory Visit: Admitting: Adult Health

## 2025-06-25 ENCOUNTER — Inpatient Hospital Stay

## 2025-06-25 ENCOUNTER — Inpatient Hospital Stay: Admitting: Hematology and Oncology
